# Patient Record
Sex: Female | Born: 1935 | Race: Black or African American | Hispanic: No
Health system: Southern US, Community
[De-identification: ages and names within clinical notes are randomized; demographics above are authoritative.]

## PROBLEM LIST (undated history)

## (undated) DIAGNOSIS — F0150 Vascular dementia without behavioral disturbance: Secondary | ICD-10-CM

## (undated) DIAGNOSIS — I10 Essential (primary) hypertension: Secondary | ICD-10-CM

## (undated) DIAGNOSIS — E785 Hyperlipidemia, unspecified: Secondary | ICD-10-CM

## (undated) DIAGNOSIS — N3941 Urge incontinence: Secondary | ICD-10-CM

## (undated) DIAGNOSIS — E119 Type 2 diabetes mellitus without complications: Secondary | ICD-10-CM

## (undated) DIAGNOSIS — I639 Cerebral infarction, unspecified: Secondary | ICD-10-CM

## (undated) HISTORY — DX: Vascular dementia without behavioral disturbance: F01.50

## (undated) HISTORY — DX: Cerebral infarction, unspecified: I63.9

## (undated) HISTORY — DX: Urge incontinence: N39.41

## (undated) HISTORY — DX: Hyperlipidemia, unspecified: E78.5

---

## 2002-11-11 ENCOUNTER — Emergency Department (HOSPITAL_COMMUNITY): Admission: EM | Admit: 2002-11-11 | Discharge: 2002-11-11 | Payer: Self-pay | Admitting: Emergency Medicine

## 2002-12-07 ENCOUNTER — Encounter: Admission: RE | Admit: 2002-12-07 | Discharge: 2003-01-04 | Payer: Self-pay | Admitting: Orthopedic Surgery

## 2003-01-18 ENCOUNTER — Encounter: Admission: RE | Admit: 2003-01-18 | Discharge: 2003-03-18 | Payer: Self-pay | Admitting: Orthopedic Surgery

## 2007-10-09 ENCOUNTER — Emergency Department (HOSPITAL_COMMUNITY): Admission: EM | Admit: 2007-10-09 | Discharge: 2007-10-09 | Payer: Self-pay | Admitting: Emergency Medicine

## 2009-07-16 ENCOUNTER — Emergency Department (HOSPITAL_COMMUNITY): Admission: EM | Admit: 2009-07-16 | Discharge: 2009-07-16 | Payer: Self-pay | Admitting: Emergency Medicine

## 2010-02-03 ENCOUNTER — Encounter: Payer: Self-pay | Admitting: Internal Medicine

## 2016-02-07 DIAGNOSIS — Z6829 Body mass index (BMI) 29.0-29.9, adult: Secondary | ICD-10-CM | POA: Diagnosis not present

## 2016-02-07 DIAGNOSIS — K08409 Partial loss of teeth, unspecified cause, unspecified class: Secondary | ICD-10-CM | POA: Diagnosis not present

## 2016-02-07 DIAGNOSIS — Z0000 Encounter for general adult medical examination without abnormal findings: Secondary | ICD-10-CM | POA: Diagnosis not present

## 2016-02-07 DIAGNOSIS — I10 Essential (primary) hypertension: Secondary | ICD-10-CM | POA: Diagnosis not present

## 2016-02-07 DIAGNOSIS — G3184 Mild cognitive impairment, so stated: Secondary | ICD-10-CM | POA: Diagnosis not present

## 2016-02-07 DIAGNOSIS — H18413 Arcus senilis, bilateral: Secondary | ICD-10-CM | POA: Diagnosis not present

## 2017-05-13 ENCOUNTER — Emergency Department (HOSPITAL_COMMUNITY): Payer: Medicare HMO

## 2017-05-13 ENCOUNTER — Inpatient Hospital Stay (HOSPITAL_COMMUNITY)
Admission: EM | Admit: 2017-05-13 | Discharge: 2017-05-17 | DRG: 069 | Disposition: A | Discharge status: Skilled Nursing Facility | Payer: Medicare HMO | Attending: Internal Medicine | Admitting: Internal Medicine

## 2017-05-13 ENCOUNTER — Encounter (HOSPITAL_COMMUNITY): Payer: Self-pay

## 2017-05-13 DIAGNOSIS — Z9114 Patient's other noncompliance with medication regimen: Secondary | ICD-10-CM

## 2017-05-13 DIAGNOSIS — R410 Disorientation, unspecified: Secondary | ICD-10-CM | POA: Diagnosis not present

## 2017-05-13 DIAGNOSIS — E1122 Type 2 diabetes mellitus with diabetic chronic kidney disease: Secondary | ICD-10-CM | POA: Diagnosis present

## 2017-05-13 DIAGNOSIS — E876 Hypokalemia: Secondary | ICD-10-CM | POA: Diagnosis not present

## 2017-05-13 DIAGNOSIS — R1312 Dysphagia, oropharyngeal phase: Secondary | ICD-10-CM | POA: Diagnosis not present

## 2017-05-13 DIAGNOSIS — R531 Weakness: Secondary | ICD-10-CM

## 2017-05-13 DIAGNOSIS — R2689 Other abnormalities of gait and mobility: Secondary | ICD-10-CM | POA: Diagnosis not present

## 2017-05-13 DIAGNOSIS — M4802 Spinal stenosis, cervical region: Secondary | ICD-10-CM | POA: Diagnosis not present

## 2017-05-13 DIAGNOSIS — G459 Transient cerebral ischemic attack, unspecified: Principal | ICD-10-CM | POA: Diagnosis present

## 2017-05-13 DIAGNOSIS — Z743 Need for continuous supervision: Secondary | ICD-10-CM | POA: Diagnosis not present

## 2017-05-13 DIAGNOSIS — N183 Chronic kidney disease, stage 3 unspecified: Secondary | ICD-10-CM | POA: Diagnosis present

## 2017-05-13 DIAGNOSIS — G8194 Hemiplegia, unspecified affecting left nondominant side: Secondary | ICD-10-CM | POA: Diagnosis not present

## 2017-05-13 DIAGNOSIS — I129 Hypertensive chronic kidney disease with stage 1 through stage 4 chronic kidney disease, or unspecified chronic kidney disease: Secondary | ICD-10-CM | POA: Diagnosis present

## 2017-05-13 DIAGNOSIS — I6300 Cerebral infarction due to thrombosis of unspecified precerebral artery: Secondary | ICD-10-CM | POA: Diagnosis not present

## 2017-05-13 DIAGNOSIS — I361 Nonrheumatic tricuspid (valve) insufficiency: Secondary | ICD-10-CM | POA: Diagnosis not present

## 2017-05-13 DIAGNOSIS — I10 Essential (primary) hypertension: Secondary | ICD-10-CM | POA: Diagnosis present

## 2017-05-13 DIAGNOSIS — R41841 Cognitive communication deficit: Secondary | ICD-10-CM | POA: Diagnosis not present

## 2017-05-13 DIAGNOSIS — G9341 Metabolic encephalopathy: Secondary | ICD-10-CM | POA: Diagnosis present

## 2017-05-13 DIAGNOSIS — E1140 Type 2 diabetes mellitus with diabetic neuropathy, unspecified: Secondary | ICD-10-CM | POA: Diagnosis not present

## 2017-05-13 DIAGNOSIS — I63311 Cerebral infarction due to thrombosis of right middle cerebral artery: Secondary | ICD-10-CM | POA: Diagnosis not present

## 2017-05-13 DIAGNOSIS — R279 Unspecified lack of coordination: Secondary | ICD-10-CM | POA: Diagnosis not present

## 2017-05-13 DIAGNOSIS — E1159 Type 2 diabetes mellitus with other circulatory complications: Secondary | ICD-10-CM | POA: Diagnosis not present

## 2017-05-13 DIAGNOSIS — E1165 Type 2 diabetes mellitus with hyperglycemia: Secondary | ICD-10-CM | POA: Diagnosis not present

## 2017-05-13 DIAGNOSIS — I639 Cerebral infarction, unspecified: Secondary | ICD-10-CM

## 2017-05-13 DIAGNOSIS — M6281 Muscle weakness (generalized): Secondary | ICD-10-CM | POA: Diagnosis not present

## 2017-05-13 DIAGNOSIS — M5023 Other cervical disc displacement, cervicothoracic region: Secondary | ICD-10-CM | POA: Diagnosis not present

## 2017-05-13 DIAGNOSIS — E1121 Type 2 diabetes mellitus with diabetic nephropathy: Secondary | ICD-10-CM | POA: Diagnosis not present

## 2017-05-13 HISTORY — DX: Type 2 diabetes mellitus without complications: E11.9

## 2017-05-13 HISTORY — DX: Essential (primary) hypertension: I10

## 2017-05-13 LAB — URINALYSIS, ROUTINE W REFLEX MICROSCOPIC
Glucose, UA: NEGATIVE mg/dL
Hgb urine dipstick: NEGATIVE
Ketones, ur: 20 mg/dL — AB
Nitrite: NEGATIVE
Protein, ur: 100 mg/dL — AB
Specific Gravity, Urine: 1.025 (ref 1.005–1.030)
pH: 5 (ref 5.0–8.0)

## 2017-05-13 LAB — CBC WITH DIFFERENTIAL/PLATELET
Basophils Absolute: 0 10*3/uL (ref 0.0–0.1)
Basophils Relative: 0 %
Eosinophils Absolute: 0 10*3/uL (ref 0.0–0.7)
Eosinophils Relative: 0 %
HCT: 37.5 % (ref 36.0–46.0)
Hemoglobin: 12.1 g/dL (ref 12.0–15.0)
Lymphocytes Relative: 25 %
Lymphs Abs: 1.7 10*3/uL (ref 0.7–4.0)
MCH: 26.2 pg (ref 26.0–34.0)
MCHC: 32.3 g/dL (ref 30.0–36.0)
MCV: 81.3 fL (ref 78.0–100.0)
Monocytes Absolute: 0.4 10*3/uL (ref 0.1–1.0)
Monocytes Relative: 7 %
Neutro Abs: 4.5 10*3/uL (ref 1.7–7.7)
Neutrophils Relative %: 68 %
Platelets: 224 10*3/uL (ref 150–400)
RBC: 4.61 MIL/uL (ref 3.87–5.11)
RDW: 14.5 % (ref 11.5–15.5)
WBC: 6.7 10*3/uL (ref 4.0–10.5)

## 2017-05-13 LAB — BASIC METABOLIC PANEL
Anion gap: 14 (ref 5–15)
BUN: 28 mg/dL — ABNORMAL HIGH (ref 6–20)
CO2: 24 mmol/L (ref 22–32)
Calcium: 10 mg/dL (ref 8.9–10.3)
Chloride: 102 mmol/L (ref 101–111)
Creatinine, Ser: 1.2 mg/dL — ABNORMAL HIGH (ref 0.44–1.00)
GFR calc Af Amer: 46 mL/min — ABNORMAL LOW (ref >60)
GFR calc non Af Amer: 39 mL/min — ABNORMAL LOW (ref >60)
Glucose, Bld: 246 mg/dL — ABNORMAL HIGH (ref 65–99)
Potassium: 3.2 mmol/L — ABNORMAL LOW (ref 3.5–5.1)
Sodium: 140 mmol/L (ref 135–145)

## 2017-05-13 LAB — I-STAT TROPONIN, ED: Troponin i, poc: 0.04 ng/mL (ref 0.00–0.08)

## 2017-05-13 MED ORDER — SODIUM CHLORIDE 0.9 % IV BOLUS
500.0000 mL | Freq: Once | INTRAVENOUS | Status: AC
  Administered 2017-05-14: 500 mL via INTRAVENOUS

## 2017-05-13 NOTE — ED Provider Notes (Signed)
TIME SEEN: 11:29 PM  CHIEF COMPLAINT: Weakness  HPI: Patient is an 84 year old female with history of hypertension, diabetes who has been medically noncompliant off medications for the past 5 years who presents to the emergency department with left-sided weakness that family noticed today.  Family noticed her favoring her left arm and had difficulty getting herself up and walking due to left leg weakness.  They felt that she was confused around noon today.  Daughter reports she last saw her about a week ago when she was normal.  Patient denies any recent fevers, cough, vomiting.  Did have some diarrhea today after lunch.  No chest pain or shortness of breath.  No headache or head injury.  No previous history of stroke.  ROS: See HPI Constitutional: no fever  Eyes: no drainage  ENT: no runny nose   Cardiovascular:  no chest pain  Resp: no SOB  GI: no vomiting GU: no dysuria Integumentary: no rash  Allergy: no hives  Musculoskeletal: no leg swelling  Neurological: no slurred speech ROS otherwise negative  PAST MEDICAL HISTORY/PAST SURGICAL HISTORY:  Past Medical History:  Diagnosis Date  . Diabetes mellitus without complication (HCC)   . Hypertension     MEDICATIONS:  Prior to Admission medications   Not on File    ALLERGIES:  No Known Allergies  SOCIAL HISTORY:  Social History   Tobacco Use  . Smoking status: Never Smoker  . Smokeless tobacco: Never Used  Substance Use Topics  . Alcohol use: Not on file    FAMILY HISTORY: History reviewed. No pertinent family history.  EXAM: BP (!) 157/76 (BP Location: Right Arm)   Pulse 82   Temp 97.8 F (36.6 C) (Oral)   Resp 17   Ht 5' (1.524 m)   SpO2 99%  CONSTITUTIONAL: Alert and oriented and responds appropriately to questions.  Elderly HEAD: Normocephalic EYES: Conjunctivae clear, pupils appear equal, EOMI ENT: normal nose; moist mucous membranes NECK: Supple, no meningismus, no nuchal rigidity, no LAD  CARD: RRR;  S1 and S2 appreciated; no murmurs, no clicks, no rubs, no gallops RESP: Normal chest excursion without splinting or tachypnea; breath sounds clear and equal bilaterally; no wheezes, no rhonchi, no rales, no hypoxia or respiratory distress, speaking full sentences ABD/GI: Normal bowel sounds; non-distended; soft, non-tender, no rebound, no guarding, no peritoneal signs, no hepatosplenomegaly BACK:  The back appears normal and is non-tender to palpation, there is no CVA tenderness EXT: Normal ROM in all joints; non-tender to palpation; no edema; normal capillary refill; no cyanosis, no calf tenderness or swelling    SKIN: Normal color for age and race; warm; no rash NEURO: Moves all extremities equally, strength diminished in the left upper and lower extremity compared to the right, cranial nerves II through XII intact, normal speech, oriented x3 PSYCH: The patient's mood and manner are appropriate. Grooming and personal hygiene are appropriate.  MEDICAL DECISION MAKING: Patient here with left-sided weakness.  Last seen normal by family was 1 week ago.  No history of previous stroke.  Stroke scale is 2.  Outside TPA window.  Labs obtained in triage unremarkable other than mildly elevated creatinine and some ketones in her urine.  Will give IV fluids for mild dehydration.  Head CT negative.  EKG shows no ischemic abnormality or arrhythmia.  I feel she will need admission for MRI of her brain and neurologic evaluation.  Concern for possible stroke.  ED PROGRESS:   12:20 AM  D/w Dr. Wilford Corner with neurology who  will see patient in consult.  Will admit to medicine.   12:50 AM Discussed patient's case with hospitalist, Dr. Willette Pa.  I have recommended admission and patient (and family if present) agree with this plan. Admitting physician will place admission orders.   I reviewed all nursing notes, vitals, pertinent previous records, EKGs, lab and urine results, imaging (as available).     EKG  Interpretation  Date/Time:  Tuesday May 13 2017 19:39:38 EDT Ventricular Rate:  91 PR Interval:  158 QRS Duration: 74 QT Interval:  382 QTC Calculation: 469 R Axis:   -12 Text Interpretation:  Normal sinus rhythm Minimal voltage criteria for LVH, may be normal variant Inferior infarct , age undetermined Anterior infarct , age undetermined Abnormal ECG No significant change since last tracing Artifact Confirmed by Rochele Raring [phone]) on 05/13/2017 11:15:14 PM         Ward, Layla Maw, DO 05/14/17 1191

## 2017-05-13 NOTE — ED Notes (Signed)
Pt. Currently in CT

## 2017-05-13 NOTE — ED Provider Notes (Signed)
Patient placed in Quick Look pathway, seen and evaluated   Chief Complaint: Weakness.   HPI:   84 y.o. F in by granddaughter for evaluation of weakness.  Granddaughter states that she picked up patient at approximately 12 PM today.  Granddaughter unsure when patient was last normal and she has not seen her in the last few days.  She states that since then, patient has had difficulty walking and using her left lower extremity.  Additionally, she noticed some swelling of the left hand and noticed that patient was not using the left hand as she normally would.  Granddaughter reports that at baseline, patient can bill it without any difficulty.  She reports that patient has been required more assistance today with regular activities which she states is abnormal also.  Patient denies any complaints at this time.  Patient denies any chest pain, difficulty breathing, numbness of arms or legs, vision changes.  ROS: Numbness  Physical Exam:   Gen: No distress  Neuro: Awake and Alert  Skin: Warm    Focused Exam:   Neuro:  Cranial nerves III-XII intact  Follows commands, Moves all extremities   5/5 strength to RUE and BLE.  Slightly diminished strength noted to left upper extremity.  Sensation intact throughout all major nerve distributions  Normal finger to nose.  No dysdiadochokinesia.  No pronator drift.  No slurred speech. No facial droop.   Given that patient's symptoms have been going on 12 PM and we do not know last seen normal, does not qualify for code stroke at this time.  Initiation of care has begun. The patient has been counseled on the process, plan, and necessity for staying for the completion/evaluation, and the remainder of the medical screening examination.   Maxwell Caul, PA-C 05/13/17 2016    Marily Memos, MD 05/14/17 1610

## 2017-05-13 NOTE — ED Triage Notes (Signed)
Pt presents with onset of weakness noted by granddaughter today. Pt normally able to walk but today has had to have assistance. Granddaughter reports confusion.

## 2017-05-14 ENCOUNTER — Observation Stay (HOSPITAL_COMMUNITY): Payer: Medicare HMO

## 2017-05-14 ENCOUNTER — Other Ambulatory Visit: Payer: Self-pay

## 2017-05-14 ENCOUNTER — Observation Stay (HOSPITAL_BASED_OUTPATIENT_CLINIC_OR_DEPARTMENT_OTHER): Payer: Medicare HMO

## 2017-05-14 ENCOUNTER — Encounter (HOSPITAL_COMMUNITY): Payer: Self-pay | Admitting: Internal Medicine

## 2017-05-14 DIAGNOSIS — M5023 Other cervical disc displacement, cervicothoracic region: Secondary | ICD-10-CM | POA: Diagnosis not present

## 2017-05-14 DIAGNOSIS — I10 Essential (primary) hypertension: Secondary | ICD-10-CM | POA: Diagnosis present

## 2017-05-14 DIAGNOSIS — R410 Disorientation, unspecified: Secondary | ICD-10-CM | POA: Diagnosis not present

## 2017-05-14 DIAGNOSIS — M4802 Spinal stenosis, cervical region: Secondary | ICD-10-CM | POA: Diagnosis not present

## 2017-05-14 DIAGNOSIS — I639 Cerebral infarction, unspecified: Secondary | ICD-10-CM

## 2017-05-14 DIAGNOSIS — G9341 Metabolic encephalopathy: Secondary | ICD-10-CM | POA: Diagnosis present

## 2017-05-14 DIAGNOSIS — E876 Hypokalemia: Secondary | ICD-10-CM | POA: Diagnosis present

## 2017-05-14 DIAGNOSIS — I63311 Cerebral infarction due to thrombosis of right middle cerebral artery: Secondary | ICD-10-CM | POA: Diagnosis not present

## 2017-05-14 DIAGNOSIS — I361 Nonrheumatic tricuspid (valve) insufficiency: Secondary | ICD-10-CM

## 2017-05-14 DIAGNOSIS — R531 Weakness: Secondary | ICD-10-CM

## 2017-05-14 DIAGNOSIS — E1159 Type 2 diabetes mellitus with other circulatory complications: Secondary | ICD-10-CM | POA: Diagnosis present

## 2017-05-14 DIAGNOSIS — E1122 Type 2 diabetes mellitus with diabetic chronic kidney disease: Secondary | ICD-10-CM | POA: Diagnosis present

## 2017-05-14 DIAGNOSIS — I6300 Cerebral infarction due to thrombosis of unspecified precerebral artery: Secondary | ICD-10-CM

## 2017-05-14 DIAGNOSIS — N183 Chronic kidney disease, stage 3 (moderate): Secondary | ICD-10-CM

## 2017-05-14 HISTORY — DX: Cerebral infarction, unspecified: I63.9

## 2017-05-14 LAB — CBC
HCT: 36.8 % (ref 36.0–46.0)
Hemoglobin: 11.8 g/dL — ABNORMAL LOW (ref 12.0–15.0)
MCH: 26.2 pg (ref 26.0–34.0)
MCHC: 32.1 g/dL (ref 30.0–36.0)
MCV: 81.6 fL (ref 78.0–100.0)
PLATELETS: 206 10*3/uL (ref 150–400)
RBC: 4.51 MIL/uL (ref 3.87–5.11)
RDW: 14.2 % (ref 11.5–15.5)
WBC: 6.9 10*3/uL (ref 4.0–10.5)

## 2017-05-14 LAB — LIPID PANEL
Cholesterol: 242 mg/dL — ABNORMAL HIGH (ref 0–200)
HDL: 47 mg/dL (ref >40)
LDL CALC: 175 mg/dL — AB (ref 0–99)
Total CHOL/HDL Ratio: 5.1 RATIO
Triglycerides: 99 mg/dL (ref <150)
VLDL: 20 mg/dL (ref 0–40)

## 2017-05-14 LAB — HEMOGLOBIN A1C
Hgb A1c MFr Bld: 6.5 % — ABNORMAL HIGH (ref 4.8–5.6)
Mean Plasma Glucose: 139.85 mg/dL

## 2017-05-14 LAB — GLUCOSE, CAPILLARY
GLUCOSE-CAPILLARY: 134 mg/dL — AB (ref 65–99)
Glucose-Capillary: 130 mg/dL — ABNORMAL HIGH (ref 65–99)
Glucose-Capillary: 117 mg/dL — ABNORMAL HIGH (ref 65–99)

## 2017-05-14 LAB — CREATININE, SERUM
Creatinine, Ser: 1.22 mg/dL — ABNORMAL HIGH (ref 0.44–1.00)
GFR calc Af Amer: 45 mL/min — ABNORMAL LOW (ref >60)
GFR calc non Af Amer: 39 mL/min — ABNORMAL LOW (ref >60)

## 2017-05-14 LAB — ECHOCARDIOGRAM COMPLETE
Height: 60 in
WEIGHTICAEL: 2074.09 [oz_av]

## 2017-05-14 MED ORDER — INSULIN ASPART 100 UNIT/ML ~~LOC~~ SOLN
0.0000 [IU] | Freq: Three times a day (TID) | SUBCUTANEOUS | Status: DC
  Administered 2017-05-14 – 2017-05-16 (×5): 2 [IU] via SUBCUTANEOUS

## 2017-05-14 MED ORDER — SODIUM CHLORIDE 0.9 % IV SOLN
INTRAVENOUS | Status: DC
  Administered 2017-05-14 (×2): via INTRAVENOUS

## 2017-05-14 MED ORDER — ATORVASTATIN CALCIUM 40 MG PO TABS
40.0000 mg | ORAL_TABLET | Freq: Every day | ORAL | Status: DC
  Administered 2017-05-14 – 2017-05-15 (×2): 40 mg via ORAL
  Filled 2017-05-14 (×2): qty 1

## 2017-05-14 MED ORDER — ATORVASTATIN CALCIUM 10 MG PO TABS: 10.0000 mg | ORAL_TABLET | Freq: Every day | ORAL | Status: DC

## 2017-05-14 MED ORDER — STROKE: EARLY STAGES OF RECOVERY BOOK
Freq: Once | Status: AC
  Administered 2017-05-14: 04:00:00
  Administered 2017-05-15: 1
  Filled 2017-05-14: qty 1

## 2017-05-14 MED ORDER — SENNOSIDES-DOCUSATE SODIUM 8.6-50 MG PO TABS: 1.0000 | ORAL_TABLET | Freq: Every evening | ORAL | Status: DC | PRN

## 2017-05-14 MED ORDER — ASPIRIN 325 MG PO TABS
325.0000 mg | ORAL_TABLET | Freq: Every day | ORAL | Status: DC
  Administered 2017-05-14 – 2017-05-17 (×4): 325 mg via ORAL
  Filled 2017-05-14 (×3): qty 1

## 2017-05-14 MED ORDER — INSULIN ASPART 100 UNIT/ML ~~LOC~~ SOLN
3.0000 [IU] | Freq: Three times a day (TID) | SUBCUTANEOUS | Status: DC
  Administered 2017-05-14 – 2017-05-17 (×10): 3 [IU] via SUBCUTANEOUS

## 2017-05-14 MED ORDER — ACETAMINOPHEN 650 MG RE SUPP: 650.0000 mg | RECTAL | Status: DC | PRN

## 2017-05-14 MED ORDER — ACETAMINOPHEN 160 MG/5ML PO SOLN: 650.0000 mg | ORAL | Status: DC | PRN

## 2017-05-14 MED ORDER — ENOXAPARIN SODIUM 40 MG/0.4ML ~~LOC~~ SOLN
40.0000 mg | SUBCUTANEOUS | Status: DC
  Administered 2017-05-14 – 2017-05-16 (×3): 40 mg via SUBCUTANEOUS
  Filled 2017-05-14 (×3): qty 0.4

## 2017-05-14 MED ORDER — ACETAMINOPHEN 325 MG PO TABS: 650.0000 mg | ORAL_TABLET | ORAL | Status: DC | PRN

## 2017-05-14 NOTE — ED Notes (Signed)
Patient transported to MRI 

## 2017-05-14 NOTE — Evaluation (Signed)
Occupational Therapy Evaluation Patient Details Name: Mary Walters MRN: [id number] DOB: Dec 09, 1928 Today's Date: 05/14/2017    History of Present Illness 84 y.o. female with medical history significant of diabetes and hypertension who presents to the ED on 4/30 brought in by her daughter and grandchildren due to new left-sided leg weakness and confusion. 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 At baseline pt reports being independent with ADLs and functional mobility, reports she lives alone. Pt requiring minA for functional mobility this session using HHA and RW; requires MinA for toileting and LB ADLs. Pt presenting with LUE deficits, is easily distracted and perseverative during session, requiring frequent cues for redirection to task; pt also requiring cues for initiating functional tasks and transfers. Pt will benefit from continued acute OT services and recommend additional OT services in SNF setting prior to return home to maximize pt's overall safety and independence with ADLs and mobility.      Follow Up Recommendations  SNF;Supervision/Assistance - 24 hour    Equipment Recommendations  Other (comment)(TBD in next venue )           Precautions / Restrictions Precautions Precautions: Fall Restrictions Weight Bearing Restrictions: No      Mobility Bed Mobility Overal bed mobility: Needs Assistance Bed Mobility: Supine to Sit     Supine to sit: Min guard     General bed mobility comments: increased time to perform, multiple cues required to direct to task. no physical assist needed  Transfers Overall transfer level: Needs assistance Equipment used: 1 person hand held assist Transfers: Sit to/from Stand Sit to Stand: Min assist         General transfer comment: min assist with noted instability upon standing    Balance  Overall balance assessment: Needs assistance   Sitting balance-Leahy Scale: Fair Sitting balance - Comments: able to sit EOB but noted posterior bias requring multiple cues to come to EOB Postural control: Posterior lean   Standing balance-Leahy Scale: Poor Standing balance comment: relaince on physical assist and upper extremity support             High level balance activites: Direction changes High Level Balance Comments: assist for truns min to moderate assist           ADL either performed or assessed with clinical judgement   ADL Overall ADL's : Needs assistance/impaired Eating/Feeding: Set up;Sitting   Grooming: Wash/dry hands;Min guard;Minimal assistance;Standing Grooming Details (indicate cue type and reason): cues for initiating and terminating task  Upper Body Bathing: Min guard;Sitting   Lower Body Bathing: Minimal assistance;Sit to/from stand   Upper Body Dressing : Min guard;Sitting   Lower Body Dressing: Minimal assistance;Sit to/from stand Lower Body Dressing Details (indicate cue type and reason): pt able to reach and adjust socks seated EOB, increased time  Toilet Transfer: Minimal assistance;Ambulation;Regular Toilet;Grab bars;RW Statistician Details (indicate cue type and reason): pt with decreased initiation, standing in front of toilet but requiring max cues and repetition to sit on toilet  Toileting- Clothing Manipulation and Hygiene: Minimal assistance;Sit to/from stand Toileting - Clothing Manipulation Details (indicate cue type and reason): steadying assist in standing while pt performs peri-care      Functional mobility during ADLs: Min guard;Minimal assistance;Rolling walker General ADL Comments: pt requires cues due to decreased initiation and easily distracted/perseverative; questionable visual deficits/inattention; use of HHA in room for mobility  and RW when entering hallway for further distance      Vision Baseline Vision/History: No  visual deficits Patient Visual Report: No change from baseline Vision Assessment?: Vision impaired- to be further tested in functional context Additional Comments: to be further assessed; pt bumping into counter with RW/hand when rounding corners, requiring cues to avoid, though doing so to both L/R side at different times                Pertinent Vitals/Pain Pain Assessment: No/denies pain     Hand Dominance Left   Extremity/Trunk Assessment Upper Extremity Assessment Upper Extremity Assessment: Generalized weakness;LUE deficits/detail RUE Coordination: decreased fine motor;decreased gross motor LUE Deficits / Details: LUE with noted weakness and sensory deficits compared to RUE LUE Sensation: decreased light touch LUE Coordination: decreased fine motor;decreased gross motor   Lower Extremity Assessment Lower Extremity Assessment: Defer to PT evaluation LLE Deficits / Details: overall generalized weakness but symmetrical strength LLE Sensation: decreased light touch   Cervical / Trunk Assessment Cervical / Trunk Assessment: Kyphotic   Communication Communication Communication: No difficulties   Cognition Arousal/Alertness: Awake/alert Behavior During Therapy: WFL for tasks assessed/performed Overall Cognitive Status: Impaired/Different from baseline Area of Impairment: Attention;Memory;Awareness;Problem solving;Safety/judgement;Following commands                   Current Attention Level: Selective Memory: Decreased short-term memory Following Commands: Follows multi-step commands inconsistently   Awareness: Emergent Problem Solving: Requires verbal cues;Requires tactile cues General Comments: easily distracted and tangential during activitiy. perseverative throughout session   General Comments                  Home Living Family/patient expects to be discharged to:: Private residence Living Arrangements: Alone Available Help at Discharge:  Family;Available PRN/intermittently(around most of the time ) Type of Home: Apartment Home Access: Level entry;Elevator           Bathroom Shower/Tub: Tub/shower unit;Curtain   Bathroom Toilet: Standard     Home Equipment: None          Prior Functioning/Environment Level of Independence: Independent        Comments: reports living alone, no assistive device does her own cooking etc. questionable historian as no famiyl or caregiver present to confirm        OT Problem List: Decreased strength;Impaired balance (sitting and/or standing);Decreased cognition;Decreased activity tolerance;Decreased knowledge of use of DME or AE;Decreased safety awareness      OT Treatment/Interventions: DME and/or AE instruction;Therapeutic activities;Balance training;Self-care/ADL training;Therapeutic exercise;Patient/family education;Visual/perceptual remediation/compensation;Cognitive remediation/compensation    OT Goals(Current goals can be found in the care plan section) Acute Rehab OT Goals Patient Stated Goal: regain independence OT Goal Formulation: With patient Time For Goal Achievement: 05/28/17 Potential to Achieve Goals: Good  OT Frequency: Min 2X/week               Co-evaluation PT/OT/SLP Co-Evaluation/Treatment: Yes Reason for Co-Treatment: For patient/therapist safety;To address functional/ADL transfers   OT goals addressed during session: ADL's and self-care;Proper use of Adaptive equipment and DME      AM-PAC PT "6 Clicks" Daily Activity     Outcome Measure Help from another person eating meals?: None Help from another person taking care of personal grooming?: A Little Help from another person toileting, which includes using toliet, bedpan, or urinal?: A Little Help from another person bathing (including washing, rinsing, drying)?: A Lot Help from another person to put on and taking off regular upper body clothing?: A Little Help from another person  to put on and  taking off regular lower body clothing?: A Lot 6 Click Score: 17   End of Session Equipment Utilized During Treatment: Gait belt;Rolling walker Nurse Communication: Mobility status  Activity Tolerance: Patient tolerated treatment well Patient left: in chair;with call bell/phone within reach;with chair alarm set  OT Visit Diagnosis: Unsteadiness on feet (R26.81);Muscle weakness (generalized) (M62.81);Other symptoms and signs involving cognitive function                Time: 1025-1055 OT Time Calculation (min): 30 min Charges:  OT General Charges $OT Visit: 1 Visit OT Evaluation $OT Eval Moderate Complexity: 1 Mod G-Codes:     Marcy Siren, OT Pager [phone] 05/14/2017  Orlando Penner 05/14/2017, 1:24 PM

## 2017-05-14 NOTE — Consult Note (Addendum)
Neurology Consultation  Reason for Consult: Left-sided weakness Referring Physician: Dr. Leonides Schanz  CC: Confusion, weakness  History is obtained from: Chart, patient's grandchildren at bedside, patient  HPI: Mary Walters is a 84 y.o. female who has not seen a doctor in many years has a past medical history of diabetes and hypertension currently not on any medications, who was brought in by her daughter, who saw the patient today and noticed that she had difficulty using her left arm and difficulty ambulating. Patient's daughter had spoken with the EDP prior to this consultation was called and was not at the bedside at the time of this encounter.  HER-2 grandchildren, 1 of whom works in the hospital, were present at bedside to provide some history.  They do not live with the patient.  She lives alone. She reports that she has had difficulty with using the left side of her body for many many months and always feels like she needs support on the left side and doing things like getting out of the car and going on stairs. Family members report that she was last seen normal by the family about a week ago.  They could not tell me a clear last known well time. The patient was picked up by her daughter today who noticed that she has been having trouble with the left side of her body and also that she appeared confused.  This change was noted around noon today, but again she had not been seen by family for about a week, so there is no clear last known well. The patient at this time denies any tingling or numbness.  She denies any headache or visual changes.  She denies any chest pain, palpitations, shortness of breath, nausea or vomiting.  She denies any preceding flulike illnesses or fevers or chills.  She denies any urinary frequency urgency.  She denies any GI upsets.  She does admit to not drinking as much water as she should. She says she is not the one to take medications and has not seen a doctor in at  least 5 years.  She was at one point on antihypertensives but has not taken medications for many many years now. Of note, the grandson notes that the patient has had problems with short-term memory for many years now although her long-term memory seems intact.  LKW: At least 7 days prior to presentation tpa given?: no, outside the window Premorbid modified Rankin scale (mRS): 2  ROS: Review of systems was performed and is negative except as noted in the HPI.  Past Medical History:  Diagnosis Date  . Diabetes mellitus without complication ([facility])   . Hypertension    History reviewed. No pertinent family history.   Social History:   reports that she has never smoked. She has never used smokeless tobacco. Her alcohol and drug histories are not on file. No alcohol, tobacco or illicit drug use.  Medications  Current Facility-Administered Medications:  .  sodium chloride 0.9 % bolus 500 mL, 500 mL, Intravenous, Once, Ward, Kristen N, DO, Last Rate: 500 mL/hr at 05/14/17 0025, 500 mL at 05/14/17 0025 No current outpatient medications on file. not on any medications.  Does not take antiplatelets or statins.  Exam: Current vital signs: BP (!) 144/76   Pulse 88   Temp 97.8 F (36.6 C) (Oral)   Resp (!) 27   Ht 5' (1.524 m)   SpO2 100%  Vital signs in last 24 hours: Temp:  [97.8 F (36.6  C)-99.2 F (37.3 C)] 97.8 F (36.6 C) (04/30 2318) Pulse Rate:  [74-94] 88 (05/01 0000) Resp:  [14-27] 27 (05/01 0000) BP: (144-157)/(72-98) 144/76 (05/01 0000) SpO2:  [96 %-100 %] 100 % (05/01 0000)  GENERAL: Awake, alert in NAD HEENT: - Normocephalic and atraumatic, dry mm, no LN++, no Thyromegally.  Poor oral hygiene secondary to poor dentition. LUNGS - Clear to auscultation bilaterally with no wheezes CV - S1S2 RRR, no m/r/g, equal pulses bilaterally. ABDOMEN - Soft, nontender, nondistended with normoactive BS Ext: warm, well perfused, intact peripheral pulses, trace edema left lower  extremity.  Poorly kempt and overgrown nails in both feet. NEURO:  Mental Status: Patient is awake, alert, oriented to place, time and person. Language: speech is mildly dysarthric.  Naming, repetition, fluency, and comprehension intact.  Attention and concentration is mildly reduced.  She keeps perseverating the fact that she has had problems with the left side of her body and always keeps looking for support on that side. Cranial Nerves: PERRL. EOMI, visual fields full, no facial asymmetry, facial sensation intact, hearing intact, tongue/uvula/soft palate midline, normal sternocleidomastoid and trapezius muscle strength. No evidence of tongue atrophy or fibrillations Motor: 4/5 left upper and left lower extremity with minimal vertical drift.  4+/5 right upper and right lower extremity with no vertical drift. Tone: is normal and bulk is normal Sensation- Intact to light touch bilaterally Coordination: FTN intact bilaterally, no ataxia in BLE. Gait- deferred  NIHSS 1a Level of Conscious.: 0 1b LOC Questions: 0 1c LOC Commands: 0 2 Best Gaze: 0 3 Visual: 0 4 Facial Palsy: 0 5a Motor Arm - left: 1 5b Motor Arm - Right: 0 6a Motor Leg - Left: 1 6b Motor Leg - Right: 0 7 Limb Ataxia: 0 8 Sensory: 0 9 Best Language: 0 10 Dysarthria: 1 11 Extinct. and Inatten.: 0 TOTAL: 3  Labs I have reviewed labs in epic and the results pertinent to this consultation are: Elevated glucose, BUN and creatinine.  Reduced GFR.  CBC    Component Value Date/Time   WBC 6.7 05/13/2017 2031   RBC 4.61 05/13/2017 2031   HGB 12.1 05/13/2017 2031   HCT 37.5 05/13/2017 2031   PLT 224 05/13/2017 2031   MCV 81.3 05/13/2017 2031   MCH 26.2 05/13/2017 2031   MCHC 32.3 05/13/2017 2031   RDW 14.5 05/13/2017 2031   LYMPHSABS 1.7 05/13/2017 2031   MONOABS 0.4 05/13/2017 2031   EOSABS 0.0 05/13/2017 2031   BASOSABS 0.0 05/13/2017 2031    CMP     Component Value Date/Time   NA 140 05/13/2017 2031   K 3.2  (L) 05/13/2017 2031   CL 102 05/13/2017 2031   CO2 24 05/13/2017 2031   GLUCOSE 246 (H) 05/13/2017 2031   BUN 28 (H) 05/13/2017 2031   CREATININE 1.20 (H) 05/13/2017 2031   CALCIUM 10.0 05/13/2017 2031   GFRNONAA 39 (L) 05/13/2017 2031   GFRAA 46 (L) 05/13/2017 2031     Imaging I have reviewed the images obtained:  CT-scan of the brain-no acute changes.  Age-indeterminate lacunar in the right thalamus.  Chronic white matter changes.  Assessment:  A very pleasant 84 year old woman, who has not seen a physician in many years, has a past history of diabetes and hypertension currently not on any medications, brought in when family got concerned for confusion and weakness on the left side. Best last seen well at least a week ago, hence not a candidate for IV TPA.  Also no  cortical signs on exam, hence not a candidate for endovascular treatment. On examination, she has mild left hemiparesis with no facial involvement and no sensory involvement. Family also provided some history of cognitive decline over the past few months to years, predominantly involving short-term memory. Her exam is not very convincing for an acute stroke given the history and the exam but she does have some left hemiparesis which could be from a lacunar infarct given her risk factors including age, diabetes and hypertension-not on treatment. She did exhibit some signs of perseveration on exam making MCI versus dementia also in the differential. Currently, I would recommend she be admitted for stroke risk factor work-up, and also for possible toxic metabolic encephalopathy.  Impression: Evaluate for stroke/TIA-likely small vessel etiology Evaluate for toxic metabolic encephalopathy due to deranged renal function Evaluate for memory loss  Recommendations: -Admit to hospitalist -Telemetry -2D echo -MRI of the brain without contrast -MRA head and neck without contrast given to range renal function -Frequent  neurochecks -Aspirin 325 p.o. daily -Atorvastatin 80 mg p.o. Daily -Lipid panel, A1c -Check vitamin B12, TSH and RPR as reversible causes for memory loss.  Full dementia evaluation should be done as an outpatient upon discharge. -PT, OT, speech therapy. -N.p.o. unless passes swallow evaluation -Management of toxic metabolic derangements per primary team.  Please page stroke NP/PA/MD (listed on AMION)  from 8am-4 pm as this patient will be followed by the stroke team at this point.  -- Amie Portland, MD [facility] Pager: [phone] If 7pm to 7am, please call on call as listed on AMION.

## 2017-05-14 NOTE — H&P (Signed)
History and Physical    Mary Walters:[id number] DOB: 18-Mar-1928 DOA: 05/13/2017  PCP: No primary care provider on file.  She has not seen a physician in 5 years Patient coming from: Home  I have personally briefly reviewed patient's old medical records in [facility]  Chief Complaint: Left-sided weakness noted at noon today  HPI: Mary Walters is a 84 y.o. female with medical history significant of diabetes and hypertension who has been off her medications for about 5 years because she "heard that they may not help" her presents the emergency department brought in by her daughter and grandchildren due to new left-sided leg weakness and confusion.  The patient was last seen well about a week ago when according to her daughter she was able to ambulate adequately.  At noon today she was picked up by her grandchild to go to lunch and she seemed a little bit confused she was unable to get up and required assistance for that.  She has had some left-sided deficits including weakness of the lower extremity and upper extremity.  She denies any nausea vomiting diarrhea or constipation.  She denies any blurry vision or double vision.  He has significant concerns about requiring assistance to help her get out of the car.  She would like some sort of supportive or assistive device.  She feels that she is having great difficulty moving around.  Running to her all of the symptoms have come on gradually but the family seems to think that there has been a significant change today.  Denies any history of syncope, she has no prior history of atrial fibrillation denies any history of heart attacks or prior strokes.  The patient lives alone.  She denies numbness or tingling.  Headache or visual changes.  She has no chest pain or palpitations no shortness of breath no nausea or vomiting.  She reports not getting a flu shot but did not have any flulike illnesses or fever or chills recently.  Has no urinary  frequency or urgency.  She feels that she could drink more water and does not drink as much as she should but has no history of nausea vomiting or diarrhea recently.  Grandson reports difficulty with her short-term memory.  Her memory appears to be intact.  ED Course: In the emergency department the patient was fully evaluated a CT scan was obtained did not show any acute or subacute stroke and no acute abnormality.  She had mildly progressive atrophy and chronic small vessel white matter ischemic changes.  She was referred to neurology who felt the patient would benefit from admission for stroke evaluation.   Review of Systems: As per HPI otherwise all other systems reviewed and  negative.   Past Medical History:  Diagnosis Date  . Diabetes mellitus without complication (HCC)   . Hypertension     History reviewed. No pertinent surgical history.   reports that she has never smoked. She has never used smokeless tobacco. She reports that she drinks about 1.2 oz of alcohol per week. She reports that she does not use drugs.  No Known Allergies  Family History  Problem Relation Age of Onset  . Hypertension Mother     Medications prior to admission: None in 5 years       Physical Exam:  Constitutional: NAD, calm, comfortable Vitals:   05/13/17 2318 05/13/17 2330 05/13/17 2345 05/14/17 0000  BP: (!) 157/76 (!) 152/72 (!) 146/76 (!) 144/76  Pulse: 82 88  74 88  Resp: 17 14 (!) 23 (!) 27  Temp: 97.8 F (36.6 C)     TempSrc: Oral     SpO2: 99% 99% 100% 100%  Height:       Eyes: PERRL, lids and conjunctivae normal ENMT: Mucous membranes are moist. Posterior pharynx clear of any exudate or lesions.poor dentition with multiple missing teeth Neck: normal, supple, no masses, no thyromegaly Respiratory: clear to auscultation bilaterally, no wheezing, no crackles. Normal respiratory effort. No accessory muscle use.  Cardiovascular: Regular rate and rhythm, no murmurs / rubs / gallops. No  extremity edema. 2+ pedal pulses. No carotid bruits.  Abdomen: no tenderness, no masses palpated. No hepatosplenomegaly. Bowel sounds positive.  Musculoskeletal: no clubbing / cyanosis. No joint deformity upper and lower extremities. Good ROM, no contractures. Normal muscle tone.  Skin: no rashes, lesions, ulcers. No induration Neurologic: CN 2-12 grossly intact. Sensation intact, DTR normal.  4/5 on the right side 3/5 on the left side both upper and lower extremities Psychiatric: Normal judgment and insight. Alert and oriented x 3. Normal mood.   Labs on Admission: I have personally reviewed following labs and imaging studies  CBC: Recent Labs  Lab 05/13/17 2031  WBC 6.7  NEUTROABS 4.5  HGB 12.1  HCT 37.5  MCV 81.3  PLT 224   Basic Metabolic Panel: Recent Labs  Lab 05/13/17 2031  NA 140  K 3.2*  CL 102  CO2 24  GLUCOSE 246*  BUN 28*  CREATININE 1.20*  CALCIUM 10.0   Urine analysis:    Component Value Date/Time   COLORURINE AMBER (A) 05/13/2017 2015   APPEARANCEUR CLOUDY (A) 05/13/2017 2015   LABSPEC 1.025 05/13/2017 2015   PHURINE 5.0 05/13/2017 2015   GLUCOSEU NEGATIVE 05/13/2017 2015   HGBUR NEGATIVE 05/13/2017 2015   BILIRUBINUR SMALL (A) 05/13/2017 2015   KETONESUR 20 (A) 05/13/2017 2015   PROTEINUR 100 (A) 05/13/2017 2015   NITRITE NEGATIVE 05/13/2017 2015   LEUKOCYTESUR TRACE (A) 05/13/2017 2015    Radiological Exams on Admission: Ct Head Wo Contrast  Result Date: 05/13/2017 CLINICAL DATA:  New onset generalized weakness today. EXAM: CT HEAD WITHOUT CONTRAST TECHNIQUE: Contiguous axial images were obtained from the base of the skull through the vertex without intravenous contrast. COMPARISON:  10/09/2007. FINDINGS: Brain: Diffusely enlarged ventricles and subarachnoid spaces. Patchy white matter low density in both cerebral hemispheres. No intracranial hemorrhage, mass lesion or CT evidence of acute infarction. Vascular: No hyperdense vessel or unexpected  calcification. Skull: Normal. Negative for fracture or focal lesion. Sinuses/Orbits: No acute finding. Other: None. IMPRESSION: 1. No acute abnormality. 2. Mildly progressive atrophy and chronic small vessel white matter ischemic changes. Electronically Signed   By: Beckie Salts M.D.   On: 05/13/2017 21:15    EKG: Independently reviewed.  Sinus rhythm with PVCs and LVH and possible inferior and anterior lateral age-indeterminate infarctions.  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  Stroke: Will evaluate for stroke with bilateral carotid Dopplers, MRI, and echocardiogram.  A lipid profile has been ordered and is pending as is a hemoglobin A1c.  I have started aspirin 325 mg p.o. daily as well as low-dose atorvastatin at 10 mg a day given patient's age.  2.  Acute metabolic encephalopathy: Given the patient's hypokalemia and her hyperglycemia this may be causing her to  have some weakness on the left side.  We will replete potassium and get her sugars under control and hopefully can improve the functioning of her left side if this is not in fact a stroke.  3.  Uncontrolled type 2 diabetes mellitus with nephropathy: Patient with a GFR of only 32.  She has stage III chronic kidney disease.  She certainly benefit from better blood glucose control both from a renal standpoint and from a standpoint of protecting her retina.  We will start her on sliding scale insulin coverage and based on her amount of insulin she is requiring we will consider insulin versus oral medications.  We will also use a carbohydrate controlled diet once patient is able to eat and is cleared by speech therapy.  4.  Hypokalemia: This will be repleted both IV with her IV fluids and orally will recheck in a.m.  5.  Hypertension  complicating diabetes: We will currently allow for permissive hypertension.  Will follow blood pressures closely if blood pressures become markedly elevated greater than 190 will consider low-dose medications to bring blood pressures closer to the 170 range.  Patient will likely need blood pressure medications at discharge.    DVT prophylaxis: Lovenox Code Status: Full code Family Communication: Spoke with patient's daughter, 2 granddaughters and grandson all who were present at the time of admission. Disposition Plan: Likely home in 48 hours Consults called: Dr. Jerrell Belfast neurology Admission status: Observation  Lahoma Crocker MD FACP [facility] Pager [phone]  If 7PM-7AM, please contact night-coverage [email or web address] Password [email or web address]  05/14/2017, 1:35 AM

## 2017-05-14 NOTE — Progress Notes (Signed)
Echocardiogram 2D Echocardiogram has been performed.  05/14/2017 1:12 PM Gertie Fey, BS, RVT, RDCS, RDMS

## 2017-05-14 NOTE — Care Management Note (Signed)
Case Management Note  Patient Details  Name: Mary Walters MRN: [id number] Date of Birth: 02/01/1928  Subjective/Objective:    Pt in to r/o CVA. She is from home alone.                Action/Plan: OT recommending SNF. Awaiting PT recs. CM following for d/c disposition.  Expected Discharge Date:  05/15/17               Expected Discharge Plan:  Skilled Nursing Facility  In-House Referral:  Clinical Social Work  Discharge planning Services     Post Acute Care Choice:    Choice offered to:     DME Arranged:    DME Agency:     HH Arranged:    HH Agency:     Status of Service:  In process, will continue to follow  If discussed at Long Length of Stay Meetings, dates discussed:    Additional Comments:  Kermit Balo, RN 05/14/2017, 2:31 PM

## 2017-05-14 NOTE — Progress Notes (Signed)
Carotid duplex prelim: 1-39% ICA stenosis. TCD completed.  Ronin Crager Eunice, RDMS, RVT  

## 2017-05-14 NOTE — Evaluation (Signed)
Speech Language Pathology Evaluation Patient Details Name: Mary Walters MRN: [id number] DOB: [birth year] Today's Date: 05/14/2017 Time: 4098-1191 SLP Time Calculation (min) (ACUTE ONLY): 18 min  Problem List:  Patient Active Problem List   Diagnosis Date Noted  . 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 Hypertension complicating diabetes (HCC) 05/14/2017  . Hypokalemia 05/14/2017  . Acute metabolic encephalopathy 05/14/2017  . Left-sided weakness    Past Medical History:  Past Medical History:  Diagnosis Date  . Diabetes mellitus without complication (HCC)   . Hypertension    Past Surgical History: History reviewed. No pertinent surgical history. HPI:  Mary Walters a 84 y.o.femalewith medical history significant ofdiabetes and hypertension who has been off her medications for about 5 years because she "heard that they may not help" herpresents the emergency department brought in by her daughter and grandchildren due to new left-sided leg weakness and confusion. The patient was last seen well about a week ago when according to her daughter she was able to ambulate adequately. At noon today she was picked up by her grandchild to go to lunch and she seemed a little bit confused she was unable to get up and required assistance for that. She has had some left-sided deficits including weakness of the lower extremity and upper extremity. She denies any nausea vomiting diarrhea or constipation. She denies any blurry vision or double vision. He has significant concerns about requiring assistance to help her get out of the car. She would like some sort of supportive or assistive device. She feels that she is having great difficulty moving around. Running to her all of the symptoms have come on gradually but the family seems to think that there has been a significant change today. Denies any history of  syncope, she has no prior history of atrial fibrillation denies any history of heart attacks or prior strokes. The patient lives alone. She denies numbness or tingling. Headache or visual changes. She has no chest pain or palpitations no shortness of breath no nausea or vomiting. She reports not getting a flu shot but did not have any flulike illnesses or fever or chills recently. Has no urinary frequency or urgency. She feels that she could drink more water and does not drink as much as she should but has no history of nausea vomiting or diarrhea recently. Grandson reports difficulty with her short-term memory. Her memory appears to be intact. 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   Assessment / Plan / Recommendation Clinical Impression  Pt presents with moderate cognitive impairments impacting attention (easily distracted), problem solving, task initiation, intellectual awareness/safety awareness and would benefit from continued acute ST services with SNF recommendation to increase cognitive function and thereby increase safety awareness. This would maximize pt's overall functional independence and reduce caregiver burden.     SLP Assessment  SLP Recommendation/Assessment: Patient needs continued Speech Lanaguage Pathology Services SLP Visit Diagnosis: Cognitive communication deficit (R41.841)    Follow Up Recommendations  Skilled Nursing facility    Frequency and Duration min 2x/week  2 weeks      SLP Evaluation Cognition  Overall Cognitive Status: Impaired/Different from baseline Arousal/Alertness: Awake/alert Orientation Level: Oriented X4 Attention: Selective Selective Attention: Impaired Selective Attention Impairment: Verbal basic;Functional basic Memory: Impaired Memory Impairment: Storage deficit;Decreased recall of new information;Decreased short term memory Decreased  Short Term Memory: Verbal basic;Functional basic Awareness: Impaired Awareness  Impairment: Intellectual impairment;Emergent impairment Problem Solving: Impaired Problem Solving Impairment: Verbal basic;Functional basic Executive Function: Decision Making Decision Making: Impaired Decision Making Impairment: Verbal basic;Functional basic Behaviors: Perseveration;Poor frustration tolerance;Restless Safety/Judgment: Impaired       Comprehension  Auditory Comprehension Overall Auditory Comprehension: Appears within functional limits for tasks assessed Visual Recognition/Discrimination Discrimination: Not tested Reading Comprehension Reading Status: Not tested    Expression Expression Primary Mode of Expression: Verbal Verbal Expression Overall Verbal Expression: Appears within functional limits for tasks assessed Written Expression Dominant Hand: Left Written Expression: Not tested   Oral / Motor  Oral Motor/Sensory Function Overall Oral Motor/Sensory Function: Within functional limits Motor Speech Overall Motor Speech: Appears within functional limits for tasks assessed   GO                    Shiasia Porro 05/14/2017, 2:19 PM

## 2017-05-14 NOTE — Progress Notes (Signed)
Admitted via stretcher from ED after CT & MRI.  Alert, oriented, no pain, speech clear, left arm weak but has grip, no drift.  Oriented to unit, room, plan of care & safety precautions.  Initiated stroke education.

## 2017-05-14 NOTE — Progress Notes (Signed)
Stroke Team Progress Note  Mary Walters is a 84 y.o. female who has not seen a doctor in many years has a past medical history of diabetes and hypertension currently not on any medications, who was brought in by her daughter, who saw the patient today and noticed that she had difficulty using her left arm and difficulty ambulating. Patient's daughter had spoken with the EDP prior to this consultation was called and was not at the bedside at the time of this encounter.  HER-2 grandchildren, 1 of whom works in the hospital, were present at bedside to provide some history.  They do not live with the patient.  She lives alone. She reports that she has had difficulty with using the left side of her body for many many months and always feels like she needs support on the left side and doing things like getting out of the car and going on stairs. Family members report that she was last seen normal by the family about a week ago.  They could not tell me a clear last known well time. The patient was picked up by her daughter today who noticed that she has been having trouble with the left side of her body and also that she appeared confused.  This change was noted around noon today, but again she had not been seen by family for about a week, so there is no clear last known well. The patient at this time denies any tingling or numbness.  She denies any headache or visual changes.  She denies any chest pain, palpitations, shortness of breath, nausea or vomiting.  She denies any preceding flulike illnesses or fevers or chills.  She denies any urinary frequency urgency.  She denies any GI upsets.  She does admit to not drinking as much water as she should. She says she is not the one to take medications and has not seen a doctor in at least 5 years.  She was at one point on antihypertensives but has not taken medications for many many years now. Of note, the grandson notes that the patient has had problems with  short-term memory for many years now although her long-term memory seems intact.  LKW: At least 7 days prior to presentation tpa given?: no, outside the window Premorbid modified Rankin scale (mRS): 2   SUBJECTIVE  patient is alone in the room. She states she is having some memory difficulties over the past few years. She's been having some difficulty walking with leaning to the left side and her family was concerned and brought her to the hospital. She denies any sudden weakness and speech difficulties or known prior history of strokes. Denies significant neck pain or radicular pain. She does admit to a few falls but denies severe neck pain  OBJECTIVE Most recent Vital Signs: Temp: 98.2 F (36.8 C) (05/01 1125) Temp Source: Oral (05/01 0728) BP: 158/90 (05/01 1125) Pulse Rate: 75 (05/01 1125) Respiratory Rate: 20 O2 Saturdation: 100%  CBG (last 3)  Recent Labs    05/14/17 0828  GLUCAP 134*    Diet:  Diet Order           Diet Carb Modified Fluid consistency: Thin; Room service appropriate? Yes  Diet effective now              Activity: Up with assistance   VTE Prophylaxis:  scds  Studies: Results for orders placed or performed during the hospital encounter of 05/13/17 (from the past 24 hour(s))  Urinalysis, Routine w reflex microscopic     Status: Abnormal   Collection Time: 05/13/17  8:15 PM  Result Value Ref Range   Color, Urine AMBER (A) YELLOW   APPearance CLOUDY (A) CLEAR   Specific Gravity, Urine 1.025 1.005 - 1.030   pH 5.0 5.0 - 8.0   Glucose, UA NEGATIVE NEGATIVE mg/dL   Hgb urine dipstick NEGATIVE NEGATIVE   Bilirubin Urine SMALL (A) NEGATIVE   Ketones, ur 20 (A) NEGATIVE mg/dL   Protein, ur 100 (A) NEGATIVE mg/dL   Nitrite NEGATIVE NEGATIVE   Leukocytes, UA TRACE (A) NEGATIVE   RBC / HPF 0-5 0 - 5 RBC/hpf   WBC, UA 11-20 0 - 5 WBC/hpf   Bacteria, UA RARE (A) NONE SEEN   Squamous Epithelial / LPF 11-20 0 - 5   Mucus PRESENT    Hyaline Casts, UA  PRESENT   Basic metabolic panel     Status: Abnormal   Collection Time: 05/13/17  8:31 PM  Result Value Ref Range   Sodium 140 135 - 145 mmol/L   Potassium 3.2 (L) 3.5 - 5.1 mmol/L   Chloride 102 101 - 111 mmol/L   CO2 24 22 - 32 mmol/L   Glucose, Bld 246 (H) 65 - 99 mg/dL   BUN 28 (H) 6 - 20 mg/dL   Creatinine, Ser 1.20 (H) 0.44 - 1.00 mg/dL   Calcium 10.0 8.9 - 10.3 mg/dL   GFR calc non Af Amer 39 (L) >60 mL/min   GFR calc Af Amer 46 (L) >60 mL/min   Anion gap 14 5 - 15  CBC with Differential     Status: None   Collection Time: 05/13/17  8:31 PM  Result Value Ref Range   WBC 6.7 4.0 - 10.5 K/uL   RBC 4.61 3.87 - 5.11 MIL/uL   Hemoglobin 12.1 12.0 - 15.0 g/dL   HCT 37.5 36.0 - 46.0 %   MCV 81.3 78.0 - 100.0 fL   MCH 26.2 26.0 - 34.0 pg   MCHC 32.3 30.0 - 36.0 g/dL   RDW 14.5 11.5 - 15.5 %   Platelets 224 150 - 400 K/uL   Neutrophils Relative % 68 %   Neutro Abs 4.5 1.7 - 7.7 K/uL   Lymphocytes Relative 25 %   Lymphs Abs 1.7 0.7 - 4.0 K/uL   Monocytes Relative 7 %   Monocytes Absolute 0.4 0.1 - 1.0 K/uL   Eosinophils Relative 0 %   Eosinophils Absolute 0.0 0.0 - 0.7 K/uL   Basophils Relative 0 %   Basophils Absolute 0.0 0.0 - 0.1 K/uL  I-Stat Troponin, ED (not at [facility])     Status: None   Collection Time: 05/13/17  8:39 PM  Result Value Ref Range   Troponin i, poc 0.04 0.00 - 0.08 ng/mL   Comment 3          Hemoglobin A1c     Status: Abnormal   Collection Time: 05/14/17  2:12 AM  Result Value Ref Range   Hgb A1c MFr Bld 6.5 (H) 4.8 - 5.6 %   Mean Plasma Glucose 139.85 mg/dL  Lipid panel     Status: Abnormal   Collection Time: 05/14/17  2:12 AM  Result Value Ref Range   Cholesterol 242 (H) 0 - 200 mg/dL   Triglycerides 99 <150 mg/dL   HDL 47 >40 mg/dL   Total CHOL/HDL Ratio 5.1 RATIO   VLDL 20 0 - 40 mg/dL   LDL Cholesterol 175 (H) 0 -  99 mg/dL  CBC     Status: Abnormal   Collection Time: 05/14/17  2:12 AM  Result Value Ref Range   WBC 6.9 4.0 - 10.5 K/uL    RBC 4.51 3.87 - 5.11 MIL/uL   Hemoglobin 11.8 (L) 12.0 - 15.0 g/dL   HCT 36.8 36.0 - 46.0 %   MCV 81.6 78.0 - 100.0 fL   MCH 26.2 26.0 - 34.0 pg   MCHC 32.1 30.0 - 36.0 g/dL   RDW 14.2 11.5 - 15.5 %   Platelets 206 150 - 400 K/uL  Creatinine, serum     Status: Abnormal   Collection Time: 05/14/17  2:12 AM  Result Value Ref Range   Creatinine, Ser 1.22 (H) 0.44 - 1.00 mg/dL   GFR calc non Af Amer 39 (L) >60 mL/min   GFR calc Af Amer 45 (L) >60 mL/min  Glucose, capillary     Status: Abnormal   Collection Time: 05/14/17  8:28 AM  Result Value Ref Range   Glucose-Capillary 134 (H) 65 - 99 mg/dL     Ct Head Wo Contrast  Result Date: 05/13/2017 CLINICAL DATA:  New onset generalized weakness today. EXAM: CT HEAD WITHOUT CONTRAST TECHNIQUE: Contiguous axial images were obtained from the base of the skull through the vertex without intravenous contrast. COMPARISON:  10/09/2007. FINDINGS: Brain: Diffusely enlarged ventricles and subarachnoid spaces. Patchy white matter low density in both cerebral hemispheres. No intracranial hemorrhage, mass lesion or CT evidence of acute infarction. Vascular: No hyperdense vessel or unexpected calcification. Skull: Normal. Negative for fracture or focal lesion. Sinuses/Orbits: No acute finding. Other: None. IMPRESSION: 1. No acute abnormality. 2. Mildly progressive atrophy and chronic small vessel white matter ischemic changes. Electronically Signed   By: Claudie Revering M.D.   On: 05/13/2017 21:15   [name]  Result Date: 05/14/2017 CLINICAL DATA:  Left-sided lower extremity weakness.  Confusion. EXAM: MRI HEAD WITHOUT CONTRAST TECHNIQUE: Multiplanar, multiecho pulse sequences of the brain and surrounding structures were obtained without intravenous contrast. COMPARISON:  Head CT 05/13/2017 FINDINGS: BRAIN: Partially empty sella. There is no acute infarct or acute hemorrhage. No mass lesion, hydrocephalus, dural abnormality or extra-axial collection.  Diffuse confluent hyperintense T2-weighted signal within the periventricular, deep and juxtacortical white matter, most commonly due to chronic ischemic microangiopathy. Old lacunar infarcts of the left pons and right basal ganglia. Generalized atrophy without lobar predilection. Chronic microhemorrhage in the left cerebellum. Bilateral basal ganglia mineralization. VASCULAR: Major intracranial arterial and venous sinus flow voids are preserved. SKULL AND UPPER CERVICAL SPINE: The visualized skull base, calvarium, upper cervical spine and extracranial soft tissues are normal. SINUSES/ORBITS: No fluid levels or advanced mucosal thickening. No mastoid or middle ear effusion. Normal orbits. IMPRESSION: 1. No acute intracranial abnormality. 2. Advanced chronic ischemic microangiopathic changes and multiple old lacunar infarcts. Electronically Signed   By: Ulyses Jarred M.D.   On: 05/14/2017 03:07    Physical Exam:    Pleasant elderly African-American lady currently not in distress. . Afebrile. Head is nontraumatic. Neck is supple without bruit.    Cardiac exam no murmur or gallop. Lungs are clear to auscultation. Distal pulses are well felt. Neurological Exam :  Awake alert oriented 2. Diminished attention, registration and recall. Follows only simple midline and one-step commands. Has some perseveration of ideas. Extraocular moments are full range without nystagmus. Blinks to threat bilaterally. Fundi were not visualized. Resting facial asymmetry with left nasolabial fold been more prominent but when she smiles there does not  appear to be significant weakness. Tongue is midline. Motor system exam no upper or lower eczema to drift. Mild weakness with some giveaway in the left grip and hand Deep tendon reflexes are brisk on the left and normal on the right. Left plantar equivocal right downgoing. Sensation appears preserved bilaterally. Gait not tested. ASSESSMENT Ms. Mary Walters is a 84 y.o. female with   What sounds like some subacute difficulties with gait and balance and complaining on the left neurological exam is fairly unremarkable except for some mild giveaway weakness on the left with reflex asymmetry. MRI scan does not show any definite stroke. Cervical spinal stenosis and myelopathy is a consideration given and symmetrical reflexes and gait and balance difficulties Hospital day # 0  TREATMENT/PLAN Recommend check MRI scan of cervical spine without contrast. Continue ongoing stroke workup and check carotid ultrasound and transcranial Doppler studies. Mobilize out of bed physical occupational therapy consults. Aspirin for stroke prevention. Discussed with Dr. Cruzita Lederer No family available at the bedside for discussion. Greater than 50% time during this 35 minute visit was spent on counseling and coordination of care about her gait and balance difficulties and discussion of her evaluation and treatment plan  Antony Contras, MD [facility] Pager: [phone] 05/14/2017 2:00 PM

## 2017-05-14 NOTE — ED Notes (Signed)
Neuro at bedside.

## 2017-05-14 NOTE — Progress Notes (Signed)
Physical Therapy Treatment Patient Details Name: Mary Walters MRN: [id number] DOB: February 26, 1928 Today's Date: 05/14/2017    History of Present Illness 84 y.o. female with medical history significant of diabetes and hypertension who presents to the ED on 4/30 brought in by her daughter and grandchildren due to new left-sided leg weakness and confusion. MRI shows no acute changes, advanced chronic ischemic microangiopathic changes and multiple old lacunar infarcts    PT Comments    Orders received for PT evaluation. Patient demonstrates deficits in functional mobility as indicated below. Will benefit from continued skilled PT to address deficits and maximize function. Will see as indicated and progress as tolerated. Patient does not appear safe for d/c home alone. Noted cognitive deficits, Left sided difficulties and continued need for hands on physical assist. At this time, recommending ST SNF to address impairments and maximize recovery of function as patient is a very high fall risk at this time.     Follow Up Recommendations  SNF;Supervision/Assistance - 24 hour     Equipment Recommendations  Rolling walker with 5" wheels    Recommendations for Other Services       Precautions / Restrictions Precautions Precautions: Fall Restrictions Weight Bearing Restrictions: No    Mobility  Bed Mobility Overal bed mobility: Needs Assistance Bed Mobility: Supine to Sit     Supine to sit: Min guard     General bed mobility comments: increased time to perform, multiple cues required to direct to task. no physical assist needed  Transfers Overall transfer level: Needs assistance Equipment used: 1 person hand held assist Transfers: Sit to/from Stand Sit to Stand: Min assist         General transfer comment: min assist with noted instability upon standing  Ambulation/Gait Ambulation/Gait assistance: Min assist Ambulation Distance (Feet): 50 Feet Assistive device: Rolling walker  (2 wheeled) Gait Pattern/deviations: Step-through pattern;Decreased stride length;Decreased weight shift to left;Trunk flexed;Drifts right/left;Narrow base of support     General Gait Details: noted instability, LLE leg lagging with poor ability to maintain balance and upright, multiple LOB. Poor ability to safely manuever RW during activity   Stairs             Wheelchair Mobility    Modified Rankin (Stroke Patients Only)       Balance Overall balance assessment: Needs assistance   Sitting balance-Leahy Scale: Fair Sitting balance - Comments: able to sit EOB but noted posterior bias requring multiple cues to come to EOB Postural control: Posterior lean   Standing balance-Leahy Scale: Poor Standing balance comment: relaince on physical assist and upper extremity support               High Level Balance Comments: assist for truns min to moderate assist            Cognition Arousal/Alertness: Awake/alert Behavior During Therapy: WFL for tasks assessed/performed Overall Cognitive Status: Impaired/Different from baseline Area of Impairment: Attention;Memory;Awareness;Problem solving;Safety/judgement;Following commands                   Current Attention Level: Selective Memory: Decreased short-term memory Following Commands: Follows multi-step commands inconsistently   Awareness: Emergent Problem Solving: Requires verbal cues;Requires tactile cues General Comments: easily distracted and tangential during activitiy. perseverative throughout session      Exercises      General Comments        Pertinent Vitals/Pain Pain Assessment: No/denies pain    Home Living Family/patient expects to be discharged to:: Private residence Living Arrangements: Alone Available  Help at Discharge: Family;Available PRN/intermittently Type of Home: Apartment Home Access: Level entry;Elevator     Home Equipment: None      Prior Function Level of Independence:  Independent      Comments: reports living alone, no assistive device does her own cooking etc. questionable historian as no famiyl or caregiver present to confirm   PT Goals (current goals can now be found in the care plan section) Acute Rehab PT Goals Patient Stated Goal: regain independence PT Goal Formulation: With patient Time For Goal Achievement: 05/28/17 Potential to Achieve Goals: Good    Frequency    Min 3X/week      PT Plan      Co-evaluation PT/OT/SLP Co-Evaluation/Treatment: Yes Reason for Co-Treatment: For patient/therapist safety PT goals addressed during session: Mobility/safety with mobility OT goals addressed during session: ADL's and self-care      AM-PAC PT "6 Clicks" Daily Activity  Outcome Measure  Difficulty turning over in bed (including adjusting bedclothes, sheets and blankets)?: A Little Difficulty moving from lying on back to sitting on the side of the bed? : A Little Difficulty sitting down on and standing up from a chair with arms (e.g., wheelchair, bedside commode, etc,.)?: Unable Help needed moving to and from a bed to chair (including a wheelchair)?: A Little Help needed walking in hospital room?: A Little Help needed climbing 3-5 steps with a railing? : A Lot 6 Click Score: 15    End of Session Equipment Utilized During Treatment: Gait belt Activity Tolerance: Patient tolerated treatment well Patient left: in chair;with call bell/phone within reach;with chair alarm set Nurse Communication: Mobility status PT Visit Diagnosis: Other abnormalities of gait and mobility (R26.89)     Time: 6045-4098 PT Time Calculation (min) (ACUTE ONLY): 30 min  Charges:                       G Codes:       Charlotte Crumb, PT DPT  Board Certified Neurologic Specialist [phone]    Fabio Asa 05/14/2017, 4:26 PM

## 2017-05-14 NOTE — Progress Notes (Signed)
Patient seen and examined this morning, admitted overnight by Dr. Willette Pa, H&P reviewed and agree with the assessment and plan.  She was admitted to the hospital with left-sided weakness and concern for a CVA.  Neurology was consulted.  Left-sided weakness -MRI negative for stroke, appreciate neurology input  Acute metabolic encephalopathy -Resolved, she appears back to baseline  Controlled type 2 diabetes mellitus, complicated by nephropathy -A1c 6.5  Hypertension -Noncompliant with medications, allow permissive hypertension right now  Chronic kidney disease stage III -Creatinine stable   Kalimah Capurro M. Elvera Lennox, MD [facility] [phone]  If 7PM-7AM, please contact night-coverage [email or web address] Password [email or web address]

## 2017-05-15 ENCOUNTER — Encounter (HOSPITAL_COMMUNITY): Payer: Self-pay | Admitting: General Practice

## 2017-05-15 DIAGNOSIS — I129 Hypertensive chronic kidney disease with stage 1 through stage 4 chronic kidney disease, or unspecified chronic kidney disease: Secondary | ICD-10-CM | POA: Diagnosis not present

## 2017-05-15 DIAGNOSIS — Z9114 Patient's other noncompliance with medication regimen: Secondary | ICD-10-CM | POA: Diagnosis not present

## 2017-05-15 DIAGNOSIS — E1122 Type 2 diabetes mellitus with diabetic chronic kidney disease: Secondary | ICD-10-CM | POA: Diagnosis not present

## 2017-05-15 DIAGNOSIS — R1312 Dysphagia, oropharyngeal phase: Secondary | ICD-10-CM | POA: Diagnosis not present

## 2017-05-15 DIAGNOSIS — E876 Hypokalemia: Secondary | ICD-10-CM

## 2017-05-15 DIAGNOSIS — I6300 Cerebral infarction due to thrombosis of unspecified precerebral artery: Secondary | ICD-10-CM | POA: Diagnosis not present

## 2017-05-15 DIAGNOSIS — G459 Transient cerebral ischemic attack, unspecified: Secondary | ICD-10-CM | POA: Diagnosis not present

## 2017-05-15 DIAGNOSIS — E1159 Type 2 diabetes mellitus with other circulatory complications: Secondary | ICD-10-CM | POA: Diagnosis not present

## 2017-05-15 DIAGNOSIS — E1165 Type 2 diabetes mellitus with hyperglycemia: Secondary | ICD-10-CM | POA: Diagnosis not present

## 2017-05-15 DIAGNOSIS — E1140 Type 2 diabetes mellitus with diabetic neuropathy, unspecified: Secondary | ICD-10-CM | POA: Diagnosis not present

## 2017-05-15 DIAGNOSIS — N183 Chronic kidney disease, stage 3 (moderate): Secondary | ICD-10-CM | POA: Diagnosis not present

## 2017-05-15 DIAGNOSIS — R279 Unspecified lack of coordination: Secondary | ICD-10-CM | POA: Diagnosis not present

## 2017-05-15 DIAGNOSIS — R41841 Cognitive communication deficit: Secondary | ICD-10-CM | POA: Diagnosis not present

## 2017-05-15 DIAGNOSIS — E1121 Type 2 diabetes mellitus with diabetic nephropathy: Secondary | ICD-10-CM

## 2017-05-15 DIAGNOSIS — I10 Essential (primary) hypertension: Secondary | ICD-10-CM

## 2017-05-15 DIAGNOSIS — M6281 Muscle weakness (generalized): Secondary | ICD-10-CM | POA: Diagnosis not present

## 2017-05-15 DIAGNOSIS — G9341 Metabolic encephalopathy: Secondary | ICD-10-CM | POA: Diagnosis not present

## 2017-05-15 DIAGNOSIS — Z743 Need for continuous supervision: Secondary | ICD-10-CM | POA: Diagnosis not present

## 2017-05-15 DIAGNOSIS — R531 Weakness: Secondary | ICD-10-CM | POA: Diagnosis not present

## 2017-05-15 DIAGNOSIS — R2689 Other abnormalities of gait and mobility: Secondary | ICD-10-CM | POA: Diagnosis not present

## 2017-05-15 DIAGNOSIS — G8194 Hemiplegia, unspecified affecting left nondominant side: Secondary | ICD-10-CM | POA: Diagnosis not present

## 2017-05-15 LAB — GLUCOSE, CAPILLARY
GLUCOSE-CAPILLARY: 145 mg/dL — AB (ref 65–99)
GLUCOSE-CAPILLARY: 115 mg/dL — AB (ref 65–99)
Glucose-Capillary: 144 mg/dL — ABNORMAL HIGH (ref 65–99)
Glucose-Capillary: 74 mg/dL (ref 65–99)
Glucose-Capillary: 104 mg/dL — ABNORMAL HIGH (ref 65–99)

## 2017-05-15 MED ORDER — AMLODIPINE BESYLATE 2.5 MG PO TABS
2.5000 mg | ORAL_TABLET | Freq: Every day | ORAL | Status: DC
  Administered 2017-05-15: 2.5 mg via ORAL
  Filled 2017-05-15: qty 1

## 2017-05-15 MED ORDER — AMLODIPINE BESYLATE 5 MG PO TABS: 5.0000 mg | ORAL_TABLET | Freq: Every day | ORAL | Status: DC

## 2017-05-15 NOTE — Progress Notes (Signed)
Physical Therapy Treatment Patient Details Name: Mary Walters MRN: [id number] DOB: [birth year] Today's Date: 05/15/2017    History of Present Illness 84 y.o. female with medical history significant of diabetes and hypertension who presents to the ED on 4/30 brought in by her daughter and grandchildren due to new left-sided leg weakness and confusion. MRI shows no acute changes, advanced chronic ischemic microangiopathic changes and multiple old lacunar infarcts    PT Comments    Patient pleasant and agreeable to participate in therapy. Pt tolerated session well and is making progress toward mobility goals. Pt continues to demonstrate generalized weakness (L side slightly weaker than R) and cognitive impairments increasing risk for falls. Pt with gait velocity <1.31 ft/sec, indicative of household ambulator. Continue to progress as tolerated with anticipated d/c to SNF for further skilled PT services.    Follow Up Recommendations  SNF;Supervision/Assistance - 24 hour     Equipment Recommendations  Rolling walker with 5" wheels    Recommendations for Other Services       Precautions / Restrictions Precautions Precautions: Fall Restrictions Weight Bearing Restrictions: No    Mobility  Bed Mobility Overal bed mobility: Needs Assistance Bed Mobility: Supine to Sit     Supine to sit: Min guard     General bed mobility comments: increased time and effort; min guard for safety  Transfers Overall transfer level: Needs assistance Equipment used: Rolling walker (2 wheeled) Transfers: Sit to/from Stand Sit to Stand: Min assist         General transfer comment: assist to power up into standing; pt reported not using AD at home and when asked to stand without AD pt said "okay" however reached out for use of RW   Ambulation/Gait Ambulation/Gait assistance: Min assist Ambulation Distance (Feet): 80 Feet Assistive device: Rolling walker (2 wheeled) Gait Pattern/deviations:  Step-through pattern;Decreased stride length;Trunk flexed;Drifts right/left;Narrow base of support   Gait velocity interpretation: <1.31 ft/sec, indicative of household ambulator General Gait Details: cues for safe use of AD, posture, and increased bilat step lengths; assist needed for balance and management of RW especially with turning and directional changes   Stairs             Wheelchair Mobility    Modified Rankin (Stroke Patients Only)       Balance Overall balance assessment: Needs assistance Sitting-balance support: Feet supported Sitting balance-Leahy Scale: Fair     Standing balance support: Bilateral upper extremity supported;During functional activity Standing balance-Leahy Scale: Poor                              Cognition Arousal/Alertness: Awake/alert Behavior During Therapy: WFL for tasks assessed/performed Overall Cognitive Status: No family/caregiver present to determine baseline cognitive functioning Area of Impairment: Memory;Awareness;Problem solving;Safety/judgement;Following commands;Attention                   Current Attention Level: Selective Memory: Decreased short-term memory Following Commands: Follows multi-step commands inconsistently Safety/Judgement: Decreased awareness of deficits Awareness: Emergent Problem Solving: Requires verbal cues;Difficulty sequencing General Comments: pt repeating stories during session and perseverating on tingling felt in R hand last night      Exercises      General Comments        Pertinent Vitals/Pain Pain Assessment: No/denies pain    Home Living                      Prior Function  PT Goals (current goals can now be found in the care plan section) Acute Rehab PT Goals Patient Stated Goal: regain independence PT Goal Formulation: With patient Time For Goal Achievement: 05/28/17 Potential to Achieve Goals: Good Progress towards PT goals:  Progressing toward goals    Frequency    Min 3X/week      PT Plan Current plan remains appropriate    Co-evaluation              AM-PAC PT "6 Clicks" Daily Activity  Outcome Measure  Difficulty turning over in bed (including adjusting bedclothes, sheets and blankets)?: A Little Difficulty moving from lying on back to sitting on the side of the bed? : Unable Difficulty sitting down on and standing up from a chair with arms (e.g., wheelchair, bedside commode, etc,.)?: Unable Help needed moving to and from a bed to chair (including a wheelchair)?: A Little Help needed walking in hospital room?: A Little Help needed climbing 3-5 steps with a railing? : A Lot 6 Click Score: 13    End of Session Equipment Utilized During Treatment: Gait belt Activity Tolerance: Patient tolerated treatment well Patient left: in chair;with call bell/phone within reach;with chair alarm set Nurse Communication: Mobility status PT Visit Diagnosis: Other abnormalities of gait and mobility (R26.89)     Time: 1550-1610 PT Time Calculation (min) (ACUTE ONLY): 20 min  Charges:  $Gait Training: 8-22 mins                    G Codes:       Mary Walters, PTA Pager: [phone]     Mary Walters 05/15/2017, 4:23 PM

## 2017-05-15 NOTE — [place] FL2 (Signed)
[place] MEDICAID FL2 LEVEL OF CARE SCREENING TOOL     IDENTIFICATION  Patient Name: Mary Walters Birthdate: 07/01/1928 Sex: female Admission Date (Current Location): 05/13/2017  [facility] and [place] Number:  Producer, television/film/video and Address:  [facility] [place], [address] [place], [place] [address]      Provider Number: [id number]  Attending Physician Name and Address:  Leatha Gilding, MD  Relative Name and Phone Number:       Current Level of Care: Hospital Recommended Level of Care: Skilled Nursing Facility Prior Approval Number:    Date Approved/Denied:   PASRR Number: [id number]  Discharge Plan: SNF    Current Diagnoses: Patient Active Problem List   Diagnosis Date Noted  . 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 Hypertension complicating diabetes (HCC) 05/14/2017  . Hypokalemia 05/14/2017  . Acute metabolic encephalopathy 05/14/2017  . Left-sided weakness     Orientation RESPIRATION BLADDER Height & Weight     Self, Time, Situation, Place  Normal Continent Weight: 129 lb 10.1 oz (58.8 kg) Height:  5' (152.4 cm)  BEHAVIORAL SYMPTOMS/MOOD NEUROLOGICAL BOWEL NUTRITION STATUS      Continent Diet(carb modified)  AMBULATORY STATUS COMMUNICATION OF NEEDS Skin   Limited Assist Verbally Normal                       Personal Care Assistance Level of Assistance  Bathing, Feeding, Dressing Bathing Assistance: Limited assistance Feeding assistance: Independent Dressing Assistance: Limited assistance     Functional Limitations Info  Sight, Hearing, Speech Sight Info: Adequate Hearing Info: Adequate Speech Info: Adequate    SPECIAL CARE FACTORS FREQUENCY  PT (By licensed PT), OT (By licensed OT)     PT Frequency: 5x/wk OT Frequency: 5x/wk            Contractures Contractures Info: Not present    Additional Factors Info  Code Status,  Allergies, Insulin Sliding Scale Code Status Info: Full Allergies Info: NKA   Insulin Sliding Scale Info: 0-15 units 3x/day with meals; 3 units 3x/day with meals       Current Medications (05/15/2017):  This is the current hospital active medication list Current Facility-Administered Medications  Medication Dose Route Frequency Provider Last Rate Last Dose  . acetaminophen (TYLENOL) tablet 650 mg  650 mg Oral Q4H PRN Lahoma Crocker, MD       Or  . acetaminophen (TYLENOL) solution 650 mg  650 mg Per Tube Q4H PRN Lahoma Crocker, MD       Or  . acetaminophen (TYLENOL) suppository 650 mg  650 mg Rectal Q4H PRN Lahoma Crocker, MD      . amLODipine (NORVASC) tablet 2.5 mg  2.5 mg Oral Daily Leatha Gilding, MD      . aspirin tablet 325 mg  325 mg Oral Daily Lahoma Crocker, MD   325 mg at 05/15/17 0926  . atorvastatin (LIPITOR) tablet 40 mg  40 mg Oral q1800 Micki Riley, MD   40 mg at 05/14/17 1833  . enoxaparin (LOVENOX) injection 40 mg  40 mg Subcutaneous Q24H Lahoma Crocker, MD   40 mg at 05/14/17 1403  . insulin aspart (novoLOG) injection 0-15 Units  0-15 Units Subcutaneous TID WC Lahoma Crocker, MD   2 Units at 05/14/17 1833  . insulin aspart (novoLOG) injection 3 Units  3 Units Subcutaneous TID WC Brett Canales  C, MD   3 Units at 05/15/17 0859  . senna-docusate (Senokot-S) tablet 1 tablet  1 tablet Oral QHS PRN Lahoma Crocker, MD         Discharge Medications: Please see discharge summary for a list of discharge medications.  Relevant Imaging Results:  Relevant Lab Results:   Additional Information SS#: [id number]  Baldemar Lenis, LCSW

## 2017-05-15 NOTE — Progress Notes (Signed)
Stroke Team Progress Note    SUBJECTIVE  patient is alone in the room.she states she is getting better. MRI scan of cervical spine shows disc bulges at C5-6 and C6-7 but without significant cord compression. Carotid ultrasound shows no significant extracranial stenosis and transcranial Doppler study suboptimal due to poor temporal windows. Echocardiogram shows normal ejection fraction. OBJECTIVE Most recent Vital Signs: Temp: 98 F (36.7 C) (05/02 1532) Temp Source: Oral (05/02 1532) BP: 126/56 (05/02 1532) Pulse Rate: 75 (05/02 1532) Respiratory Rate: 16 O2 Saturdation: 98%  CBG (last 3)  Recent Labs    05/14/17 2116 05/15/17 0645 05/15/17 1136  GLUCAP 117* 115* 144*    Diet:  Diet Order           Diet Carb Modified Fluid consistency: Thin; Room service appropriate? Yes  Diet effective now              Activity: Up with assistance   VTE Prophylaxis:  scds  Studies: Results for orders placed or performed during the hospital encounter of 05/13/17 (from the past 24 hour(s))  Glucose, capillary     Status: Abnormal   Collection Time: 05/14/17  4:52 PM  Result Value Ref Range   Glucose-Capillary 130 (H) 65 - 99 mg/dL  Glucose, capillary     Status: Abnormal   Collection Time: 05/14/17  9:16 PM  Result Value Ref Range   Glucose-Capillary 117 (H) 65 - 99 mg/dL   Comment 1 Notify RN    Comment 2 Document in Chart   Glucose, capillary     Status: Abnormal   Collection Time: 05/15/17  6:45 AM  Result Value Ref Range   Glucose-Capillary 115 (H) 65 - 99 mg/dL  Glucose, capillary     Status: Abnormal   Collection Time: 05/15/17 11:36 AM  Result Value Ref Range   Glucose-Capillary 144 (H) 65 - 99 mg/dL   Comment 1 Notify RN    Comment 2 Document in Chart      Ct Head Wo Contrast  Result Date: 05/13/2017 CLINICAL DATA:  New onset generalized weakness today. EXAM: CT HEAD WITHOUT CONTRAST TECHNIQUE: Contiguous axial images were obtained from the base of the skull  through the vertex without intravenous contrast. COMPARISON:  10/09/2007. FINDINGS: Brain: Diffusely enlarged ventricles and subarachnoid spaces. Patchy white matter low density in both cerebral hemispheres. No intracranial hemorrhage, mass lesion or CT evidence of acute infarction. Vascular: No hyperdense vessel or unexpected calcification. Skull: Normal. Negative for fracture or focal lesion. Sinuses/Orbits: No acute finding. Other: None. IMPRESSION: 1. No acute abnormality. 2. Mildly progressive atrophy and chronic small vessel white matter ischemic changes. Electronically Signed   By: Beckie Salts M.D.   On: 05/13/2017 21:15   [name]  Result Date: 05/14/2017 CLINICAL DATA:  Left-sided lower extremity weakness.  Confusion. EXAM: MRI HEAD WITHOUT CONTRAST TECHNIQUE: Multiplanar, multiecho pulse sequences of the brain and surrounding structures were obtained without intravenous contrast. COMPARISON:  Head CT 05/13/2017 FINDINGS: BRAIN: Partially empty sella. There is no acute infarct or acute hemorrhage. No mass lesion, hydrocephalus, dural abnormality or extra-axial collection. Diffuse confluent hyperintense T2-weighted signal within the periventricular, deep and juxtacortical white matter, most commonly due to chronic ischemic microangiopathy. Old lacunar infarcts of the left pons and right basal ganglia. Generalized atrophy without lobar predilection. Chronic microhemorrhage in the left cerebellum. Bilateral basal ganglia mineralization. VASCULAR: Major intracranial arterial and venous sinus flow voids are preserved. SKULL AND UPPER CERVICAL SPINE: The visualized skull base, calvarium, upper  cervical spine and extracranial soft tissues are normal. SINUSES/ORBITS: No fluid levels or advanced mucosal thickening. No mastoid or middle ear effusion. Normal orbits. IMPRESSION: 1. No acute intracranial abnormality. 2. Advanced chronic ischemic microangiopathic changes and multiple old lacunar infarcts.  Electronically Signed   By: Deatra Robinson M.D.   On: 05/14/2017 03:07   Mr Cervical Spine Wo Contrast  Result Date: 05/14/2017 CLINICAL DATA:  84 y/o  F; left-sided weakness and confusion. EXAM: MRI CERVICAL SPINE WITHOUT CONTRAST TECHNIQUE: Multiplanar, multisequence MR imaging of the cervical spine was performed. No intravenous contrast was administered. COMPARISON:  05/14/2017 MRI of the head. FINDINGS: Alignment: Physiologic. Vertebrae: No fracture, evidence of discitis, or bone lesion. Cord: Normal signal and morphology. Posterior Fossa, vertebral arteries, paraspinal tissues: Chronic lacunar infarction within the pons. Disc levels: C2-3: No significant disc displacement, foraminal stenosis, or canal stenosis. Left-greater-than-right facet hypertrophy. C3-4: Mild disc osteophyte complex with facet and uncovertebral hypertrophy. Mild foraminal stenosis. C4-5: Mild disc bulge with uncovertebral and facet hypertrophy. Mild foraminal stenosis. C5-6: Moderate disc osteophyte complex with uncovertebral and facet hypertrophy and prominent ligamentum flavum hypertrophy. Moderate bilateral foraminal and mild canal stenosis. C6-7: Moderate disc osteophyte complex with left-greater-than-right uncovertebral and facet hypertrophy. Mild left foraminal stenosis and mild canal stenosis. C7-T1: Small disc bulge and facet hypertrophy. No significant foraminal or canal stenosis. T1-2: Moderate disc bulge and facet hypertrophy greater on the right with moderate bilateral foraminal stenosis. No canal stenosis. IMPRESSION: 1. No acute osseous or cord signal abnormality. 2. Cervical spondylosis with multilevel disc and facet degenerative changes greatest at the C5-6 and C6-7 levels. 3. Mild C5-6 and C6-7 canal stenosis.  No high-grade canal stenosis. 4. Moderate C5-6 and T1-2 foraminal stenosis. Multilevel mild foraminal stenosis. Electronically Signed   By: Mitzi Hansen M.D.   On: 05/14/2017 21:12   LDL cholesterol  175 mg percent. Hemoglobin A1c 6.5.  Physical Exam:    Pleasant elderly African-American lady currently not in distress. . Afebrile. Head is nontraumatic. Neck is supple without bruit.    Cardiac exam no murmur or gallop. Lungs are clear to auscultation. Distal pulses are well felt. Neurological Exam :  Awake alert oriented 2. Diminished attention, registration and recall. Follows only simple midline and one-step commands. Has some perseveration of ideas. Extraocular moments are full range without nystagmus. Blinks to threat bilaterally. Fundi were not visualized. Resting facial asymmetry with left nasolabial fold been more prominent but when she smiles there does not appear to be significant weakness. Tongue is midline. Motor system exam no upper or lower eczema to drift. Mild weakness with some giveaway in the left grip and hand Deep tendon reflexes are brisk on the left and normal on the right. Left plantar equivocal right downgoing. Sensation appears preserved bilaterally. Gait not tested. ASSESSMENT Ms. LAVREN LEWAN is a 84 y.o. female with  What sounds like some subacute difficulties with gait and balance and complaining on the left neurological exam is fairly unremarkable except for some mild giveaway weakness on the left with reflex asymmetry. MRI scan does not show any definite stroke. Cervical spinal  MRI shows mild disc degenerative changes without significant cord compression or spinal stenosis. Hospital day # 0  TREATMENT/PLAN   Continue aspirin and aggressive risk factor modification.Rober Minion out of bed physical occupational therapy consults. Transfer to skilled nursing facility when bed available in the next few days.Aspirin for stroke prevention. Discussed with Dr. Elvera Lennox No family available at the bedside for discussion.  Stroke team will  sign off. Kindly call for questions. Delia Heady, MD [facility] Pager: [phone] 05/15/2017 4:34 PM

## 2017-05-15 NOTE — Progress Notes (Signed)
PROGRESS NOTE  Mary Walters:[id number] DOB: 1928/04/22 DOA: 05/13/2017 PCP: No primary care provider on file.   LOS: 0 days   Brief Narrative / Interim history: 84 year old female with diabetes, hypertension, noncompliant with medication and off of her home meds for 5 years all of her own choice was admitted to the hospital with left-sided weakness, confusion, for CVA work-up.  Neurology was consulted.  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)   Hypertension complicating diabetes (HCC)   Hypokalemia   Acute metabolic encephalopathy   Left-sided weakness   Left-sided weakness/acute encephalopathy -Weakness improved, mental status is clearing up, MRI of the brain without acute CVA -Neurology following, underwent a C-spine MRI which did not show any significant findings but did show diffuse degenerative changes -Poor functional status, physical therapy evaluated patient and recommended SNF, patient and family in agreement, discussed with social worker and awaiting insurance authorization and bed availability, not safe for home discharge given underlying mild memory problems  Type 2 diabetes mellitus with nephropathy -Hemoglobin A1c 6.5, continue diet control  Chronic kidney disease stage III -Creatinine is stable  Hypertension -Suboptimally controlled, blood pressure elevated this morning 167/80, add amlodipine and closely monitor   DVT prophylaxis: Lovenox Code Status: Full code Family Communication: no family at bedside Disposition Plan: SNF pending insurance auth  Consultants:   Neurology   Procedures:   2D echo:  Study Conclusions - Left ventricle: There was mild focal basal hypertrophy of the septum. Systolic function was normal. The estimated ejection fraction was in the range of 60% to 65%. Doppler parameters are consistent with both elevated ventricular  end-diastolic filling pressure and elevated left atrial filling pressure. - Mitral valve: There was mild regurgitation.  Antimicrobials:  None    Subjective: -Doing well this morning, breakfast.  No chest pain, no shortness of breath, no abdominal pain, nausea or vomiting.  Mild confusion present  Objective: Vitals:   05/14/17 1651 05/15/17 0012 05/15/17 0315 05/15/17 0841  BP: (!) 153/72 (!) 142/68 (!) 144/79 (!) 167/80  Pulse: 63 69 67 83  Resp: Temp: 98.7 F (37.1 C) (!) 97.5 F (36.4 C) 98.2 F (36.8 C) 98.1 F (36.7 C)  TempSrc: Oral Oral Oral Oral  SpO2: 100% 100% 100% 97%  Weight:      Height:        Intake/Output Summary (Last 24 hours) at 05/15/2017 1108 Last data filed at 05/15/2017 0843 Gross per 24 hour  Intake 1046.25 ml  Output -  Net 1046.25 ml   Filed Weights   05/14/17 0327  Weight: 58.8 kg (129 lb 10.1 oz)    Examination:  Constitutional: NAD Eyes: lids and conjunctivae normal ENMT: Mucous membranes are moist.  Respiratory: clear to auscultation bilaterally, no wheezing, no crackles. Normal respiratory effort. Cardiovascular: Regular rate and rhythm, no murmurs / rubs / gallops. No LE edema. Abdomen: no tenderness. Bowel sounds positive.  Skin: no rashes Neurologic: CN 2-12 grossly intact. Strength 5/5 in all 4.   Data Reviewed: I have independently reviewed following labs and imaging studies   CBC: Recent Labs  Lab 05/13/17 2031 05/14/17 0212  WBC 6.7 6.9  NEUTROABS 4.5  --   HGB 12.1 11.8*  HCT 37.5 36.8  MCV 81.3 81.6  PLT 224 206   Basic Metabolic Panel: Recent Labs  Lab 05/13/17 2031 05/14/17 0212  NA 140  --  K 3.2*  --   CL 102  --   CO2 24  --   GLUCOSE 246*  --   BUN 28*  --   CREATININE 1.20* 1.22*  CALCIUM 10.0  --    GFR: Estimated Creatinine Clearance: 26.1 mL/min (A) (by C-G formula based on SCr of 1.22 mg/dL (H)). Liver Function Tests: No results for input(s): AST, ALT, ALKPHOS, BILITOT, PROT,  ALBUMIN in the last 168 hours. No results for input(s): LIPASE, AMYLASE in the last 168 hours. No results for input(s): AMMONIA in the last 168 hours. Coagulation Profile: No results for input(s): INR, PROTIME in the last 168 hours. Cardiac Enzymes: No results for input(s): CKTOTAL, CKMB, CKMBINDEX, TROPONINI in the last 168 hours. BNP (last 3 results) No results for input(s): PROBNP in the last 8760 hours. HbA1C: Recent Labs    05/14/17 0212  HGBA1C 6.5*   CBG: Recent Labs  Lab 05/14/17 0828 05/14/17 1125 05/14/17 1652 05/14/17 2116 05/15/17 0645  GLUCAP 134* 145* 130* 117* 115*   Lipid Profile: Recent Labs    05/14/17 0212  CHOL 242*  HDL 47  LDLCALC 175*  TRIG 99  CHOLHDL 5.1   Thyroid Function Tests: No results for input(s): TSH, T4TOTAL, FREET4, T3FREE, THYROIDAB in the last 72 hours. Anemia Panel: No results for input(s): VITAMINB12, FOLATE, FERRITIN, TIBC, IRON, RETICCTPCT in the last 72 hours. Urine analysis:    Component Value Date/Time   COLORURINE AMBER (A) 05/13/2017 2015   APPEARANCEUR CLOUDY (A) 05/13/2017 2015   LABSPEC 1.025 05/13/2017 2015   PHURINE 5.0 05/13/2017 2015   GLUCOSEU NEGATIVE 05/13/2017 2015   HGBUR NEGATIVE 05/13/2017 2015   BILIRUBINUR SMALL (A) 05/13/2017 2015   KETONESUR 20 (A) 05/13/2017 2015   PROTEINUR 100 (A) 05/13/2017 2015   NITRITE NEGATIVE 05/13/2017 2015   LEUKOCYTESUR TRACE (A) 05/13/2017 2015   Sepsis Labs: Invalid input(s): PROCALCITONIN, LACTICIDVEN  No results found for this or any previous visit (from the past 240 hour(s)).    Radiology Studies: Ct Head Wo Contrast  Result Date: 05/13/2017 CLINICAL DATA:  New onset generalized weakness today. EXAM: CT HEAD WITHOUT CONTRAST TECHNIQUE: Contiguous axial images were obtained from the base of the skull through the vertex without intravenous contrast. COMPARISON:  10/09/2007. FINDINGS: Brain: Diffusely enlarged ventricles and subarachnoid spaces. Patchy white  matter low density in both cerebral hemispheres. No intracranial hemorrhage, mass lesion or CT evidence of acute infarction. Vascular: No hyperdense vessel or unexpected calcification. Skull: Normal. Negative for fracture or focal lesion. Sinuses/Orbits: No acute finding. Other: None. IMPRESSION: 1. No acute abnormality. 2. Mildly progressive atrophy and chronic small vessel white matter ischemic changes. Electronically Signed   By: Beckie Salts M.D.   On: 05/13/2017 21:15   [name]  Result Date: 05/14/2017 CLINICAL DATA:  Left-sided lower extremity weakness.  Confusion. EXAM: MRI HEAD WITHOUT CONTRAST TECHNIQUE: Multiplanar, multiecho pulse sequences of the brain and surrounding structures were obtained without intravenous contrast. COMPARISON:  Head CT 05/13/2017 FINDINGS: BRAIN: Partially empty sella. There is no acute infarct or acute hemorrhage. No mass lesion, hydrocephalus, dural abnormality or extra-axial collection. Diffuse confluent hyperintense T2-weighted signal within the periventricular, deep and juxtacortical white matter, most commonly due to chronic ischemic microangiopathy. Old lacunar infarcts of the left pons and right basal ganglia. Generalized atrophy without lobar predilection. Chronic microhemorrhage in the left cerebellum. Bilateral basal ganglia mineralization. VASCULAR: Major intracranial arterial and venous sinus flow voids are preserved. SKULL AND UPPER CERVICAL SPINE: The visualized skull  base, calvarium, upper cervical spine and extracranial soft tissues are normal. SINUSES/ORBITS: No fluid levels or advanced mucosal thickening. No mastoid or middle ear effusion. Normal orbits. IMPRESSION: 1. No acute intracranial abnormality. 2. Advanced chronic ischemic microangiopathic changes and multiple old lacunar infarcts. Electronically Signed   By: Deatra Robinson M.D.   On: 05/14/2017 03:07   Mr Cervical Spine Wo Contrast  Result Date: 05/14/2017 CLINICAL DATA:  84 y/o  F;  left-sided weakness and confusion. EXAM: MRI CERVICAL SPINE WITHOUT CONTRAST TECHNIQUE: Multiplanar, multisequence MR imaging of the cervical spine was performed. No intravenous contrast was administered. COMPARISON:  05/14/2017 MRI of the head. FINDINGS: Alignment: Physiologic. Vertebrae: No fracture, evidence of discitis, or bone lesion. Cord: Normal signal and morphology. Posterior Fossa, vertebral arteries, paraspinal tissues: Chronic lacunar infarction within the pons. Disc levels: C2-3: No significant disc displacement, foraminal stenosis, or canal stenosis. Left-greater-than-right facet hypertrophy. C3-4: Mild disc osteophyte complex with facet and uncovertebral hypertrophy. Mild foraminal stenosis. C4-5: Mild disc bulge with uncovertebral and facet hypertrophy. Mild foraminal stenosis. C5-6: Moderate disc osteophyte complex with uncovertebral and facet hypertrophy and prominent ligamentum flavum hypertrophy. Moderate bilateral foraminal and mild canal stenosis. C6-7: Moderate disc osteophyte complex with left-greater-than-right uncovertebral and facet hypertrophy. Mild left foraminal stenosis and mild canal stenosis. C7-T1: Small disc bulge and facet hypertrophy. No significant foraminal or canal stenosis. T1-2: Moderate disc bulge and facet hypertrophy greater on the right with moderate bilateral foraminal stenosis. No canal stenosis. IMPRESSION: 1. No acute osseous or cord signal abnormality. 2. Cervical spondylosis with multilevel disc and facet degenerative changes greatest at the C5-6 and C6-7 levels. 3. Mild C5-6 and C6-7 canal stenosis.  No high-grade canal stenosis. 4. Moderate C5-6 and T1-2 foraminal stenosis. Multilevel mild foraminal stenosis. Electronically Signed   By: Mitzi Hansen M.D.   On: 05/14/2017 21:12     Scheduled Meds: . aspirin  325 mg Oral Daily  . atorvastatin  40 mg Oral q1800  . enoxaparin (LOVENOX) injection  40 mg Subcutaneous Q24H  . insulin aspart  0-15  Units Subcutaneous TID WC  . insulin aspart  3 Units Subcutaneous TID WC   Continuous Infusions:   Pamella Pert, MD, PhD [facility] Pager [phone]  If 7PM-7AM, please contact night-coverage [email or web address] Password [email or web address] 05/15/2017, 11:08 AM

## 2017-05-15 NOTE — Clinical Social Work Note (Signed)
Clinical Social Work Assessment  Patient Details  Name: Mary Walters MRN: [id number] Date of Birth: Dec 05, 1928  Date of referral:  05/15/17               Reason for consult:  Facility Placement                Permission sought to share information with:  Facility Sport and exercise psychologist, Family Supports Permission granted to share information::  Yes, Verbal Permission Granted  Name::     Emergency planning/management officer::  SNF  Relationship::  Daughter-in-law  Contact Information:     Housing/Transportation Living arrangements for the past 2 months:  [place] of Information:  Patient, Adult Children Patient Interpreter Needed:  None Criminal Activity/Legal Involvement Pertinent to Current Situation/Hospitalization:  No - Comment as needed Significant Relationships:  Adult Children Lives with:  Self Do you feel safe going back to the place where you live?  Yes Need for family participation in patient care:  No (Coment)  Care giving concerns:  Patient lives home alone and will benefit from short term rehab at discharge.    Social Worker assessment / plan:  CSW met with patient and daughter-in-law at bedside to discuss SNF placement. CSW discussed insurance and obtained copies of insurance cards (faxed to financial counseling to add to patient's chart, as none on file). CSW discussed facility options with [facility] and determined preferences. CSW to send referral and determine bed availability to start insurance authorization request.  Employment status:  Retired Nurse, adult PT Recommendations:  [place], [place] / Referral to community resources:  [place]  Patient/Family's Response to care:  Patient agreeable to SNF placement.  Patient/Family's Understanding of and Emotional Response to Diagnosis, Current Treatment, and Prognosis:  Patient and daughter-in-law are realistic about patient's  current abilities and needing additional care at this time to prevent falls. Patient hopeful to receive therapy and return home to care for herself afterwards.  Emotional Assessment Appearance:  Appears stated age Attitude/Demeanor/Rapport:  Engaged Affect (typically observed):  Pleasant Orientation:  Oriented to Self, Oriented to Place, Oriented to  Time, Oriented to Situation Alcohol / Substance use:  Not Applicable Psych involvement (Current and /or in the community):  No (Comment)  Discharge Needs  Concerns to be addressed:  Care Coordination Readmission within the last 30 days:  No Current discharge risk:  Dependent with Mobility, Lives alone Barriers to Discharge:  Continued Medical Work up, [place], [place] 05/15/2017, 5:33 PM

## 2017-05-16 LAB — GLUCOSE, CAPILLARY
GLUCOSE-CAPILLARY: 137 mg/dL — AB (ref 65–99)
Glucose-Capillary: 99 mg/dL (ref 65–99)
Glucose-Capillary: 90 mg/dL (ref 65–99)
Glucose-Capillary: 162 mg/dL — ABNORMAL HIGH (ref 65–99)

## 2017-05-16 MED ORDER — AMLODIPINE BESYLATE 5 MG PO TABS
5.0000 mg | ORAL_TABLET | Freq: Every day | ORAL | Status: DC
  Administered 2017-05-16 – 2017-05-17 (×2): 5 mg via ORAL
  Filled 2017-05-16 (×2): qty 1

## 2017-05-16 MED ORDER — ONDANSETRON 4 MG PO TBDP
4.0000 mg | ORAL_TABLET | Freq: Three times a day (TID) | ORAL | Status: DC | PRN
  Administered 2017-05-16: 4 mg via ORAL
  Filled 2017-05-16: qty 1

## 2017-05-16 NOTE — Progress Notes (Signed)
PROGRESS NOTE  Mary Walters ZOX:[id number] DOB: 1928-08-15 DOA: 05/13/2017 PCP: No primary care provider on file.   LOS: 1 day   Brief Narrative / Interim history: 84 year old female with diabetes, hypertension, noncompliant with medication and off of her home meds for 5 years all of her own choice was admitted to the hospital with left-sided weakness, confusion, for CVA work-up.  Neurology was consulted.  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)   Hypertension complicating diabetes (HCC)   Hypokalemia   Acute metabolic encephalopathy   Left-sided weakness   Left-sided weakness/acute encephalopathy -Weakness improved, mental status is clearing up, MRI of the brain without acute CVA -Neurology following, underwent a C-spine MRI which did not show any significant findings but did show diffuse degenerative changes -Poor functional status, physical therapy evaluated patient and recommended SNF, patient and family in agreement, awaiting insurance authorization  Type 2 diabetes mellitus with nephropathy -Hemoglobin A1c 6.5, continue diet control  Chronic kidney disease stage III -Creatinine is stable  Hypertension -Continue to increase Norvasc dose today   DVT prophylaxis: Lovenox Code Status: Full code Family Communication: Daughter at bedside Disposition Plan: SNF pending insurance auth  Consultants:   Neurology   Procedures:   2D echo:  Study Conclusions - Left ventricle: There was mild focal basal hypertrophy of the septum. Systolic function was normal. The estimated ejection fraction was in the range of 60% to 65%. Doppler parameters are consistent with both elevated ventricular end-diastolic filling pressure and elevated left atrial filling pressure. - Mitral valve: There was mild regurgitation.  Antimicrobials:  None    Subjective: -No complaints,  Objective: Vitals:   05/16/17 0020 05/16/17 0440  05/16/17 0935 05/16/17 1232  BP: (!) 156/79 (!) 187/97 (!) 163/102 (!) 174/89  Pulse: 72 80 84 90  Resp: Temp: 98.5 F (36.9 C) 98 F (36.7 C) 98 F (36.7 C) 98.2 F (36.8 C)  TempSrc: Oral Oral Oral Axillary  SpO2: 100% 100% 100% 99%  Weight:      Height:       No intake or output data in the 24 hours ending 05/16/17 1513 Filed Weights   05/14/17 0327  Weight: 58.8 kg (129 lb 10.1 oz)    Examination:  Constitutional: NAD Respiratory: CTA Cardiovascular: RRR  Data Reviewed: I have independently reviewed following labs and imaging studies   CBC: Recent Labs  Lab 05/13/17 2031 05/14/17 0212  WBC 6.7 6.9  NEUTROABS 4.5  --   HGB 12.1 11.8*  HCT 37.5 36.8  MCV 81.3 81.6  PLT 224 206   Basic Metabolic Panel: Recent Labs  Lab 05/13/17 2031 05/14/17 0212  NA 140  --   K 3.2*  --   CL 102  --   CO2 24  --   GLUCOSE 246*  --   BUN 28*  --   CREATININE 1.20* 1.22*  CALCIUM 10.0  --    GFR: Estimated Creatinine Clearance: 26.1 mL/min (A) (by C-G formula based on SCr of 1.22 mg/dL (H)). Liver Function Tests: No results for input(s): AST, ALT, ALKPHOS, BILITOT, PROT, ALBUMIN in the last 168 hours. No results for input(s): LIPASE, AMYLASE in the last 168 hours. No results for input(s): AMMONIA in the last 168 hours. Coagulation Profile: No results for input(s): INR, PROTIME in the last 168 hours. Cardiac Enzymes: No results for input(s): CKTOTAL, CKMB, CKMBINDEX, TROPONINI in the last 168 hours. BNP (last 3 results)  No results for input(s): PROBNP in the last 8760 hours. HbA1C: Recent Labs    05/14/17 0212  HGBA1C 6.5*   CBG: Recent Labs  Lab 05/15/17 1136 05/15/17 1641 05/15/17 2207 05/16/17 0657 05/16/17 1109  GLUCAP 144* 74 104* 99 137*   Lipid Profile: Recent Labs    05/14/17 0212  CHOL 242*  HDL 47  LDLCALC 175*  TRIG 99  CHOLHDL 5.1   Thyroid Function Tests: No results for input(s): TSH, T4TOTAL, FREET4, T3FREE,  THYROIDAB in the last 72 hours. Anemia Panel: No results for input(s): VITAMINB12, FOLATE, FERRITIN, TIBC, IRON, RETICCTPCT in the last 72 hours. Urine analysis:    Component Value Date/Time   COLORURINE AMBER (A) 05/13/2017 2015   APPEARANCEUR CLOUDY (A) 05/13/2017 2015   LABSPEC 1.025 05/13/2017 2015   PHURINE 5.0 05/13/2017 2015   GLUCOSEU NEGATIVE 05/13/2017 2015   HGBUR NEGATIVE 05/13/2017 2015   BILIRUBINUR SMALL (A) 05/13/2017 2015   KETONESUR 20 (A) 05/13/2017 2015   PROTEINUR 100 (A) 05/13/2017 2015   NITRITE NEGATIVE 05/13/2017 2015   LEUKOCYTESUR TRACE (A) 05/13/2017 2015   Sepsis Labs: Invalid input(s): PROCALCITONIN, LACTICIDVEN  No results found for this or any previous visit (from the past 240 hour(s)).    Radiology Studies: Mr Cervical Spine Wo Contrast  Result Date: 05/14/2017 CLINICAL DATA:  84 y/o  F; left-sided weakness and confusion. EXAM: MRI CERVICAL SPINE WITHOUT CONTRAST TECHNIQUE: Multiplanar, multisequence MR imaging of the cervical spine was performed. No intravenous contrast was administered. COMPARISON:  05/14/2017 MRI of the head. FINDINGS: Alignment: Physiologic. Vertebrae: No fracture, evidence of discitis, or bone lesion. Cord: Normal signal and morphology. Posterior Fossa, vertebral arteries, paraspinal tissues: Chronic lacunar infarction within the pons. Disc levels: C2-3: No significant disc displacement, foraminal stenosis, or canal stenosis. Left-greater-than-right facet hypertrophy. C3-4: Mild disc osteophyte complex with facet and uncovertebral hypertrophy. Mild foraminal stenosis. C4-5: Mild disc bulge with uncovertebral and facet hypertrophy. Mild foraminal stenosis. C5-6: Moderate disc osteophyte complex with uncovertebral and facet hypertrophy and prominent ligamentum flavum hypertrophy. Moderate bilateral foraminal and mild canal stenosis. C6-7: Moderate disc osteophyte complex with left-greater-than-right uncovertebral and facet hypertrophy.  Mild left foraminal stenosis and mild canal stenosis. C7-T1: Small disc bulge and facet hypertrophy. No significant foraminal or canal stenosis. T1-2: Moderate disc bulge and facet hypertrophy greater on the right with moderate bilateral foraminal stenosis. No canal stenosis. IMPRESSION: 1. No acute osseous or cord signal abnormality. 2. Cervical spondylosis with multilevel disc and facet degenerative changes greatest at the C5-6 and C6-7 levels. 3. Mild C5-6 and C6-7 canal stenosis.  No high-grade canal stenosis. 4. Moderate C5-6 and T1-2 foraminal stenosis. Multilevel mild foraminal stenosis. Electronically Signed   By: Mitzi Hansen M.D.   On: 05/14/2017 21:12     Scheduled Meds: . amLODipine  5 mg Oral Daily  . aspirin  325 mg Oral Daily  . atorvastatin  40 mg Oral q1800  . enoxaparin (LOVENOX) injection  40 mg Subcutaneous Q24H  . insulin aspart  0-15 Units Subcutaneous TID WC  . insulin aspart  3 Units Subcutaneous TID WC   Continuous Infusions:   Pamella Pert, MD, PhD [facility] Pager [phone]  If 7PM-7AM, please contact night-coverage [email or web address] Password [email or web address] 05/16/2017, 3:13 PM

## 2017-05-16 NOTE — Progress Notes (Signed)
Patient starting to fel nauseous and dry heaving, MD notified and PRNs made available

## 2017-05-16 NOTE — Progress Notes (Signed)
Patient awake and assisted to [facility] were she voided, a moderate amount of,(clear ,yellow) w/o difficulty. Patient assisted in recliner were she ate about 100% of breakfast. Medications administered w/o difficulty, patients tele-monitor D/C

## 2017-05-16 NOTE — Progress Notes (Signed)
  Speech Language Pathology Treatment: Cognitive-Linquistic  Patient Details Name: Mary Walters MRN: [id number] DOB: 31-May-1928 Today's Date: 05/16/2017 Time: 1000-1015 SLP Time Calculation (min) (ACUTE ONLY): 15 min  Assessment / Plan / Recommendation Clinical Impression  Pt pleasant, attentive and socially appropriate, but continues to demonstrates decreased ability to problem solve, remain on task with reasoning and safety. Easily responsive to verbal cues and assistance. Would benefit from SLP at next level of care to target problem solving with familiar basic ADL's for increased independence (preparing easy meals, self care etc.) though pt will likely need assist from family into the long term with management of medication, finances and appointments as cognitive/memory deficits are expected to persist.   HPI HPI: Mary Whipkey Blountis a 84 y.o.femalewith medical history significant ofdiabetes and hypertension who has been off her medications for about 5 years because she "heard that they may not help" herpresents the emergency department brought in by her daughter and grandchildren due to new left-sided leg weakness and confusion. The patient was last seen well about a week ago when according to her daughter she was able to ambulate adequately. At noon today she was picked up by her grandchild to go to lunch and she seemed a little bit confused she was unable to get up and required assistance for that. She has had some left-sided deficits including weakness of the lower extremity and upper extremity. She denies any nausea vomiting diarrhea or constipation. She denies any blurry vision or double vision. He has significant concerns about requiring assistance to help her get out of the car. She would like some sort of supportive or assistive device. She feels that she is having great difficulty moving around. Running to her all of the symptoms have come on gradually but the family seems to  think that there has been a significant change today. Denies any history of syncope, she has no prior history of atrial fibrillation denies any history of heart attacks or prior strokes. The patient lives alone. She denies numbness or tingling. Headache or visual changes. She has no chest pain or palpitations no shortness of breath no nausea or vomiting. She reports not getting a flu shot but did not have any flulike illnesses or fever or chills recently. Has no urinary frequency or urgency. She feels that she could drink more water and does not drink as much as she should but has no history of nausea vomiting or diarrhea recently. Grandson reports difficulty with her short-term memory. Her memory appears to be intact. 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      SLP Plan  Continue with current plan of care       Recommendations                   Plan: Continue with current plan of care       GO               [facility], MA CCC-SLP [phone]  Claudine Mouton 05/16/2017, 10:45 AM

## 2017-05-17 DIAGNOSIS — E1142 Type 2 diabetes mellitus with diabetic polyneuropathy: Secondary | ICD-10-CM | POA: Diagnosis not present

## 2017-05-17 DIAGNOSIS — R441 Visual hallucinations: Secondary | ICD-10-CM | POA: Diagnosis not present

## 2017-05-17 DIAGNOSIS — E1165 Type 2 diabetes mellitus with hyperglycemia: Secondary | ICD-10-CM | POA: Diagnosis not present

## 2017-05-17 DIAGNOSIS — E1121 Type 2 diabetes mellitus with diabetic nephropathy: Secondary | ICD-10-CM | POA: Diagnosis not present

## 2017-05-17 DIAGNOSIS — M79675 Pain in left toe(s): Secondary | ICD-10-CM | POA: Diagnosis not present

## 2017-05-17 DIAGNOSIS — E785 Hyperlipidemia, unspecified: Secondary | ICD-10-CM | POA: Diagnosis not present

## 2017-05-17 DIAGNOSIS — N183 Chronic kidney disease, stage 3 (moderate): Secondary | ICD-10-CM | POA: Diagnosis not present

## 2017-05-17 DIAGNOSIS — R279 Unspecified lack of coordination: Secondary | ICD-10-CM | POA: Diagnosis not present

## 2017-05-17 DIAGNOSIS — Z743 Need for continuous supervision: Secondary | ICD-10-CM | POA: Diagnosis not present

## 2017-05-17 DIAGNOSIS — R2689 Other abnormalities of gait and mobility: Secondary | ICD-10-CM | POA: Diagnosis not present

## 2017-05-17 DIAGNOSIS — R630 Anorexia: Secondary | ICD-10-CM | POA: Diagnosis not present

## 2017-05-17 DIAGNOSIS — G8194 Hemiplegia, unspecified affecting left nondominant side: Secondary | ICD-10-CM | POA: Diagnosis not present

## 2017-05-17 DIAGNOSIS — B351 Tinea unguium: Secondary | ICD-10-CM | POA: Diagnosis not present

## 2017-05-17 DIAGNOSIS — M6281 Muscle weakness (generalized): Secondary | ICD-10-CM | POA: Diagnosis not present

## 2017-05-17 DIAGNOSIS — M79674 Pain in right toe(s): Secondary | ICD-10-CM | POA: Diagnosis not present

## 2017-05-17 DIAGNOSIS — R531 Weakness: Secondary | ICD-10-CM | POA: Diagnosis not present

## 2017-05-17 DIAGNOSIS — R410 Disorientation, unspecified: Secondary | ICD-10-CM | POA: Diagnosis not present

## 2017-05-17 DIAGNOSIS — I10 Essential (primary) hypertension: Secondary | ICD-10-CM | POA: Diagnosis not present

## 2017-05-17 DIAGNOSIS — R1310 Dysphagia, unspecified: Secondary | ICD-10-CM | POA: Diagnosis not present

## 2017-05-17 DIAGNOSIS — R41841 Cognitive communication deficit: Secondary | ICD-10-CM | POA: Diagnosis not present

## 2017-05-17 DIAGNOSIS — E1140 Type 2 diabetes mellitus with diabetic neuropathy, unspecified: Secondary | ICD-10-CM | POA: Diagnosis not present

## 2017-05-17 DIAGNOSIS — G9341 Metabolic encephalopathy: Secondary | ICD-10-CM | POA: Diagnosis not present

## 2017-05-17 DIAGNOSIS — E876 Hypokalemia: Secondary | ICD-10-CM | POA: Diagnosis not present

## 2017-05-17 DIAGNOSIS — E1159 Type 2 diabetes mellitus with other circulatory complications: Secondary | ICD-10-CM | POA: Diagnosis not present

## 2017-05-17 DIAGNOSIS — E119 Type 2 diabetes mellitus without complications: Secondary | ICD-10-CM | POA: Diagnosis not present

## 2017-05-17 DIAGNOSIS — G459 Transient cerebral ischemic attack, unspecified: Secondary | ICD-10-CM | POA: Diagnosis not present

## 2017-05-17 DIAGNOSIS — R1312 Dysphagia, oropharyngeal phase: Secondary | ICD-10-CM | POA: Diagnosis not present

## 2017-05-17 LAB — GLUCOSE, CAPILLARY
GLUCOSE-CAPILLARY: 109 mg/dL — AB (ref 65–99)
GLUCOSE-CAPILLARY: 119 mg/dL — AB (ref 65–99)

## 2017-05-17 MED ORDER — AMLODIPINE BESYLATE 5 MG PO TABS: 5.0000 mg | ORAL_TABLET | Freq: Every day | ORAL | Status: DC

## 2017-05-17 MED ORDER — ATORVASTATIN CALCIUM 40 MG PO TABS: 40.0000 mg | ORAL_TABLET | Freq: Every day | ORAL | Status: DC

## 2017-05-17 MED ORDER — ASPIRIN 325 MG PO TABS: 325.0000 mg | ORAL_TABLET | Freq: Every day | ORAL | Status: DC

## 2017-05-17 MED ORDER — ONDANSETRON 4 MG PO TBDP: 4.0000 mg | ORAL_TABLET | Freq: Three times a day (TID) | ORAL | 0 refills | Status: DC | PRN

## 2017-05-17 NOTE — Clinical Social Work Placement (Addendum)
Nurse to call report to [phone], Room 501    CLINICAL SOCIAL WORK PLACEMENT  NOTE  Date:  05/17/2017  Patient Details  Name: Mary Walters MRN: [id number] Date of Birth: 07/10/1928  Clinical Social Work is seeking post-discharge placement for this patient at the Skilled  Nursing Facility level of care (*CSW will initial, date and re-position this form in  chart as items are completed):  Yes   Patient/family provided with [facility] Clinical Social Work Department's list of facilities offering this level of care within the geographic area requested by the patient (or if unable, by the patient's family).  Yes   Patient/family informed of their freedom to choose among providers that offer the needed level of care, that participate in Medicare, Medicaid or managed care program needed by the patient, have an available bed and are willing to accept the patient.  Yes   Patient/family informed of 's ownership interest in [facility] and [facility], as well as of the fact that they are under no obligation to receive care at these facilities.  PASRR submitted to EDS on       PASRR number received on 05/15/17     Existing PASRR number confirmed on       FL2 transmitted to all facilities in geographic area requested by pt/family on 05/15/17     FL2 transmitted to all facilities within larger geographic area on       Patient informed that his/her managed care company has contracts with or will negotiate with certain facilities, including the following:        Yes   Patient/family informed of bed offers received.  Patient chooses bed at [facility]     Physician recommends and patient chooses bed at      Patient to be transferred to [facility] on 05/17/17.  Patient to be transferred to facility by [facility]     Patient family notified on 05/17/17 of transfer.  Name of family member notified:  [name]     PHYSICIAN       Additional Comment:     _______________________________________________ Baldemar Lenis, LCSW 05/17/2017, 9:58 AM

## 2017-05-17 NOTE — Progress Notes (Addendum)
Report called to [facility]; staff member excepted the report; discharge instructions given and reviewed with patient and daughter-in-law; patient discharged via ambulance transport.

## 2017-05-17 NOTE — Discharge Summary (Signed)
Physician Discharge Summary  Mary Walters YNW:[id number] DOB: 1928/03/06 DOA: 05/13/2017  PCP: No primary care provider on file.  Admit date: 05/13/2017 Discharge date: 05/17/2017  Admitted From: home Disposition:  SNF  Recommendations for Outpatient Follow-up:  1. Follow up with PCP in 1-2 weeks 2. Continue Aspirin and statin  Home Health: none Equipment/Devices: none  Discharge Condition: stable CODE STATUS: Full code Diet recommendation: heart healthy  HPI: Per Dr. Willette Pa, Mary Walters is a 84 y.o. female with medical history significant of diabetes and hypertension who has been off her medications for about 5 years because she "heard that they may not help" her presents the emergency department brought in by her daughter and grandchildren due to new left-sided leg weakness and confusion.  The patient was last seen well about a week ago when according to her daughter she was able to ambulate adequately.  At noon today she was picked up by her grandchild to go to lunch and she seemed a little bit confused she was unable to get up and required assistance for that.  She has had some left-sided deficits including weakness of the lower extremity and upper extremity.  She denies any nausea vomiting diarrhea or constipation.  She denies any blurry vision or double vision.  He has significant concerns about requiring assistance to help her get out of the car.  She would like some sort of supportive or assistive device.  She feels that she is having great difficulty moving around.  Running to her all of the symptoms have come on gradually but the family seems to think that there has been a significant change today.  Denies any history of syncope, she has no prior history of atrial fibrillation denies any history of heart attacks or prior strokes.  The patient lives alone.  She denies numbness or tingling.  Headache or visual changes.  She has no chest pain or palpitations no shortness of  breath no nausea or vomiting.  She reports not getting a flu shot but did not have any flulike illnesses or fever or chills recently.  Has no urinary frequency or urgency.  She feels that she could drink more water and does not drink as much as she should but has no history of nausea vomiting or diarrhea recently.  Grandson reports difficulty with her short-term memory.  Her memory appears to be intact. ED Course: In the emergency department the patient was fully evaluated a CT scan was obtained did not show any acute or subacute stroke and no acute abnormality.  She had mildly progressive atrophy and chronic small vessel white matter ischemic changes.  She was referred to neurology who felt the patient would benefit from admission for stroke evaluation.    Hospital Course:  Left-sided weakness/acute encephalopathy due to TIA -Weakness improved, mental status is clearing up and she is now back to baseline, MRI of the brain without acute CVA. Neurology following, underwent a C-spine MRI which did not show any significant findings but did show diffuse degenerative changes. She has been on aspirin in the past but self discontinued. Continue ASA on discharge. 2D echo without major findings as below HLD -LDL elevated to 175, start statin on d/c, heart healthy diet Type 2 diabetes mellitus with nephropathy -Hemoglobin A1c 6.5, continue diet control. CBGs 4 times daily, start sliding scale if needed Chronic kidney disease stage III -Creatinine is stable Hypertension -started on Norvasc, continue to monitor BP and adjust medications as clinically indicated. Suspect chronically  quite elevated  Intermittent nausea - chronic, Zofran prn  Discharge Diagnoses:  Active Problems:   Uncontrolled type 2 diabetes mellitus with nephropathy (HCC)   Hypertension complicating diabetes (HCC)   Hypokalemia   Acute metabolic encephalopathy   Left-sided weakness   Discharge Instructions  Allergies as of 05/17/2017   No  Known Allergies     Medication List    TAKE these medications   amLODipine 5 MG tablet Commonly known as:  NORVASC Take 1 tablet (5 mg total) by mouth daily.   aspirin 325 MG tablet Take 1 tablet (325 mg total) by mouth daily.   atorvastatin 40 MG tablet Commonly known as:  LIPITOR Take 1 tablet (40 mg total) by mouth daily at 6 PM.   ondansetron 4 MG disintegrating tablet Commonly known as:  ZOFRAN-ODT Take 1 tablet (4 mg total) by mouth every 8 (eight) hours as needed for nausea or vomiting.       Consultations:  Neurology   Procedures/Studies:  2D echo  Study Conclusions - Left ventricle: There was mild focal basal hypertrophy of the septum. Systolic function was normal. The estimated ejection fraction was in the range of 60% to 65%. Doppler parameters are consistent with both elevated ventricular end-diastolic filling pressure and elevated left atrial filling pressure. - Mitral valve: There was mild regurgitation.  Ct Head Wo Contrast  Result Date: 05/13/2017 CLINICAL DATA:  New onset generalized weakness today. EXAM: CT HEAD WITHOUT CONTRAST TECHNIQUE: Contiguous axial images were obtained from the base of the skull through the vertex without intravenous contrast. COMPARISON:  10/09/2007. FINDINGS: Brain: Diffusely enlarged ventricles and subarachnoid spaces. Patchy white matter low density in both cerebral hemispheres. No intracranial hemorrhage, mass lesion or CT evidence of acute infarction. Vascular: No hyperdense vessel or unexpected calcification. Skull: Normal. Negative for fracture or focal lesion. Sinuses/Orbits: No acute finding. Other: None. IMPRESSION: 1. No acute abnormality. 2. Mildly progressive atrophy and chronic small vessel white matter ischemic changes. Electronically Signed   By: Beckie Salts M.D.   On: 05/13/2017 21:15   [name]  Result Date: 05/14/2017 CLINICAL DATA:  Left-sided lower extremity weakness.  Confusion. EXAM: MRI HEAD  WITHOUT CONTRAST TECHNIQUE: Multiplanar, multiecho pulse sequences of the brain and surrounding structures were obtained without intravenous contrast. COMPARISON:  Head CT 05/13/2017 FINDINGS: BRAIN: Partially empty sella. There is no acute infarct or acute hemorrhage. No mass lesion, hydrocephalus, dural abnormality or extra-axial collection. Diffuse confluent hyperintense T2-weighted signal within the periventricular, deep and juxtacortical white matter, most commonly due to chronic ischemic microangiopathy. Old lacunar infarcts of the left pons and right basal ganglia. Generalized atrophy without lobar predilection. Chronic microhemorrhage in the left cerebellum. Bilateral basal ganglia mineralization. VASCULAR: Major intracranial arterial and venous sinus flow voids are preserved. SKULL AND UPPER CERVICAL SPINE: The visualized skull base, calvarium, upper cervical spine and extracranial soft tissues are normal. SINUSES/ORBITS: No fluid levels or advanced mucosal thickening. No mastoid or middle ear effusion. Normal orbits. IMPRESSION: 1. No acute intracranial abnormality. 2. Advanced chronic ischemic microangiopathic changes and multiple old lacunar infarcts. Electronically Signed   By: Deatra Robinson M.D.   On: 05/14/2017 03:07   Mr Cervical Spine Wo Contrast  Result Date: 05/14/2017 CLINICAL DATA:  84 y/o  F; left-sided weakness and confusion. EXAM: MRI CERVICAL SPINE WITHOUT CONTRAST TECHNIQUE: Multiplanar, multisequence MR imaging of the cervical spine was performed. No intravenous contrast was administered. COMPARISON:  05/14/2017 MRI of the head. FINDINGS: Alignment: Physiologic. Vertebrae: No fracture, evidence  of discitis, or bone lesion. Cord: Normal signal and morphology. Posterior Fossa, vertebral arteries, paraspinal tissues: Chronic lacunar infarction within the pons. Disc levels: C2-3: No significant disc displacement, foraminal stenosis, or canal stenosis. Left-greater-than-right facet  hypertrophy. C3-4: Mild disc osteophyte complex with facet and uncovertebral hypertrophy. Mild foraminal stenosis. C4-5: Mild disc bulge with uncovertebral and facet hypertrophy. Mild foraminal stenosis. C5-6: Moderate disc osteophyte complex with uncovertebral and facet hypertrophy and prominent ligamentum flavum hypertrophy. Moderate bilateral foraminal and mild canal stenosis. C6-7: Moderate disc osteophyte complex with left-greater-than-right uncovertebral and facet hypertrophy. Mild left foraminal stenosis and mild canal stenosis. C7-T1: Small disc bulge and facet hypertrophy. No significant foraminal or canal stenosis. T1-2: Moderate disc bulge and facet hypertrophy greater on the right with moderate bilateral foraminal stenosis. No canal stenosis. IMPRESSION: 1. No acute osseous or cord signal abnormality. 2. Cervical spondylosis with multilevel disc and facet degenerative changes greatest at the C5-6 and C6-7 levels. 3. Mild C5-6 and C6-7 canal stenosis.  No high-grade canal stenosis. 4. Moderate C5-6 and T1-2 foraminal stenosis. Multilevel mild foraminal stenosis. Electronically Signed   By: Mitzi Hansen M.D.   On: 05/14/2017 21:12     Subjective: - no chest pain, shortness of breath, no abdominal pain, nausea or vomiting.   Discharge Exam: Vitals:   05/17/17 0558 05/17/17 0746  BP: (!) 151/86 (!) 178/106  Pulse: 80 84  Resp: 16 18  Temp: 97.7 F (36.5 C) 97.7 F (36.5 C)  SpO2: 100% 99%    General: Pt is alert, awake, not in acute distress Cardiovascular: RRR, S1/S2 +, no rubs, no gallops Respiratory: CTA bilaterally, no wheezing, no rhonchi Abdominal: Soft, NT, ND, bowel sounds + Extremities: no edema, no cyanosis   The results of significant diagnostics from this hospitalization (including imaging, microbiology, ancillary and laboratory) are listed below for reference.     Microbiology: No results found for this or any previous visit (from the past 240 hour(s)).     Labs: BNP (last 3 results) No results for input(s): BNP in the last 8760 hours. Basic Metabolic Panel: Recent Labs  Lab 05/13/17 2031 05/14/17 0212  NA 140  --   K 3.2*  --   CL 102  --   CO2 24  --   GLUCOSE 246*  --   BUN 28*  --   CREATININE 1.20* 1.22*  CALCIUM 10.0  --    Liver Function Tests: No results for input(s): AST, ALT, ALKPHOS, BILITOT, PROT, ALBUMIN in the last 168 hours. No results for input(s): LIPASE, AMYLASE in the last 168 hours. No results for input(s): AMMONIA in the last 168 hours. CBC: Recent Labs  Lab 05/13/17 2031 05/14/17 0212  WBC 6.7 6.9  NEUTROABS 4.5  --   HGB 12.1 11.8*  HCT 37.5 36.8  MCV 81.3 81.6  PLT 224 206   Cardiac Enzymes: No results for input(s): CKTOTAL, CKMB, CKMBINDEX, TROPONINI in the last 168 hours. BNP: Invalid input(s): POCBNP CBG: Recent Labs  Lab 05/16/17 0657 05/16/17 1109 05/16/17 1559 05/16/17 2219 05/17/17 0656  GLUCAP 99 137* 90 162* 109*   D-Dimer No results for input(s): DDIMER in the last 72 hours. Hgb A1c No results for input(s): HGBA1C in the last 72 hours. Lipid Profile No results for input(s): CHOL, HDL, LDLCALC, TRIG, CHOLHDL, LDLDIRECT in the last 72 hours. Thyroid function studies No results for input(s): TSH, T4TOTAL, T3FREE, THYROIDAB in the last 72 hours.  Invalid input(s): FREET3 Anemia work up No results for input(s): VITAMINB12,  FOLATE, FERRITIN, TIBC, IRON, RETICCTPCT in the last 72 hours. Urinalysis    Component Value Date/Time   COLORURINE AMBER (A) 05/13/2017 2015   APPEARANCEUR CLOUDY (A) 05/13/2017 2015   LABSPEC 1.025 05/13/2017 2015   PHURINE 5.0 05/13/2017 2015   GLUCOSEU NEGATIVE 05/13/2017 2015   HGBUR NEGATIVE 05/13/2017 2015   BILIRUBINUR SMALL (A) 05/13/2017 2015   KETONESUR 20 (A) 05/13/2017 2015   PROTEINUR 100 (A) 05/13/2017 2015   NITRITE NEGATIVE 05/13/2017 2015   LEUKOCYTESUR TRACE (A) 05/13/2017 2015   Sepsis Labs Invalid input(s): PROCALCITONIN,   WBC,  LACTICIDVEN   Time coordinating discharge: 35 minutes  SIGNED:  Pamella Pert, MD  [facility] 05/17/2017, 9:50 AM Pager [phone]  If 7PM-7AM, please contact night-coverage [email or web address] Password [email or web address]

## 2017-05-19 DIAGNOSIS — I10 Essential (primary) hypertension: Secondary | ICD-10-CM | POA: Diagnosis not present

## 2017-05-19 DIAGNOSIS — R531 Weakness: Secondary | ICD-10-CM | POA: Diagnosis not present

## 2017-05-19 DIAGNOSIS — R630 Anorexia: Secondary | ICD-10-CM | POA: Diagnosis not present

## 2017-05-19 DIAGNOSIS — G459 Transient cerebral ischemic attack, unspecified: Secondary | ICD-10-CM | POA: Diagnosis not present

## 2017-05-20 DIAGNOSIS — I10 Essential (primary) hypertension: Secondary | ICD-10-CM | POA: Diagnosis not present

## 2017-05-20 DIAGNOSIS — G459 Transient cerebral ischemic attack, unspecified: Secondary | ICD-10-CM | POA: Diagnosis not present

## 2017-05-20 DIAGNOSIS — R410 Disorientation, unspecified: Secondary | ICD-10-CM | POA: Diagnosis not present

## 2017-05-20 DIAGNOSIS — E785 Hyperlipidemia, unspecified: Secondary | ICD-10-CM | POA: Diagnosis not present

## 2017-05-23 DIAGNOSIS — R1310 Dysphagia, unspecified: Secondary | ICD-10-CM | POA: Diagnosis not present

## 2017-05-27 DIAGNOSIS — E785 Hyperlipidemia, unspecified: Secondary | ICD-10-CM | POA: Diagnosis not present

## 2017-05-27 DIAGNOSIS — R441 Visual hallucinations: Secondary | ICD-10-CM | POA: Diagnosis not present

## 2017-05-27 DIAGNOSIS — R1310 Dysphagia, unspecified: Secondary | ICD-10-CM | POA: Diagnosis not present

## 2017-05-27 DIAGNOSIS — I10 Essential (primary) hypertension: Secondary | ICD-10-CM | POA: Diagnosis not present

## 2017-05-28 ENCOUNTER — Encounter: Payer: Self-pay | Admitting: Podiatry

## 2017-05-28 ENCOUNTER — Ambulatory Visit: Payer: Medicare HMO | Admitting: Podiatry

## 2017-05-28 VITALS — BP 155/109 | HR 86

## 2017-05-28 DIAGNOSIS — E1159 Type 2 diabetes mellitus with other circulatory complications: Secondary | ICD-10-CM

## 2017-05-28 DIAGNOSIS — B351 Tinea unguium: Secondary | ICD-10-CM | POA: Diagnosis not present

## 2017-05-28 DIAGNOSIS — M79675 Pain in left toe(s): Secondary | ICD-10-CM

## 2017-05-28 DIAGNOSIS — M79674 Pain in right toe(s): Secondary | ICD-10-CM | POA: Diagnosis not present

## 2017-05-28 DIAGNOSIS — E1142 Type 2 diabetes mellitus with diabetic polyneuropathy: Secondary | ICD-10-CM

## 2017-05-28 NOTE — Progress Notes (Signed)
This patient presents to the office with chief complaint of long thick nails and diabetic feet.  This patient  says there  is  no pain and discomfort in their feet.  This patient says there are long thick painful nails.  These nails are painful walking and wearing shoes.  Patient has no history of infection or drainage from both feet.  Patient is unable to  self treat his own nails . This patient presents  to the office today for treatment of the  long nails and a foot evaluation due to history of  diabetes. She is accompanied to the office for this visit.    General Appearance  Alert, conversant and in no acute stress.  Vascular  Dorsalis pedis and posterior tibial  pulses are not  palpable  bilaterally.  Capillary return is within normal limits  bilaterally. Temperature is within normal limits  bilaterally.  Neurologic  Senn-Weinstein monofilament wire test absent  bilaterally. Muscle power within normal limits bilaterally.  Nails Thick disfigured discolored nails with subungual debris  from hallux to fifth toes bilaterally. No evidence of bacterial infection or drainage bilaterally.  Orthopedic  No limitations  of motion feet .  No crepitus or effusions noted.  No bony pathology or digital deformities noted.  Skin  normotropic skin with no porokeratosis noted bilaterally.  No signs of infections or ulcers noted.     Onychomycosis  Diabetes with no foot complications  IE  Debride nails x 10.  A diabetic foot exam was performed and there is  evidence of any vascular or neurologic pathology.   RTC 3 months.   Helane Gunther DPM

## 2017-05-29 DIAGNOSIS — I10 Essential (primary) hypertension: Secondary | ICD-10-CM | POA: Diagnosis not present

## 2017-05-29 DIAGNOSIS — E119 Type 2 diabetes mellitus without complications: Secondary | ICD-10-CM | POA: Diagnosis not present

## 2017-05-29 DIAGNOSIS — G459 Transient cerebral ischemic attack, unspecified: Secondary | ICD-10-CM | POA: Diagnosis not present

## 2017-05-29 DIAGNOSIS — R531 Weakness: Secondary | ICD-10-CM | POA: Diagnosis not present

## 2017-05-30 DIAGNOSIS — I10 Essential (primary) hypertension: Secondary | ICD-10-CM | POA: Diagnosis not present

## 2017-05-31 DIAGNOSIS — E1140 Type 2 diabetes mellitus with diabetic neuropathy, unspecified: Secondary | ICD-10-CM | POA: Diagnosis not present

## 2017-05-31 DIAGNOSIS — I69354 Hemiplegia and hemiparesis following cerebral infarction affecting left non-dominant side: Secondary | ICD-10-CM | POA: Diagnosis not present

## 2017-05-31 DIAGNOSIS — N183 Chronic kidney disease, stage 3 (moderate): Secondary | ICD-10-CM | POA: Diagnosis not present

## 2017-05-31 DIAGNOSIS — E785 Hyperlipidemia, unspecified: Secondary | ICD-10-CM | POA: Diagnosis not present

## 2017-05-31 DIAGNOSIS — E1165 Type 2 diabetes mellitus with hyperglycemia: Secondary | ICD-10-CM | POA: Diagnosis not present

## 2017-05-31 DIAGNOSIS — I129 Hypertensive chronic kidney disease with stage 1 through stage 4 chronic kidney disease, or unspecified chronic kidney disease: Secondary | ICD-10-CM | POA: Diagnosis not present

## 2017-06-02 ENCOUNTER — Encounter: Payer: Self-pay | Admitting: Family Medicine

## 2017-06-02 ENCOUNTER — Telehealth: Payer: Self-pay

## 2017-06-02 ENCOUNTER — Ambulatory Visit (INDEPENDENT_AMBULATORY_CARE_PROVIDER_SITE_OTHER): Payer: Medicare HMO | Admitting: Family Medicine

## 2017-06-02 ENCOUNTER — Other Ambulatory Visit: Payer: Self-pay

## 2017-06-02 VITALS — BP 136/70 | HR 93 | Temp 98.6°F | Ht 60.0 in | Wt 146.8 lb

## 2017-06-02 DIAGNOSIS — E1122 Type 2 diabetes mellitus with diabetic chronic kidney disease: Secondary | ICD-10-CM

## 2017-06-02 DIAGNOSIS — Z23 Encounter for immunization: Secondary | ICD-10-CM

## 2017-06-02 DIAGNOSIS — E785 Hyperlipidemia, unspecified: Secondary | ICD-10-CM | POA: Insufficient documentation

## 2017-06-02 DIAGNOSIS — E118 Type 2 diabetes mellitus with unspecified complications: Secondary | ICD-10-CM

## 2017-06-02 DIAGNOSIS — I6300 Cerebral infarction due to thrombosis of unspecified precerebral artery: Secondary | ICD-10-CM

## 2017-06-02 DIAGNOSIS — N183 Chronic kidney disease, stage 3 unspecified: Secondary | ICD-10-CM

## 2017-06-02 DIAGNOSIS — I10 Essential (primary) hypertension: Secondary | ICD-10-CM

## 2017-06-02 DIAGNOSIS — E1159 Type 2 diabetes mellitus with other circulatory complications: Secondary | ICD-10-CM | POA: Diagnosis not present

## 2017-06-02 DIAGNOSIS — E7841 Elevated Lipoprotein(a): Secondary | ICD-10-CM

## 2017-06-02 DIAGNOSIS — R69 Illness, unspecified: Secondary | ICD-10-CM | POA: Diagnosis not present

## 2017-06-02 HISTORY — DX: Hyperlipidemia, unspecified: E78.5

## 2017-06-02 MED ORDER — CANDESARTAN CILEXETIL 4 MG PO TABS: 4.0000 mg | ORAL_TABLET | Freq: Every day | ORAL | 0 refills | Status: DC

## 2017-06-02 MED ORDER — GLUCOSE BLOOD VI STRP: ORAL_STRIP | 12 refills | Status: DC

## 2017-06-02 MED ORDER — ONETOUCH DELICA LANCING DEV MISC: 1.0000 | Freq: Every day | 3 refills | Status: DC | PRN

## 2017-06-02 MED ORDER — ONETOUCH VERIO W/DEVICE KIT: 1.0000 [IU] | PACK | Freq: Once | 0 refills | Status: AC

## 2017-06-02 MED ORDER — ASPIRIN 325 MG PO TABS: 325.0000 mg | ORAL_TABLET | Freq: Every day | ORAL | Status: DC

## 2017-06-02 MED ORDER — ONETOUCH DELICA LANCETS 33G MISC: 1.0000 [IU] | Freq: Every day | 1 refills | Status: DC | PRN

## 2017-06-02 NOTE — Progress Notes (Signed)
CC: new patient   HPI  Patient's granddaughter is our red [name], [name]. Mary Walters has not seen a doctor for 5 years, and DIL brought her to ED for new L sided weakness. Personally reviewed ED notes, hosp course, and D'c summ. Left SNF on 5/17, did well there. Has bubble packs of her meds today with her.   DIL goes TID to supervise and fix meals. DIL pays bills and shops for her.   DM2 - hx of same, last a1c 6.5. No meter at all. Not checking her CBGs at home. DIL is concerned about her CBGs because they were checking CBGs at SNF and they don't have a way to do this at home. Requests POCT check today. DIL fixes her meals, tries to limit sugar. Patient states she still "wants something sweet sometimes."   HTN - DIL is limiting salt in food. Taking norvasc. Hx of same but untreated for many years. Patient says, "I am my own doctor."   oncomycosis - saw podiatry last week.   CVA - no hx of known CVAs, MRI suggestive of old infarcts. DIL initially thought she had an "aneursym that we caught in time," I reviewed etiology of TIA vs CVA and confirmed no aneurysm on recent imaging.   CKD - never seen a kidney doctor. Did not know she had this  Memory - DIL says she repeats herself frequently. DIL feels she forgets things.   HLD- taking statin nightly, no concerns about side effects.   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   CC, SH/smoking status, and VS noted  Objective: BP 136/70   Pulse 93   Temp 98.6 F (37 C) (Oral)   Ht 5' (1.524 m)   Wt 146 lb 12.8 oz (66.6 kg)   SpO2 98%   BMI 28.67 kg/m  Gen: NAD,  alert, cooperative, and pleasant elderly female.  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 No residual deficits on my exam today.   Type 2 diabetes mellitus with stage 3 chronic kidney disease, without long-term current use of insulin (HCC) a1c controlled. Ordered meter, strips, lancets to check PRN symptoms or daily as family desires. Recheck 3 months.   Hypertension complicating diabetes ([place]) Change amlodipine to ARB for renal protection and 2/2 DM. Recheck BMP for K and Cr in 10 days, future order placed. Recheck BP in 1 month or prior if concerns.   HLD (hyperlipidemia) Taking statin, recheck lipids in 6 weeks. No side effects.   CKD (chronic kidney disease), stage III (HCC) Likely 2/2 longstanding HTN and DM. Recheck BMP in 10 days after  starting ARB.    Orders Placed This Encounter  Procedures  . Pneumococcal conjugate vaccine 13-valent IM  . Tdap vaccine greater than or equal to 7yo IM  . CBC    Standing Status:   Future    Number of Occurrences:   1    Standing Expiration Date:   06/03/2018  . Basic metabolic panel    Standing Status:   Future    Number of Occurrences:   1    Standing Expiration Date:   06/03/2018  . POCT glucose (manual entry)    Meds ordered this encounter  Medications  . aspirin 325 MG tablet    Sig: Take 1 tablet (325 mg total) by mouth daily.  . candesartan (ATACAND) 4 MG tablet    Sig: Take 1  tablet (4 mg total) by mouth daily.    Dispense:  90 tablet    Refill:  0  . glucose blood test strip    Sig: Use as instructed    Dispense:  100 each    Refill:  12  . Blood Glucose Monitoring Suppl (ONETOUCH VERIO) w/Device KIT    Sig: 1 Units by Does not apply route once for 1 dose.    Dispense:  1 kit    Refill:  0  . Lancet Devices (ONE TOUCH DELICA LANCING DEV) MISC    Sig: 1 Device by Does not apply route daily as needed.    Dispense:  1 each    Refill:  3  . ONETOUCH DELICA LANCETS 78M MISC    Sig: 1 Units by Does not apply route daily as needed.    Dispense:  100 each    Refill:  1   Lab missed POCT glucose, no symptoms today. Will check on BMP later this week, patient to pick up meter today.   Health Maintenance: foot exam performed, asked to schedule eye exam.   Ralene Ok, MD, PGY2 06/02/2017 2:06 PM

## 2017-06-02 NOTE — Assessment & Plan Note (Signed)
Taking statin, recheck lipids in 6 weeks. No side effects.

## 2017-06-02 NOTE — Assessment & Plan Note (Signed)
Likely 2/2 longstanding HTN and DM. Recheck BMP in 10 days after starting ARB.

## 2017-06-02 NOTE — Assessment & Plan Note (Signed)
a1c controlled. Ordered meter, strips, lancets to check PRN symptoms or daily as family desires. Recheck 3 months.

## 2017-06-02 NOTE — Assessment & Plan Note (Signed)
Old CVAs on MRI in recent admission. No residual deficits on my exam today.

## 2017-06-02 NOTE — Patient Instructions (Signed)
It was a pleasure to see you today! Thank you for choosing [facility] for your primary care. Mary Walters was seen for new patient.   Our plans for today were:  It was wonderful to meet you!  Make sure she is taking the aspirin every day.   Change the amlodipine to candesartan. Come back in 10 days to recheck her blood levels.   I sent a prescription for a blood sugar meter to the pharmacy.   You should return to our clinic to see Dr. Chanetta Marshall in 1 month for blood pressure recheck.   Best,  Dr. Chanetta Marshall

## 2017-06-02 NOTE — Assessment & Plan Note (Signed)
Change amlodipine to ARB for renal protection and 2/2 DM. Recheck BMP for K and Cr in 10 days, future order placed. Recheck BP in 1 month or prior if concerns.

## 2017-06-02 NOTE — Telephone Encounter (Signed)
Charlynne Pander- PT [facility] calling for verbal orders.  2x week for 8 weeks. Also requesting nursing eval and a HH aid. Call back [phone] Shawna Orleans, RN

## 2017-06-03 LAB — CBC

## 2017-06-03 LAB — BASIC METABOLIC PANEL

## 2017-06-13 ENCOUNTER — Telehealth: Payer: Self-pay

## 2017-06-13 NOTE — Telephone Encounter (Signed)
Faxed [id number] [facility]. Orders placed in PCP's box for signature. Please return to nurse clinic when complete.  Ples SpecterAlisa Brake, RN [facility] RN)

## 2017-06-13 NOTE — Telephone Encounter (Signed)
Fine to give these verbal orders. Although [facility]. Could you clarify with them?

## 2017-06-13 NOTE — Telephone Encounter (Signed)
Left a generic message to call office back to inform Cara of below and to inquire about pt declining HH per Dr. Chanetta Marshall. Lamonte Sakai, Devonne Lalani D, [place]

## 2017-06-14 DIAGNOSIS — N183 Chronic kidney disease, stage 3 (moderate): Secondary | ICD-10-CM | POA: Diagnosis not present

## 2017-06-14 DIAGNOSIS — E1140 Type 2 diabetes mellitus with diabetic neuropathy, unspecified: Secondary | ICD-10-CM | POA: Diagnosis not present

## 2017-06-14 DIAGNOSIS — G8194 Hemiplegia, unspecified affecting left nondominant side: Secondary | ICD-10-CM | POA: Diagnosis not present

## 2017-06-16 NOTE — Telephone Encounter (Signed)
[facility]

## 2017-06-16 NOTE — Telephone Encounter (Signed)
Verbal orders given to [place].  She was not sure about the declination of HH, she said it may be because they are already providing this. Lamonte SakaiZimmerman Rumple, Chisa Kushner D, [place]

## 2017-06-17 NOTE — Telephone Encounter (Signed)
Signed [facility] at [phone].  Ples SpecterAlisa Cristie Mckinney, RN [facility] RN)

## 2017-06-18 ENCOUNTER — Encounter: Payer: Self-pay | Admitting: Family Medicine

## 2017-06-18 ENCOUNTER — Ambulatory Visit (INDEPENDENT_AMBULATORY_CARE_PROVIDER_SITE_OTHER): Payer: Medicare HMO | Admitting: Family Medicine

## 2017-06-18 VITALS — BP 135/70 | HR 96 | Temp 98.3°F | Wt 139.4 lb

## 2017-06-18 DIAGNOSIS — E1159 Type 2 diabetes mellitus with other circulatory complications: Secondary | ICD-10-CM

## 2017-06-18 DIAGNOSIS — I10 Essential (primary) hypertension: Secondary | ICD-10-CM | POA: Diagnosis not present

## 2017-06-18 DIAGNOSIS — R202 Paresthesia of skin: Secondary | ICD-10-CM | POA: Diagnosis not present

## 2017-06-18 LAB — GLUCOSE, POCT (MANUAL RESULT ENTRY): POC GLUCOSE: 160 mg/dL — AB (ref 70–99)

## 2017-06-18 NOTE — Progress Notes (Signed)
   CC: med managment  HPI  HTN - picked up candesartan, has been taking this without issue. No feeling faint. Stopped norvasc after she gave it to me last visit to dispose of.   Hasn't been checking CBGs at home, but was able to get the meter. No feeling unwell.   Notes some intermittent L forefinger numbness, occasionally this is in her R forefinger as well. Lasts <1 min, never had this before. No numbness in any other fingers.   ROS: Denies CP, SOB, abdominal pain, dysuria, changes in BMs.   CC, SH/smoking status, and VS noted  Objective: BP 135/70 (BP Location: Right Arm, Patient Position: Sitting, Cuff Size: Normal)   Pulse 96   Temp 98.3 F (36.8 C) (Oral)   Wt 139 lb 6.4 oz (63.2 kg)   SpO2 99%   BMI 27.22 kg/m  Gen: NAD, alert, cooperative, and pleasant. HEENT: NCAT, EOMI, PERRL CV: RRR, no murmur Resp: CTAB, no wheezes, non-labored Ext: No edema, warm. Normal grip strength in bilateral hands, normal sensation over arms. Negative phalens.  Neuro: Alert and oriented, Speech clear, No gross deficits  Assessment and plan:  Hypertension complicating diabetes (HCC) Patient did not get follow up labs after starting arb, will check BMP today. BP at goal, continue candesartan as long as K and Cr ok.  Finger numbness - unclear etiology, will check CBC for anemia, b12 and TSH. If normal, will continue to monitor for persistence or worsening, patient to return if this keeps bothering her.   Orders Placed This Encounter  Procedures  . CBC  . Basic metabolic panel  . Vitamin B12  . TSH  . Glucose (CBG)    No orders of the defined types were placed in this encounter.   Loni MuseKate Timberlake, MD, PGY2 06/19/2017 4:54 PM

## 2017-06-18 NOTE — Patient Instructions (Signed)
It was a pleasure to see you today! Thank you for choosing [facility] for your primary care. Mary Walters was seen for blood pressure recheck.   Our plans for today were:  Keep up the good work!   You should return to our clinic to see Dr. Chanetta Marshallimberlake in 6 months for physical.   Best,  Dr. Chanetta Marshallimberlake

## 2017-06-19 LAB — BASIC METABOLIC PANEL WITH GFR
BUN/Creatinine Ratio: 19 (ref 12–28)
BUN: 15 mg/dL (ref 8–27)
CO2: 23 mmol/L (ref 20–29)
Calcium: 9.5 mg/dL (ref 8.7–10.3)
Chloride: 102 mmol/L (ref 96–106)
Creatinine, Ser: 0.79 mg/dL (ref 0.57–1.00)
GFR calc Af Amer: 78 mL/min/1.73
GFR calc non Af Amer: 68 mL/min/1.73
Glucose: 148 mg/dL — ABNORMAL HIGH (ref 65–99)
Potassium: 4.1 mmol/L (ref 3.5–5.2)
Sodium: 140 mmol/L (ref 134–144)

## 2017-06-19 LAB — CBC
HEMOGLOBIN: 11.4 g/dL (ref 11.1–15.9)
Hematocrit: 37.2 % (ref 34.0–46.6)
MCH: 25.1 pg — ABNORMAL LOW (ref 26.6–33.0)
MCHC: 30.6 g/dL — ABNORMAL LOW (ref 31.5–35.7)
MCV: 82 fL (ref 79–97)
Platelets: 316 10*3/uL (ref 150–450)
RBC: 4.54 x10E6/uL (ref 3.77–5.28)
RDW: 16.9 % — ABNORMAL HIGH (ref 12.3–15.4)
WBC: 5.2 10*3/uL (ref 3.4–10.8)

## 2017-06-19 LAB — TSH: TSH: 1.59 u[IU]/mL (ref 0.450–4.500)

## 2017-06-19 LAB — VITAMIN B12: Vitamin B-12: 396 pg/mL (ref 232–1245)

## 2017-06-19 NOTE — Assessment & Plan Note (Signed)
Patient did not get follow up labs after starting arb, will check BMP today. BP at goal, continue candesartan as long as K and Cr ok.

## 2017-06-23 ENCOUNTER — Ambulatory Visit: Payer: Medicare HMO | Admitting: Family Medicine

## 2017-06-24 ENCOUNTER — Telehealth: Payer: Self-pay

## 2017-06-24 NOTE — Telephone Encounter (Signed)
Informed Mary Walters of results for patient. Left voice mail to call back if there were additional concerns.  Glennie Hawk.Simpson, Michelle R, CMA

## 2017-07-10 ENCOUNTER — Other Ambulatory Visit: Payer: Self-pay | Admitting: *Deleted

## 2017-07-10 NOTE — Telephone Encounter (Signed)
Pt daughter called in reference to this refill. Pt was given this in rehab and was not sure if you wanted her to stay on it.  I did this as a refill but did not see any quantity given on the refill so you will need to fix it and any refills if you would like for her to stay on it. Lamonte SakaiZimmerman Rumple, April D, [place]

## 2017-07-11 MED ORDER — ATORVASTATIN CALCIUM 40 MG PO TABS: 40.0000 mg | ORAL_TABLET | Freq: Every day | ORAL | 2 refills | Status: DC

## 2017-07-30 DIAGNOSIS — E1140 Type 2 diabetes mellitus with diabetic neuropathy, unspecified: Secondary | ICD-10-CM | POA: Diagnosis not present

## 2017-07-30 DIAGNOSIS — I69354 Hemiplegia and hemiparesis following cerebral infarction affecting left non-dominant side: Secondary | ICD-10-CM | POA: Diagnosis not present

## 2017-07-30 DIAGNOSIS — N183 Chronic kidney disease, stage 3 (moderate): Secondary | ICD-10-CM | POA: Diagnosis not present

## 2017-07-30 DIAGNOSIS — E1165 Type 2 diabetes mellitus with hyperglycemia: Secondary | ICD-10-CM | POA: Diagnosis not present

## 2017-07-30 DIAGNOSIS — E785 Hyperlipidemia, unspecified: Secondary | ICD-10-CM | POA: Diagnosis not present

## 2017-07-30 DIAGNOSIS — I129 Hypertensive chronic kidney disease with stage 1 through stage 4 chronic kidney disease, or unspecified chronic kidney disease: Secondary | ICD-10-CM | POA: Diagnosis not present

## 2017-08-05 DIAGNOSIS — I69354 Hemiplegia and hemiparesis following cerebral infarction affecting left non-dominant side: Secondary | ICD-10-CM | POA: Diagnosis not present

## 2017-08-05 DIAGNOSIS — E1140 Type 2 diabetes mellitus with diabetic neuropathy, unspecified: Secondary | ICD-10-CM | POA: Diagnosis not present

## 2017-08-05 DIAGNOSIS — I129 Hypertensive chronic kidney disease with stage 1 through stage 4 chronic kidney disease, or unspecified chronic kidney disease: Secondary | ICD-10-CM | POA: Diagnosis not present

## 2017-08-05 DIAGNOSIS — E1165 Type 2 diabetes mellitus with hyperglycemia: Secondary | ICD-10-CM | POA: Diagnosis not present

## 2017-08-05 DIAGNOSIS — N183 Chronic kidney disease, stage 3 (moderate): Secondary | ICD-10-CM | POA: Diagnosis not present

## 2017-08-05 DIAGNOSIS — E785 Hyperlipidemia, unspecified: Secondary | ICD-10-CM | POA: Diagnosis not present

## 2017-08-06 ENCOUNTER — Ambulatory Visit (INDEPENDENT_AMBULATORY_CARE_PROVIDER_SITE_OTHER): Payer: Medicare HMO | Admitting: Family Medicine

## 2017-08-06 ENCOUNTER — Encounter: Payer: Self-pay | Admitting: Family Medicine

## 2017-08-06 ENCOUNTER — Other Ambulatory Visit: Payer: Self-pay

## 2017-08-06 VITALS — BP 156/92 | HR 72 | Temp 98.0°F | Ht 60.0 in | Wt 141.0 lb

## 2017-08-06 DIAGNOSIS — E1159 Type 2 diabetes mellitus with other circulatory complications: Secondary | ICD-10-CM

## 2017-08-06 DIAGNOSIS — I10 Essential (primary) hypertension: Secondary | ICD-10-CM

## 2017-08-06 DIAGNOSIS — N183 Chronic kidney disease, stage 3 (moderate): Secondary | ICD-10-CM

## 2017-08-06 DIAGNOSIS — R413 Other amnesia: Secondary | ICD-10-CM | POA: Insufficient documentation

## 2017-08-06 DIAGNOSIS — E1122 Type 2 diabetes mellitus with diabetic chronic kidney disease: Secondary | ICD-10-CM

## 2017-08-06 DIAGNOSIS — I152 Hypertension secondary to endocrine disorders: Secondary | ICD-10-CM

## 2017-08-06 LAB — POCT GLYCOSYLATED HEMOGLOBIN (HGB A1C): HbA1c, POC (controlled diabetic range): 6.4 % (ref 0.0–7.0)

## 2017-08-06 MED ORDER — ATORVASTATIN CALCIUM 40 MG PO TABS: 40.0000 mg | ORAL_TABLET | Freq: Every day | ORAL | 2 refills | Status: DC

## 2017-08-06 NOTE — Progress Notes (Signed)
   CC: memory concerns  HPI  Patient is brought by her daughter-in-law, who is her primary caregiver although they live separately.  Daughter-in-law is concerned that the patient has worsening memory trouble.  She states that this morning prior to their appointment she called the patient 22 times and her husband called 7 times.  Patient states that she was in the bathroom, however this seems to be confabulation.   daughter-in-law states that she will not leave the house and does not want to go shopping like she used to.  They also note decreased desire to eat lunch and breakfast.  They have reminded the patient that she can eat snacks instead, but she has been "stubborn and hard headed."  She has been managing her own medications without a pillbox, and did not take her blood pressure medicine prior to arrival today. Doesn't want to leave the house, only left today for our appt.   Goes to bed aat 11-12p, sometimes sleeps in the chair at night No naps   DM: They request repeat A1c check, or not regularly checking CBGs at home.  Patient denies lightheadedness or clammy feelings.  Wt Readings from Last 3 Encounters:  08/06/17 141 lb (64 kg)  06/18/17 139 lb 6.4 oz (63.2 kg)  06/02/17 146 lb 12.8 oz (66.6 kg)   ROS: Denies CP, SOB, abdominal pain, dysuria, changes in BMs.   CC, SH/smoking status, and VS noted  Objective: BP (!) 156/92   Pulse 72   Temp 98 F (36.7 C) (Oral)   Ht 5' (1.524 m)   Wt 141 lb (64 kg)   SpO2 99%   BMI 27.54 kg/m  Gen: NAD, alert, cooperative, and pleasant. HEENT: NCAT, EOMI, PERRL CV: RRR, no murmur Resp: CTAB, no wheezes, non-labored Abd: SNTND, BS present, no guarding or organomegaly Ext: No edema, warm.  Neuro: Alert and oriented, Speech clear, No gross deficits  Assessment and plan:  Memory difficulties Mini cog today is 0-1 (recalled 1 word with cueing, but clock without correct number, unable to complete hands). Have discussed with family, will  ask family to come back for full memory eval with Dr. Perley JainMcDiarmid in [place] [facility]. Minimize over the counter medications, encouraged family support (shared that it is normal for patient to repeat herself, ok to repeat answers).   Hypertension complicating diabetes (HCC) BP elevated today, patient did not take her medication this morning.  Patient is not using a pillbox.  Asked daughter-in-law to please help facilitate this, they state they have 1.  Type 2 diabetes mellitus with stage 3 chronic kidney disease, without long-term current use of insulin (HCC) A1c continues to be well controlled today.  Would be hesitant to give this patient medication for her diabetes given her trouble coordinating meds and risk of hypoglycemia with poor p.o. intake.  Can space A1c's to every 6 months.    Orders Placed This Encounter  Procedures  . HgB A1c    Meds ordered this encounter  Medications  . atorvastatin (LIPITOR) 40 MG tablet    Sig: Take 1 tablet (40 mg total) by mouth daily at 6 PM.    Dispense:  90 tablet    Refill:  2    Loni MuseKate Sandford Diop, MD, PGY3 08/07/2017 8:46 AM

## 2017-08-06 NOTE — Assessment & Plan Note (Addendum)
Mini cog today is 0-1 (recalled 1 word with cueing, but clock without correct number, unable to complete hands). Have discussed with family, will ask family to come back for full memory eval with Dr. Perley [facility]McDiarmid in [place] [facility]. Minimize over the counter medications, encouraged family support (shared that it is normal for patient to repeat herself, ok to repeat answers).

## 2017-08-06 NOTE — Patient Instructions (Signed)
It was a pleasure to see you today! Thank you for choosing [facility] for your primary care. Mary Walters was seen for memory.   Our plans for today were:  Keep your medications the same, please consider using a pill box to keep your medicines straight. I sent a refill of your cholesterol medicine.   Please come back to our geriatric clinic, to see Dr. McDiarmid. You can schedule this upfront.   Best,  Dr. Chanetta Marshallimberlake

## 2017-08-07 DIAGNOSIS — I129 Hypertensive chronic kidney disease with stage 1 through stage 4 chronic kidney disease, or unspecified chronic kidney disease: Secondary | ICD-10-CM | POA: Diagnosis not present

## 2017-08-07 DIAGNOSIS — E1140 Type 2 diabetes mellitus with diabetic neuropathy, unspecified: Secondary | ICD-10-CM | POA: Diagnosis not present

## 2017-08-07 DIAGNOSIS — N183 Chronic kidney disease, stage 3 (moderate): Secondary | ICD-10-CM | POA: Diagnosis not present

## 2017-08-07 DIAGNOSIS — I69354 Hemiplegia and hemiparesis following cerebral infarction affecting left non-dominant side: Secondary | ICD-10-CM | POA: Diagnosis not present

## 2017-08-07 DIAGNOSIS — E785 Hyperlipidemia, unspecified: Secondary | ICD-10-CM | POA: Diagnosis not present

## 2017-08-07 DIAGNOSIS — E1165 Type 2 diabetes mellitus with hyperglycemia: Secondary | ICD-10-CM | POA: Diagnosis not present

## 2017-08-07 NOTE — Assessment & Plan Note (Signed)
BP elevated today, patient did not take her medication this morning.  Patient is not using a pillbox.  Asked daughter-in-law to please help facilitate this, they state they have 1.

## 2017-08-07 NOTE — Assessment & Plan Note (Signed)
A1c continues to be well controlled today.  Would be hesitant to give this patient medication for her diabetes given her trouble coordinating meds and risk of hypoglycemia with poor p.o. intake.  Can space A1c's to every 6 months.

## 2017-08-12 DIAGNOSIS — E1165 Type 2 diabetes mellitus with hyperglycemia: Secondary | ICD-10-CM | POA: Diagnosis not present

## 2017-08-12 DIAGNOSIS — I69354 Hemiplegia and hemiparesis following cerebral infarction affecting left non-dominant side: Secondary | ICD-10-CM | POA: Diagnosis not present

## 2017-08-12 DIAGNOSIS — N183 Chronic kidney disease, stage 3 (moderate): Secondary | ICD-10-CM | POA: Diagnosis not present

## 2017-08-12 DIAGNOSIS — I129 Hypertensive chronic kidney disease with stage 1 through stage 4 chronic kidney disease, or unspecified chronic kidney disease: Secondary | ICD-10-CM | POA: Diagnosis not present

## 2017-08-12 DIAGNOSIS — E785 Hyperlipidemia, unspecified: Secondary | ICD-10-CM | POA: Diagnosis not present

## 2017-08-12 DIAGNOSIS — E1140 Type 2 diabetes mellitus with diabetic neuropathy, unspecified: Secondary | ICD-10-CM | POA: Diagnosis not present

## 2017-08-14 DIAGNOSIS — E1140 Type 2 diabetes mellitus with diabetic neuropathy, unspecified: Secondary | ICD-10-CM | POA: Diagnosis not present

## 2017-08-14 DIAGNOSIS — N183 Chronic kidney disease, stage 3 (moderate): Secondary | ICD-10-CM | POA: Diagnosis not present

## 2017-08-14 DIAGNOSIS — E1165 Type 2 diabetes mellitus with hyperglycemia: Secondary | ICD-10-CM | POA: Diagnosis not present

## 2017-08-14 DIAGNOSIS — I69354 Hemiplegia and hemiparesis following cerebral infarction affecting left non-dominant side: Secondary | ICD-10-CM | POA: Diagnosis not present

## 2017-08-14 DIAGNOSIS — I129 Hypertensive chronic kidney disease with stage 1 through stage 4 chronic kidney disease, or unspecified chronic kidney disease: Secondary | ICD-10-CM | POA: Diagnosis not present

## 2017-08-14 DIAGNOSIS — E785 Hyperlipidemia, unspecified: Secondary | ICD-10-CM | POA: Diagnosis not present

## 2017-08-18 DIAGNOSIS — E1165 Type 2 diabetes mellitus with hyperglycemia: Secondary | ICD-10-CM | POA: Diagnosis not present

## 2017-08-18 DIAGNOSIS — N183 Chronic kidney disease, stage 3 (moderate): Secondary | ICD-10-CM | POA: Diagnosis not present

## 2017-08-18 DIAGNOSIS — E785 Hyperlipidemia, unspecified: Secondary | ICD-10-CM | POA: Diagnosis not present

## 2017-08-18 DIAGNOSIS — E1140 Type 2 diabetes mellitus with diabetic neuropathy, unspecified: Secondary | ICD-10-CM | POA: Diagnosis not present

## 2017-08-18 DIAGNOSIS — I129 Hypertensive chronic kidney disease with stage 1 through stage 4 chronic kidney disease, or unspecified chronic kidney disease: Secondary | ICD-10-CM | POA: Diagnosis not present

## 2017-08-18 DIAGNOSIS — I69354 Hemiplegia and hemiparesis following cerebral infarction affecting left non-dominant side: Secondary | ICD-10-CM | POA: Diagnosis not present

## 2017-08-19 ENCOUNTER — Telehealth: Payer: Self-pay

## 2017-08-19 NOTE — Telephone Encounter (Signed)
Rn team, please give verbal orders for below. Thanks.

## 2017-08-19 NOTE — Telephone Encounter (Signed)
Maneta, PT with [facility], would like verbal orders for a social work evaluation for confusion and safety issues and to discuss LTC planning.   Call back is [phone]  Ples SpecterAlisa Brake, RN [facility] RN)

## 2017-08-20 NOTE — Telephone Encounter (Signed)
LVM to call office back to give her the requested verbal orders per Dr. Chanetta Marshallimberlake. Lamonte SakaiZimmerman Rumple, April D, [place]

## 2017-08-20 NOTE — Telephone Encounter (Signed)
Verbal orders given to [facility] Center[facility]. Ples SpecterAlisa Brake, RN [facility] RN)

## 2017-08-22 DIAGNOSIS — E1165 Type 2 diabetes mellitus with hyperglycemia: Secondary | ICD-10-CM | POA: Diagnosis not present

## 2017-08-22 DIAGNOSIS — E785 Hyperlipidemia, unspecified: Secondary | ICD-10-CM | POA: Diagnosis not present

## 2017-08-22 DIAGNOSIS — I129 Hypertensive chronic kidney disease with stage 1 through stage 4 chronic kidney disease, or unspecified chronic kidney disease: Secondary | ICD-10-CM | POA: Diagnosis not present

## 2017-08-22 DIAGNOSIS — I69354 Hemiplegia and hemiparesis following cerebral infarction affecting left non-dominant side: Secondary | ICD-10-CM | POA: Diagnosis not present

## 2017-08-22 DIAGNOSIS — E1140 Type 2 diabetes mellitus with diabetic neuropathy, unspecified: Secondary | ICD-10-CM | POA: Diagnosis not present

## 2017-08-22 DIAGNOSIS — N183 Chronic kidney disease, stage 3 (moderate): Secondary | ICD-10-CM | POA: Diagnosis not present

## 2017-08-26 ENCOUNTER — Other Ambulatory Visit: Payer: Self-pay | Admitting: Family Medicine

## 2017-08-27 ENCOUNTER — Other Ambulatory Visit: Payer: Self-pay

## 2017-08-27 ENCOUNTER — Ambulatory Visit: Payer: Medicare HMO | Admitting: Podiatry

## 2017-08-27 NOTE — Telephone Encounter (Signed)
Checking on refill for Candesartan. Please advise  Sunday SpillersSharon T Hilaria Titsworth, CMA

## 2017-09-04 ENCOUNTER — Encounter: Payer: Self-pay | Admitting: Licensed Clinical Social Worker

## 2017-09-04 ENCOUNTER — Other Ambulatory Visit: Payer: Self-pay

## 2017-09-04 ENCOUNTER — Encounter: Payer: Self-pay | Admitting: Family Medicine

## 2017-09-04 ENCOUNTER — Ambulatory Visit: Payer: Medicare HMO | Attending: Family Medicine | Admitting: Physical Therapy

## 2017-09-04 ENCOUNTER — Ambulatory Visit (INDEPENDENT_AMBULATORY_CARE_PROVIDER_SITE_OTHER): Payer: Medicare HMO | Admitting: Family Medicine

## 2017-09-04 ENCOUNTER — Encounter: Payer: Self-pay | Admitting: Physical Therapy

## 2017-09-04 VITALS — BP 144/90 | HR 91 | Temp 98.5°F | Ht 60.0 in | Wt 146.0 lb

## 2017-09-04 DIAGNOSIS — R69 Illness, unspecified: Secondary | ICD-10-CM | POA: Diagnosis not present

## 2017-09-04 DIAGNOSIS — R2681 Unsteadiness on feet: Secondary | ICD-10-CM

## 2017-09-04 DIAGNOSIS — R208 Other disturbances of skin sensation: Secondary | ICD-10-CM | POA: Insufficient documentation

## 2017-09-04 DIAGNOSIS — R413 Other amnesia: Secondary | ICD-10-CM | POA: Diagnosis not present

## 2017-09-04 DIAGNOSIS — F0150 Vascular dementia without behavioral disturbance: Secondary | ICD-10-CM

## 2017-09-04 DIAGNOSIS — E559 Vitamin D deficiency, unspecified: Secondary | ICD-10-CM

## 2017-09-04 DIAGNOSIS — N3941 Urge incontinence: Secondary | ICD-10-CM | POA: Diagnosis not present

## 2017-09-04 DIAGNOSIS — M6281 Muscle weakness (generalized): Secondary | ICD-10-CM | POA: Diagnosis not present

## 2017-09-04 DIAGNOSIS — Z23 Encounter for immunization: Secondary | ICD-10-CM

## 2017-09-04 DIAGNOSIS — B351 Tinea unguium: Secondary | ICD-10-CM | POA: Insufficient documentation

## 2017-09-04 MED ORDER — ZOSTER VAC RECOMB ADJUVANTED 50 MCG/0.5ML IM SUSR: 0.5000 mL | Freq: Once | INTRAMUSCULAR | 1 refills | Status: AC

## 2017-09-04 NOTE — Patient Instructions (Signed)

## 2017-09-04 NOTE — Progress Notes (Signed)
Type of Service: [facility] Assessment  Demographics Mary Walters is a 84 y.o. female  referred by Dr. McDiarmid review of supportive services. Patient  was accompanied by her daughter in-law Mary Walters.  LCSW met with Mary Walters while PT met with patient. Reports :no concerns with social needs today. No barriers to care identified. Family/Social Information patient lives alone ,per daughter-in-law patient does well with meeting her needs.       Other family members/support persons in your life? Son Mary Walters and daughter-in-law Mary Walters  Patient is able to complete activities of daily living. Transportation to appointments provided by family members. no Financial concerns per family. Patient enjoys reading. Caregiving Needs primary caregiver? Mary Walters daughter-in-law.  Completed Caregiver stress assessment with a score of 0 indication of minima stress. Health status of caregiver:  fair Physical Limitations:  none other care giving responsibilities? (work Social research officer, government) none Advance Directives: Living Will or [place]? No , reviewed information with caregiver Durable POA? No. ;  Reviewed with caretaker and provided resource information .  Interventions :   Psychoeducation and/or Health Education,  [employer] information offered to patient/family/caregiver:  1. Living Will and Medical POA,  2. Resources for Legal counsel, 3. Care giver Support Group   2. 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 Patient may benefit from, and family is in agreement to contact legal to assist patient with obtaining POA.   LCSW will follow-up with caretaker in 2 weeks.  Mary Lanius, LCSW Licensed Clinical Social Worker [place]   [phone] 3:49 PM

## 2017-09-04 NOTE — Assessment & Plan Note (Signed)
Patient history was more consistent with vascular dementia. According to Moca score of 12 out of 30, she was at moderate cognitive impairment. She has microinfarcts on her recent MRI.  She is already on aspirin and Lipitor. Risk factor modification to prevent further CVA should be the goal to prevent further decline in her cognitive function. We will also check vitamin D level to rule out any reversible cause of dementia.

## 2017-09-04 NOTE — Therapy (Signed)
[facility] [address] [place], [place], [address] Phone: [phone]   Fax:  [phone]  Physical Therapy Evaluation  Patient Details  Name: Mary Walters MRN: [id number] Date of Birth: 09/22/1928 Referring Provider: Acquanetta Belling MD   Encounter Date: 09/04/2017  PT End of Session - 09/04/17 1725    Visit Number  1    Number of Visits  1    Date for PT Re-Evaluation  09/04/17    Authorization Type  MCR    PT Start Time  1455    PT Stop Time  1530    PT Time Calculation (min)  35 min    Activity Tolerance  Patient tolerated treatment well;Treatment limited secondary to medical complications (Comment)       Past Medical History:  Diagnosis Date  . Diabetes mellitus without complication (HCC)   . HLD (hyperlipidemia) 06/02/2017  . Hypertension   . 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 No pertinent surgical history.  There were no vitals filed for this visit.   Subjective Assessment - 09/04/17 1557    Subjective  I am doing great.  I dont have any pain at all, I dont have a bad problem with falls.  but Dtr  in law disagrees.  Dtr in law says pt was seeing a PT for HHPT but stoppped coming.      Patient is accompained by:  Family member   dtr in law   Pertinent History  Vascular event and old infarct on     Currently in Pain?  No/denies         [facility] PT Assessment - 09/04/17 1355      Assessment   Medical Diagnosis  unsteadiness on feet    Referring Provider  McDiarmid, Tawanna Cooler MD    Prior Therapy  yes, HHPT and time at rehab in [place] [facility] in may 2019      Precautions   Precautions  Fall      Home Environment   Living Environment  Private residence    Living Arrangements  Alone    Type of Home  Apartment    Home Layout  One level    Additional Comments  use elevator      Prior Function   Level of Independence  Independent with household mobility with device      Cognition   Overall Cognitive Status  Impaired/Different from baseline   see Geriatric Clinic assessment     ROM / Strength   AROM / PROM / Strength  AROM;Strength      AROM   Overall AROM   Deficits    Overall AROM Comments  grossly WFL      Strength   Overall Strength  Deficits    Right Hip Flexion  4-/5    Right Hip Extension  3-/5    Right Hip ABduction  3-/5    Left Hip Flexion  4-/5    Left Hip Extension  3-/5    Left Hip ABduction  3-/5    Right Knee Flexion  4-/5    Right Knee Extension  4-/5    Left Knee Flexion  4-/5    Left Knee Extension  4-/5      Ambulation/Gait   Assistive device  Rolling walker    Gait Pattern  Decreased hip/knee flexion - right;Decreased hip/knee flexion - left    Ambulation Surface  Level    Gait velocity  2.54 ft/sec   .748  m/s      [facility] Pre[facility] Assessment - 09/04/17 0001    5 Meter Walk Test- trial 1  13.4 sec    5 Meter Walk Test- trial 2  12 sec.     5 Meter Walk Test- trial 3  11.3 sec.    5 meter walk test average  12.23 sec    4 Stage Balance Test tolerated for:   4 sec.   sec   4 Stage Balance Test Position  3    comment  5/12 SPPB  high frailty    Sit To Stand Test- trial 1  21.4 sec.    Comment  > 15 sec risk for recurrent falls              Objective measurements completed on examination: See above findings.              PT Education - 09/04/17 1611    Education Details  PT evaluated for 1 x  visit in Geriatric Clinic and educated on sit to stand safety and givne falls prevention handout    Person(s) Educated  Patient;Child(ren)   dtr in law   Methods  Explanation;Demonstration    Comprehension  Verbalized understanding;Returned demonstration       PT Short Term Goals - 09/04/17 1616      PT SHORT TERM GOAL #1   Title  Pt given handout for falls prevention    Period  Days    Status  Achieved    Target Date  09/04/17               Plan - 09/04/17 1728    Clinical Impression Statement   Clinical Impresssion Statement ;Pt is a 84yo female presenting to the Geriatric Clinic for Falls/Balance and Strength evaluation. Pt presents with no complaint of pain. Pt recently had a vascular event/old CVA's - vascular dementia determined by Dr Excell Seltzerodd Mc Diarmid. Marland Kitchen. Pt presents with decreased  Strength in LE's with difficulty getting out of chair and requiring UE support for safety. , decreased  Balance with history of 2 falls.  and is at . High fall risk 4 stage balance test.  2.5646ft/sec with limited community  walking speed using a walker Pt performed 5xSTS in 21.41 seconds which is indicative of high fall risk.  Hand grip strength is L 22.6 lb and R 22.67 lb indicative of overall decreased strength and weakness. Pt also had an incontinence issue during the gait/balance testing and was unable to make it to the bathroom.  Pt would benefit from HHPT for safety evaluation, balance, strength and falls prevention in the home    Clinical Presentation  Evolving    Clinical Decision Making  Moderate    Rehab Potential  Good    PT Frequency  One time visit    PT Treatment/Interventions  ADLs/Self Care Home Management;Patient/family education;Balance training;Other (comment)   Falls edcuation   PT Next Visit Plan  Pt deficits to be assessed by HHPT    Consulted and Agree with Plan of Care  Patient;Family member/caregiver       Abnormal gait, Difficulty walking, Decreased strength, Decreased balance, Decreased cognition, Decreased mobility, Decreased safety awareness  Visit Diagnosis: Unsteadiness on feet  Muscle weakness (generalized)     Problem List Patient Active Problem List   Diagnosis Date Noted  . Decreased sensation of foot 09/04/2017  . Onychomycosis of toenail 09/04/2017  . Memory difficulties 08/06/2017  . HLD (hyperlipidemia) 06/02/2017  . CKD (chronic kidney  disease), stage III (HCC) 06/02/2017  . 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 Type 2 diabetes mellitus with stage 3 chronic  kidney disease, without long-term current use of insulin (HCC) 05/14/2017  . Hypertension complicating diabetes (HCC) 05/14/2017   Garen Lah, PT Certified Exercise Expert for the Aging Adult  09/04/17 5:30 PM Phone: [phone] Fax: [phone]  [facility] [address] [place], [place], [address] Phone: [phone]   Fax:  [phone]  Name: EDELL MESENBRINK MRN: [id number] Date of Birth: September 26, 1928   Patient was evaluated by Physical Therapist today during multi-disciplinary Geriatric Clinic. Screening questions and medication/history review were completed by the medical team. Please see MD encounter for full assessment.

## 2017-09-04 NOTE — Progress Notes (Signed)
[facility]:   Patient is accompanied by: daughter in law. Primary caregiver: daughter in law Patient's lives alone. Patient information was obtained from caregiver and patient herself.. History/Exam limitations: dementia. Primary Care Provider: Garth Bigness, MD Referring provider: Garth Bigness, MD Reason for referral: Memory concerns. Previous Report Reviewed: office notes    HPI by problems:  Mary Walters 84 y.o lady with PMHx significant for HTN and well controlled DM presented to Geriatric clinic with her daughter in law for assessment of her worsening memory problems.  Patient does not feel that she is having much memory issues but family has noticed declining memory and behavior change sine her last hospitalization in 04/19 due to possible TIA.  According to daughter in law pt. is not much eager to go out as she use to before. She cannot mange her finance and medications.Pt. was recently started on antihypertensives after TIA, A1c was 6.5 and she has diet controlled DM. Pt.stopped taking her medications herself for 5 years and managing her hypertension and DM with diet. According to pt. She still enjoys reading,but mostly do not want to get dressed to go out.Recently she is refusing to go for her doctor's appointment also.  She lives alone and able to manage her ADLs but not her IADLs.  During conversation patient and her daughter-in-law continue to argue on different issues.  Cognitive impairment concern What problems with thinking are there?  slow information processing, repetition.   When were the changes first noticed?  several month ago, mostly after her hospitalization in April 2019 due to TIA.  Did this change occur abruptly or gradually?  Mostly appropriately after possible TIA.  How have the changes progressed since then?  gradually worsening in some aspects but mostly remains stable.  Has there been any tremors or abnormal  movements?  no  Have they had in hallucinations or delusions:  yes  Have they appeared more anxious or sad lately?  no  Do they still have interests or activities they enjor doing?  yes  How has their appetite been lately?  show no change  How has their sleep been lately?  generally restful sleep, according to daughter-in-law patient is a night owl and sleeps in the morning hours.  Problem behaviors:  agitation   Compared to 5 to 10 years ago, how is the patient at:  Problems with Judgment, e.g., problem making decisions, bad financial decisions, problems with thinking?  yes; are worsening   Less interested in hobbies or previously enjoyed activities?  show no change   Problem remembering things about family and friends e.g. names,  occupations, birthdays, addresses?  Present, according to patient she was never into birthdays. are worsening  Problem remembering conversations or news events a few days later?  yes; are worsening  Problem remembering what day and month it is? yes; no change since only noted in April 2019  Problem with losing things?  yes; are worsening  Problem learning to use a new gadget or machine around the house, e.g., cell phones, computer, microwave, remote control?  yes; show no change  Problem with handling money for shopping?  yes; are worsening  Problem handling financial matters, e.g. their pension, checking, credit cards, dealing with the bank?  yes, son handles it. are worsening  Problem with getting lost in familiar places?  show no change  Problem with asking the same questions repeatedly or telling the same story repeatedly to the same person(s)?  yes;    Has  there been a change in their usual personality?  yes     Behavioral and Psychological Symptoms of Dementia (relevant or irrelevant):  If relevant, address whether these symptoms are present:  1. Anxiety:                     Does the patient become upset when  separated from the caregiver? no                    Does he/she have any other signs of nervousness such as shortness of breath, sighing, being unable to relax, or feeling excessively tense?                                        no 2.   Irritability/Agitation/Aggression:                     Does the patient become impatient, cranky, have difficulty waiting?  no                    Does the patient resist care like bathing, dressing, eating/feeding? no                    Does the patient strike others? no                    3.   Depression/Dysphoria:                      Does the patient appear sad, tearful, withdrawn, disinterested? no                     Has patient lost interest in eating?  no.  Is the patient losing or gaining weight?  no 4.   Delusions:                      Does the patient believe that others are stealing from them or planning to hurt them in some way? no 5.   Hallucinations:                     Does the patient hearing voices or does he/she talk to people who are not there? yes                    Does the patient seeing things that others do not see? yes 6.   Motor disturbances:                     Does the patient do the same thing over and over, like taking clothes out of drawers, pacing, or yelling repeatedly  no                    Has patient gotten lost? No 7.   Disinhibition:                     Does the patient seem to act impulsively, for example, talking to strangers as if he/she knows them? no                    Is the patient verbally abusive to others? no 8.   Nighttime issues:  Is the patient frequently awakening at night or getting up too early? no                    Does the patient wander about home at night? no                    Has the patient wandered outside? no   Geriatric Depression Scale:   / 15    Outpatient Encounter Medications as of 09/04/2017  Medication Sig  . aspirin 325 MG tablet Take 1 tablet (325 mg total) by  mouth daily.  Marland Kitchen atorvastatin (LIPITOR) 40 MG tablet Take 1 tablet (40 mg total) by mouth daily at 6 PM.  . candesartan (ATACAND) 4 MG tablet TAKE 1 TABLET BY MOUTH EVERY DAY  . glucose blood test strip Use as instructed  . Lancet Devices (ONE TOUCH DELICA LANCING DEV) MISC 1 Device by Does not apply route daily as needed.  Letta Pate DELICA LANCETS 33G MISC 1 Units by Does not apply route daily as needed.  . [DISCONTINUED] ondansetron (ZOFRAN-ODT) 4 MG disintegrating tablet Take 1 tablet (4 mg total) by mouth every 8 (eight) hours as needed for nausea or vomiting.   No facility-administered encounter medications on file as of 09/04/2017.     History Patient Active Problem List   Diagnosis Date Noted  . Decreased sensation of foot 09/04/2017  . Onychomycosis of toenail 09/04/2017  . Memory difficulties 08/06/2017  . HLD (hyperlipidemia) 06/02/2017  . CKD (chronic kidney disease), stage III (HCC) 06/02/2017  . 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 Type 2 diabetes mellitus with stage 3 chronic kidney disease, without long-term current use of insulin (HCC) 05/14/2017  . Hypertension complicating diabetes (HCC) 05/14/2017   Past Medical History:  Diagnosis Date  . Diabetes mellitus without complication (HCC)   . HLD (hyperlipidemia) 06/02/2017  . Hypertension   . 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 Family History  Problem Relation Age of Onset  . Hypertension Mother    Social History   Socioeconomic History  . Marital status: Widowed    Spouse name: Not on file  . Number of children: Not on file  . Years of education: Not on file  . Highest education level: Not on file  Occupational History  . Occupation: radiology tech    Comment: retired  Engineer, production  . Financial resource strain: Not on file  . Food insecurity:    Worry: Not on file    Inability: Not on file  . Transportation needs:    Medical: Not on file    Non-medical: Not on file   Tobacco Use  . Smoking status: Never Smoker  . Smokeless tobacco: Never Used  Substance and Sexual Activity  . Alcohol use: Yes    Alcohol/week: 2.0 standard drinks    Types: 1 Glasses of wine, 1 Shots of liquor per week    Comment: Patient occasionally drinks wine.  She talks about rum but her grandchildren say she does not buy any  . Drug use: Never  . Sexual activity: Not Currently  Lifestyle  . Physical activity:    Days per week: Not on file    Minutes per session: Not on file  . Stress: Not on file  Relationships  . Social connections:    Talks on phone: Not on file    Gets together: More than three times a week    Attends religious service: Not  on file    Active member of club or organization: Not on file    Attends meetings of clubs or organizations: Not on file    Relationship status: Not on file  Other Topics Concern  . 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      Cardiovascular Risk Factors: Hypertension, DM  Educational History: 12 years formal education Personal History of Seizures: No -  Personal History of Stroke: No -  Personal History of Head Trauma: No -  Personal History of Psychiatric Disorders: No -  Family History of Dementia:     Basic Activities of Daily Living  Dressing: Self-care Eating: Self-care Ambulation: Self-care Toileting: Self-care Bathing: Self-care  Instrumental Activities of Daily Living Shopping: Partial assistance House/Yard Work: Partial assistance Administration of medications: Partial assistance Finances: Total assistance Telephone: Partial assistance Transportation: Total assistance   Caregivers in home: None  Caregiver Stress Self-Assessment (Zarit Score):   out of 80 Scoring: 0-20 = Little/No stress       21-40 = Mild/Moderate stress       41-60 = Moderate/Severe stress      61-80 = Severe  stress   Formal Home Health Assistance  Physical Therapy: no  Occupational Therapy: no             Home Aid / Personal Care Service: no             Homemaker services: no  FALLS in last five office visits:  Fall Risk  09/04/2017 08/06/2017 06/02/2017  Falls in the past year? No No Yes  Number falls in past yr: - - 1    Health Maintenance reviewed: Immunization History  Administered Date(s) Administered  . Pneumococcal Conjugate-13 06/02/2017  . Tdap 06/02/2017   Health Maintenance Topics with due status: Overdue     Topic Date Due   OPHTHALMOLOGY EXAM 04/12/1938   DEXA SCAN 04/13/1995   Health Maintenance Topics with due status: Due On     Topic Date Due   INFLUENZA VACCINE 08/14/2017    Diet: Diabetic Nutritional supplements: None  Geriatric Syndromes: Constipation  Laxative use: Incontinence .Yes Nocturia: no Dizziness no   Syncope no  Balance impairment:yes    Skin problems no   Visual Impairment yes   Hearing impairment no Dentures problems:  Dry mouth:   Eating impairment no  Impaired Memory or Cognition yes   Behavioral problems yes   Sleep problems no   Weight loss no Drug Misadventure: no   Joint pain: no Joint stiffness: no Osteoporosis:  Pressure Ulcers: no Immobility: no Ankle edema: yes History of UTIs:   ROS Denies fevers/chills; denies changes in appetite; denies changes in weight;  Denies changes in vision / hearing / smell / taste; Denies runny nose / ear pain or discharge / sore throat / sinus congestion / cough/w phlegm; Denies chest congestion / wheezing;  Denies chest pain; denies heart beating slower/thumps inside chest; denies racing heart/flutter; Denies dysuria; denies hematuria;  Denies constipation; denies melena/hematochezia; denies diarrhea;  Denies abdominal discomfort/gaseous bloating; denies N/V; denies heart burn;  Denies recent falls/unsteady gait;  Denies unilateral weakness / clumsiness / tingling / numbness; denies  tremors;  Denies sadness / anxiety / suicidal tendencies  Vital Signs Weight: 146 lb (66.2 kg) Body mass index is 28.51 kg/m. CrCl cannot be calculated (Patient's most recent lab result is older than the  maximum 21 days allowed.). Body surface area is 1.67 meters squared. Vitals:   09/04/17 1341  BP: (!) 144/90  Pulse: 91  Temp: 98.5 F (36.9 C)  TempSrc: Oral  SpO2: 96%  Weight: 146 lb (66.2 kg)  Height: 5' (1.524 m)   Wt Readings from Last 3 Encounters:  09/04/17 146 lb (66.2 kg)  08/06/17 141 lb (64 kg)  06/18/17 139 lb 6.4 oz (63.2 kg)    Hearing Screening   Method: Audiometry   125Hz  250Hz  500Hz  1000Hz  2000Hz  3000Hz  4000Hz  6000Hz  8000Hz   Right ear:   40 40 40  40    Left ear:   40 40 40  40    Comments: Patient was able to identify all the sounds but not with the directions that were initially given to her.     Visual Acuity Screening   Right eye Left eye Both eyes  Without correction: 0 0 0  With correction:     Comments: Patient does have reading glasses but doesn't wear them.    Physical Examination:  VS reviewed GEN: Alert, Cooperative, Groomed, NAD HEENT: PERRL; EAC bilaterally not occluded, TM's translucent with normal LM, (+) LR;                No cervical LAN, No thyromegaly, No palpable masses COR: RRR, No M/G/R, No JVD, Normal PMI size and location LUNGS: BCTA, No Acc mm use, speaking in full sentences ABDOMEN: (+)BS, soft, NT, ND, No HSM, No palpable masses EXT: Trace edema on right Le, 1+ on Left LE. Palpable bilateral pedal pulses.  SKIN: No lesion nor rashes of face/trunk/extremities Neuro: Oriented to person, place, ; Strength: 5/5 Bil. UE and LE symmetric; Sensation: Intact grossly to touch all four extremities; Cerebellar: Finger-to-Nose intact, Rhomberg negative; Muscle Tone normal; Tremor not present;  Psych: Normal affect/thought/speech/language  Mini-Mental State Examination or Montreal Cognitive Assessment:  Patient did  require  additional cues or prompts to complete tasks. Patient was cooperative and attentive to testing tasks Patient did  appear motivated to perform well  No flowsheet data found.      No flowsheet data found.    Labs No components found for: [facility]  Lab Results  Component Value Date   VITAMINB12 396 06/18/2017    No results found for: FOLATE  Lab Results  Component Value Date   TSH 1.590 06/18/2017    No results found for: RPR  No results found for: HIV    Chemistry      Component Value Date/Time   NA 140 06/18/2017 1116   K 4.1 06/18/2017 1116   CL 102 06/18/2017 1116   CO2 23 06/18/2017 1116   BUN 15 06/18/2017 1116   CREATININE 0.79 06/18/2017 1116      Component Value Date/Time   CALCIUM 9.5 06/18/2017 1116       Lab Results  Component Value Date   HGBA1C 6.4 08/06/2017      Hearing Screening   Method: Audiometry   125Hz  250Hz  500Hz  1000Hz  2000Hz  3000Hz  4000Hz  6000Hz  8000Hz   Right ear:   40 40 40  40    Left ear:   40 40 40  40    Comments: Patient was able to identify all the sounds but not with the directions that were initially given to her.     Visual Acuity Screening   Right eye Left eye Both eyes  Without correction: 0 0 0  With correction:     Comments: Patient does have reading  glasses but doesn't wear them.   Lab Results  Component Value Date   WBC 5.2 06/18/2017   HGB 11.4 06/18/2017   HCT 37.2 06/18/2017   MCV 82 06/18/2017   PLT 316 06/18/2017    No results found for this or any previous visit (from the past 24 hour(s)).  Imaging Head CT: 05/13/17  Brain MRI: 05/14/17   Assessment and Plan: Problem List Items Addressed This Visit      Other   Memory difficulties - Primary   Relevant Orders   Vitamin D (25 hydroxy)    Other Visit Diagnoses    Immunization due       Relevant Orders   Varicella-zoster vaccine IM (Shingrix)     No problem-specific Assessment & Plan notes found for this encounter.   PHYSICAL  THERAPY   A physical therapy evaluation was completed during today's geriatric interdisciplinary assessment clinic. The patient will benefit from further skilled PT services at this time and will be scheduled for treatment at our [facility] at home.  Please see the Physical Therapy evaluation note in the chart for additional information.   Personal Strengths Communication skills  Support System Strengths Family N  Advanced Directives: Code Status: FULL    60 minutes face to face where spent in total with counseling / coordination of care took more than 50% of the total time. Counseling involved discussion differential diagnosis, testing, prognosis, adherence, risk reduction, benefits of treatment, instructions, compliance.  Care was coordinated with our Social worker, Physical therapist and pharmacist who also saw patient during his visit.  I discussed his case with Child psychotherapist, Physical therapist and pharmacist  .

## 2017-09-04 NOTE — Progress Notes (Addendum)
Patient was seen today in a co-visit with the Geriatrics Assessment Clinic Team. Medications were reviewed with patient's daughter-in-law. Patient manages her medications at home with the help of family and she uses a pill box to stay on track. No medication-related questions or concerns today. Recommended and provided information on Shingrix vaccine, and could consider checking vitamin D level for patient.

## 2017-09-04 NOTE — Patient Instructions (Addendum)
Please continue taking your medications as prescribed follow up with your pharmacy on the Shingrix vaccine. We will follow up with you on your vitamin D level results.  Mary Walters you are evaluated today for some memory issues which shows moderate dementia, most likely due to your small strokes.  It is very important to keep your high blood pressure and diabetes under good control.  You should continue taking your aspirin and Lipitor to prevent any more strokes which can result and decline in your memory.  We really think that you will get benefit with some home health services which include physical therapy to give you some strength to prevent falls. We are also checking vitamin D level, if low we will give you some supplement.  We really appreciate your time and effort to complete that long testing which help as determining the degree of your dementia.   Vascular Dementia Dementia is a condition in which a person has problems with thinking, memory, and behavior that are severe enough to interfere with daily life. Vascular dementia is a type of dementia. It results from brain damage that is caused by the brain not getting enough blood. Vascular dementia usually begins between 5 and 91 years of age. What are the causes? Vascular dementia is caused by conditions that lessen blood flow to the brain. Common causes include:  Multiple small strokes. These may happen without symptoms (silent stroke).  Major stroke.  Damage to small blood vessels in the brain (cerebral small vessel disease).  What increases the risk?  Advancing age.  Having had a stroke.  Having high blood pressure (hypertension) or high cholesterol.  Having a disease that affects the heart or blood vessels.  Smoking.  Having diabetes.  Being female.  Being obese.  Not being active.  Having depression. What are the signs or symptoms? Symptoms can vary a lot from one person to another. Symptoms may be mild or severe  depending on the amount of damage and which parts of the brain have been affected. Symptoms may begin suddenly or may develop gradually. Symptoms may remain stable, or they may get worse over time. Symptoms of vascular dementia may be similar to those of Alzheimer disease. The two conditions can occur together (mixed dementia). Symptoms of vascular dementia may include: Mental  Confusion.  Memory problems.  Poor attention and concentration.  Trouble understanding speech.  Depression.  Personality changes.  Trouble recognizing familiar people.  Agitation or aggression.  Paranoia.  Delusions or hallucinations. Physical  Weakness.  Poor balance.  Loss of bladder or bowel control (incontinence).  Unsteady walking (gait).  Speaking problems. Behavioral  Getting lost in familiar places.  Problems with planning and judgment.  Trouble following instructions.  Social problems.  Emotional outbursts.  Trouble with daily activities and self-care.  Problems handling money. How is this diagnosed? There is not a specific test to diagnose vascular dementia. The health care provider will consider the person's medical history and symptoms or changes that are reported by friends and family. The health care provider will do a physical exam and may order lab tests or other tests that check brain and nervous system function. Tests that may be done include:  Blood tests.  Brain imaging tests.  Tests of movement, speech, and other daily activities (neurological exam).  Tests of memory, thinking, and problem-solving (neuropsychological or neurocognitive testing).  Diagnosis may involve several specialists. These may include a health care provider who specializes in the brain and nervous system (neurologist), a  provider who specializes in disorders of the mind (psychiatrist), and a provider who focuses on speech and language changes (Doctor, general practicespeech pathologist). How is this  treated? There is no cure for vascular dementia. Brain damage that has already occurred cannot be reversed. Treatment depends on:  How severe the condition is.  Which parts of the brain have been affected.  The person's overall health.  Treatment measures aim to:  Treat the underlying cause of vascular dementia and manage risk factors. This may include: ? Controlling blood pressure. ? Lowering cholesterol. ? Treating diabetes. ? Quitting smoking. ? Losing weight.  Manage symptoms.  Prevent further brain damage.  Improve the person's health and quality of life.  Treatment for dementia may involve a team of health care providers, including:  A neurologist.  A psychiatrist.  An occupational therapist.  A speech pathologist.  A cardiologist.  An exercise physiologist or physical therapist.  Follow these instructions at home: Home care for a person with vascular dementia depends on what caused the condition and how severe the symptoms are. General guidelines for care at home include:  Following the health care provider's instructions for treating the condition that caused the dementia.  Using medicines only as told by the person's health care provider.  Creating a safe living space.  Learning ways to help the person remember people, appointments, and daily activities.  Finding a support group to help caregivers and family to cope with the effects of dementia.  Helping family and friends learn about ways to communicate with a person who has dementia.  Making sure the person keeps all follow-up visits and goes to all rehabilitation appointments as told by the health care team. This is important.  Contact a health care provider if:  A fever develops.  New behavioral problems develop.  Problems with swallowing develop.  Confusion gets worse.  Sleepiness gets worse. Get help right away if:  Loss of consciousness occurs.  There is a sudden loss of speech,  balance, or thinking ability.  New numbness or paralysis occurs.  Sudden, severe headache occurs.  Vision is lost or suddenly gets worse in one or both eyes. This information is not intended to replace advice given to you by your health care provider. Make sure you discuss any questions you have with your health care provider. Document Released: 12/21/2001 Document Revised: 06/08/2015 Document Reviewed: 04/13/2014 Elsevier Interactive Patient Education  2018 [employer]

## 2017-09-05 ENCOUNTER — Encounter: Payer: Self-pay | Admitting: Family Medicine

## 2017-09-05 DIAGNOSIS — N3941 Urge incontinence: Secondary | ICD-10-CM

## 2017-09-05 DIAGNOSIS — F0150 Vascular dementia without behavioral disturbance: Secondary | ICD-10-CM | POA: Insufficient documentation

## 2017-09-05 HISTORY — DX: Vascular dementia, unspecified severity, without behavioral disturbance, psychotic disturbance, mood disturbance, and anxiety: F01.50

## 2017-09-05 HISTORY — DX: Urge incontinence: N39.41

## 2017-09-05 LAB — VITAMIN D 25 HYDROXY (VIT D DEFICIENCY, FRACTURES): Vit D, 25-Hydroxy: 13.6 ng/mL — ABNORMAL LOW (ref 30.0–100.0)

## 2017-09-05 MED ORDER — CHOLECALCIFEROL 50 MCG (2000 UT) PO CAPS: 2000.0000 [IU] | ORAL_CAPSULE | Freq: Every day | ORAL | 3 refills | Status: DC

## 2017-09-05 NOTE — Progress Notes (Signed)
Prescription sent for treatment of Vitamin D deficiency. Best dose not established in elderly, estimated increase in serum vitamin D by 1.0 ng/mL per 100 units of vitamin D supplementation. Avoiding very high doses of vitamin D (e.g. > 60,000-100,000 units per month) due to literature suggesting association with increased fall/fracture risk in elderly patients. Therefore, initiated 2000 units daily.  Vitamin D was reviewed with the patient, including name, instructions, indication, goals of therapy, potential side effects, importance of adherence, and safe use.  Patient verbalized understanding by repeating back information and was advised to contact me if medication-related questions arise.

## 2017-09-05 NOTE — Progress Notes (Signed)
I have interviewed and examined the patient with Dr Shanda BumpsS. Amin.  I have discussed the case and verified the key findings with Dr. Nelson ChimesAmin.   I agree with their assessments and plans as documented in their geriatric consult note for today.   A/P  1. 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 Evidence of progression of cognitive and functional impairments from her primary caretaker, her daughter-in-law, Phillips HaySylvia Gadison report  since hospitalization in April of this year FAST  stage 5 of Dementia with resultant impaired abilities instrumental activities of daily living finances, shopping, travel outside home and [address]. Ms Mary Walters is independent in all her basic activities of daily living  There are no clear behavioral and psychological symptoms of dementia other than a report of her talking with people who were not there at her SNF which could be attributed to the resolving phase of the delirium for which she was hospitalized.    - Counseled patient and family regarding the diagnosis of dementia and progressive nature of the neurodegenerative disorder.  The family would benefit from learning strategies to communicate with patients with dementia.  Provided packet of materials assembled for patients and families with more information about dementia and resources to assist with coping with impairments and behaviors.  Alzheimer Association as a particularly good resource for support of caregivers. A form allowing the Alzheimer's Association to contact the caregivers was signed and fax'd to the association.   Recommend social services consultation for community resources including the [facility] [place], adults day care programs, caregiver support.   Recommended caregivers address advanced planning for patient's financial estate, future legal questions of competency and desired medical care the patient desires currently for resuscitation and for future care, including health care power of attorney  agent, should patient become severely incapacitated. I believe Mrs Mary Walters currently has capacity to appreciate what is in her best interest in choosing someone in the family to be her durable POA, and in deciding who could exercise substituted judgment on her behalf.    Neurology consult was not recommended.   Patient and caregiver(s) questions were invited and answered as best as possible.  2. Urge Incontinence - Mrs Mary Walters had an episode of urge incontinence while standing up from chair during her office visit. This incontinence may be one of the reasons that Mrs Mary Walters is no longer leaving her apartment.  A visit just to address this issue and look for secondary causes. Ordinarily, referral to pelvic rehab would be first line therapy, but I believe Ms Froberg's cognitive impairment and low threshold for frustration limits the potential benefit of pelvic rehab.  Pharmacologic intervention, timed toileting, and adult diapers may be the best incontinence management options for her.   Ms Sammuel HinesDeborah Moore will stay in contact with the primary caretakers to follow up plans.   3. Vitamin D deficiency. - Mrs Mary Walters should be started on Vitamin D supplement of 2000 IU daily.  4. Vaccinations: Mrs Mary Walters could benefit from a Shingrix vaccination series.  An order for the vaccination series was sent to her pharmacy. 5. Unsteadiness on feet and falls - See PT evaluation for today - [facility] and home safety evaluation was made.

## 2017-09-12 ENCOUNTER — Ambulatory Visit: Payer: Medicare HMO | Admitting: Podiatry

## 2017-09-23 ENCOUNTER — Telehealth: Payer: Self-pay | Admitting: Licensed Clinical Social Worker

## 2017-09-23 NOTE — Progress Notes (Signed)
Type of Service: Clinical Social Work  LCSW F/U phone call to patient's son Mary Walters [phone]) and daughter-in-law to see if they had any questions ref the information provided during [facility].  1. Living Will and Medical POA,  2. Resources for Legal counsel, 3. Care giver Support Group   2. 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  LCSW reviewed with son the importance of assisting patient with Advance Directives and POA. He was appreciative of the F/U call.      Plan: Patient's son Mary Walters will review the information and contact LCSW if needed.  Sammuel Hines, LCSW Licensed Clinical Social Worker [facility]   [phone] 9:36 AM

## 2017-10-21 ENCOUNTER — Ambulatory Visit (INDEPENDENT_AMBULATORY_CARE_PROVIDER_SITE_OTHER): Payer: Self-pay | Admitting: Podiatry

## 2017-10-21 ENCOUNTER — Encounter: Payer: Self-pay | Admitting: Podiatry

## 2017-10-21 DIAGNOSIS — M79675 Pain in left toe(s): Secondary | ICD-10-CM

## 2017-10-21 DIAGNOSIS — B351 Tinea unguium: Secondary | ICD-10-CM

## 2017-10-21 DIAGNOSIS — M79674 Pain in right toe(s): Secondary | ICD-10-CM

## 2017-10-21 DIAGNOSIS — E1159 Type 2 diabetes mellitus with other circulatory complications: Secondary | ICD-10-CM

## 2017-10-21 NOTE — Progress Notes (Signed)
Complaint:  Visit Type: Patient returns to my office for continued preventative foot care services. Complaint: Patient states" my nails have grown long and thick and become painful to walk and wear shoes" Patient has been diagnosed with DM with neuropathy angiopathy.. The patient presents for preventative foot care services. No changes to ROS  Podiatric Exam: Vascular: dorsalis pedis and posterior tibial pulses are not  palpable bilateral. Capillary return is immediate. Temperature gradient is WNL. Skin turgor WNL  Sensorium: Absent  Semmes Weinstein monofilament test. Normal tactile sensation bilaterally. Nail Exam: Pt has thick disfigured discolored nails with subungual debris noted bilateral entire nail hallux through fifth toenails Ulcer Exam: There is no evidence of ulcer or pre-ulcerative changes or infection. Orthopedic Exam: Muscle tone and strength are WNL. No limitations in general ROM. No crepitus or effusions noted. Foot type and digits show no abnormalities. Bony prominences are unremarkable. Skin: No Porokeratosis. No infection or ulcers  Diagnosis:  Onychomycosis, , Pain in right toe, pain in left toes  Treatment & Plan Procedures and Treatment: Consent by patient was obtained for treatment procedures.   Debridement of mycotic and hypertrophic toenails, 1 through 5 bilateral and clearing of subungual debris. No ulceration, no infection noted.  Return Visit-Office Procedure: Patient instructed to return to the office for a follow up visit 3 months for continued evaluation and treatment.    Malala Trenkamp DPM 

## 2017-11-20 ENCOUNTER — Other Ambulatory Visit: Payer: Self-pay | Admitting: Family Medicine

## 2017-12-02 ENCOUNTER — Ambulatory Visit (INDEPENDENT_AMBULATORY_CARE_PROVIDER_SITE_OTHER): Payer: Medicare HMO | Admitting: Family Medicine

## 2017-12-02 VITALS — BP 140/90 | HR 71 | Temp 97.5°F | Wt 141.6 lb

## 2017-12-02 DIAGNOSIS — E1122 Type 2 diabetes mellitus with diabetic chronic kidney disease: Secondary | ICD-10-CM

## 2017-12-02 DIAGNOSIS — N183 Chronic kidney disease, stage 3 (moderate): Secondary | ICD-10-CM | POA: Diagnosis not present

## 2017-12-02 LAB — POCT GLYCOSYLATED HEMOGLOBIN (HGB A1C): HBA1C, POC (CONTROLLED DIABETIC RANGE): 6.8 % (ref 0.0–7.0)

## 2017-12-02 NOTE — Progress Notes (Signed)
Subjective:   Patient ID: Mary Walters    DOB: 1928/12/04, 84 y.o. female   MRN: [id number]  CC: s/p fall   HPI: Mary Walters is a 84 y.o. female who presents to clinic today for the following issue.  Recent fall Lives at home by herself.  Her family is able to check up on her everyday.  Had a fall yesterday where she rolled out of bed, she sleeps close to the edge. Her son found her on the floor, she denies LOC or hitting her head.  She endorses some left hip pain. Son used [place] on it.   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  That is why she is unstable sometimes when walking. No numbness or tingling into the leg.  Pain does not radiate into her leg.   Family interested in assisted living facility so that she can have more help at home.  Daughter in law prefers [facility].  She was previously living there after her stroke.  She has some degree of dementia and difficulty with her memory but is able to perform ADLs including toileting, changing clothes and bathing herself.   Daughter in law also express some concern that she does not use her walker at home.   T2DM Feels as though this is well controlled.  No new concerns or symptoms.    ROS: Fever, chills, nausea, vomiting.  No abdominal pain, shortness of breath or chest pain.  Social: Patient is a never smoker. Medications reviewed. Objective:   BP 140/90 (BP Location: Left Arm, Patient Position: Sitting, Cuff Size: Normal)   Pulse 71   Temp (!) 97.5 F (36.4 C) (Oral)   Wt 141 lb 9.6 oz (64.2 kg)   SpO2 98%   BMI 27.65 kg/m  Vitals and nursing note reviewed.  General: Pleasant 84 year old female, NAD HEENT: NCAT, EOMI, PERRL, MMM, oropharynx clear Neck: supple, no carotid bruit CV: RRR no MRG  Lungs: CTAB, normal effort  Abdomen: soft, NTND, +bs  Skin: warm, dry Extremities: warm and well perfused  Left hip: No obvious bony abnormalities,  no swelling or bruising noted.  Range of motion somewhat limited secondary to pain, no tenderness to palpation over greater trochanteric bursa, no tenderness to palpation noted over SI joint, lumbar spine or paraspinal musculature.   Motor strength 3/5, sensation intact. Able to weight bear and walk 10 steps.   Neuro: alert, oriented x3, no focal deficits  Assessment & Plan:   Type 2 diabetes mellitus with stage 3 chronic kidney disease, without long-term current use of insulin (HCC) Stable, last A1c July 2019 was 6.4. Repeat A1c today  Fall Patient encountered recent fall likely secondary to left leg weakness from right-sided CVA.  No history of head injury or LOC.  No red flags on exam.  Endorses some left hip pain following the fall which has since improved.  Does not endorse pain with exam and is able to bear weight per usual.  Unlikely fracture or acute injury, will hold off on x-rays at this time. Patient's daughter-in-law states she is concerned her mother-in-law does not use her walker.  Family also interested in placement in assisted living facility at this time - has previously lived at [facility].    I have completed paperwork for this at today's visit and returned it to the family.  Encouraged use of walker for stability.  Orders Placed This Encounter  Procedures  . HgB A1c   Freddrick March, MD [facility]

## 2017-12-02 NOTE — Patient Instructions (Signed)
It was nice meeting you today.  You were seen in clinic following a recent fall at home.  As we discussed, it is very important for you to make sure you use your walker for stability to prevent future falls.  We also discussed that you likely did not have any fracture or new injury of your left hip. It is reassuring that you do not have pain on exam and are able to bear weight when walking.   I have received your paperwork for possible long-term care placement and will fill this out.  Please call clinic if you have any questions in the meantime.  Be well, Freddrick MarchYashika Jamirah Zelaya MD

## 2017-12-08 DIAGNOSIS — W19XXXA Unspecified fall, initial encounter: Secondary | ICD-10-CM | POA: Insufficient documentation

## 2017-12-08 NOTE — Assessment & Plan Note (Signed)
Stable, last A1c July 2019 was 6.4. Repeat A1c today

## 2017-12-08 NOTE — Assessment & Plan Note (Signed)
Patient encountered recent fall likely secondary to left leg weakness from right-sided CVA.  No history of head injury or LOC.  No red flags on exam.  Endorses some left hip pain following the fall which has since improved.  Does not endorse pain with exam and is able to bear weight per usual.  Unlikely fracture or acute injury, will hold off on x-rays at this time. Patient's daughter-in-law states she is concerned her mother-in-law does not use her walker.  Family also interested in placement in assisted living facility at this time - has previously lived at [facility].    I have completed paperwork for this at today's visit and returned it to the family.  Encouraged use of walker for stability.

## 2018-01-20 ENCOUNTER — Ambulatory Visit (INDEPENDENT_AMBULATORY_CARE_PROVIDER_SITE_OTHER): Payer: Medicare HMO | Admitting: Podiatry

## 2018-01-20 ENCOUNTER — Encounter: Payer: Self-pay | Admitting: Podiatry

## 2018-01-20 DIAGNOSIS — M79674 Pain in right toe(s): Secondary | ICD-10-CM

## 2018-01-20 DIAGNOSIS — E1159 Type 2 diabetes mellitus with other circulatory complications: Secondary | ICD-10-CM

## 2018-01-20 DIAGNOSIS — B351 Tinea unguium: Secondary | ICD-10-CM

## 2018-01-20 DIAGNOSIS — M79675 Pain in left toe(s): Secondary | ICD-10-CM | POA: Diagnosis not present

## 2018-01-20 NOTE — Progress Notes (Signed)
Complaint:  Visit Type: Patient returns to my office for continued preventative foot care services. Complaint: Patient states" my nails have grown long and thick and become painful to walk and wear shoes" Patient has been diagnosed with DM with neuropathy angiopathy.. The patient presents for preventative foot care services. No changes to ROS  Podiatric Exam: Vascular: dorsalis pedis and posterior tibial pulses are not  palpable bilateral. Capillary return is immediate. Temperature gradient is WNL. Skin turgor WNL  Sensorium: Absent  Semmes Weinstein monofilament test. Normal tactile sensation bilaterally. Nail Exam: Pt has thick disfigured discolored nails with subungual debris noted bilateral entire nail hallux through fifth toenails Ulcer Exam: There is no evidence of ulcer or pre-ulcerative changes or infection. Orthopedic Exam: Muscle tone and strength are WNL. No limitations in general ROM. No crepitus or effusions noted. Foot type and digits show no abnormalities. Bony prominences are unremarkable. Skin: No Porokeratosis. No infection or ulcers  Diagnosis:  Onychomycosis, , Pain in right toe, pain in left toes  Treatment & Plan Procedures and Treatment: Consent by patient was obtained for treatment procedures.   Debridement of mycotic and hypertrophic toenails, 1 through 5 bilateral and clearing of subungual debris. No ulceration, no infection noted.  Return Visit-Office Procedure: Patient instructed to return to the office for a follow up visit 3 months for continued evaluation and treatment.    Lisbeth Puller DPM 

## 2018-02-17 ENCOUNTER — Other Ambulatory Visit: Payer: Self-pay | Admitting: Family Medicine

## 2018-02-17 DIAGNOSIS — E559 Vitamin D deficiency, unspecified: Secondary | ICD-10-CM

## 2018-04-10 ENCOUNTER — Emergency Department (HOSPITAL_COMMUNITY): Payer: Medicare HMO

## 2018-04-10 ENCOUNTER — Inpatient Hospital Stay (HOSPITAL_COMMUNITY)
Admission: EM | Admit: 2018-04-10 | Discharge: 2018-04-14 | DRG: 093 | Disposition: A | Discharge status: Skilled Nursing Facility | Payer: Medicare HMO | Attending: Family Medicine | Admitting: Family Medicine

## 2018-04-10 ENCOUNTER — Other Ambulatory Visit: Payer: Self-pay

## 2018-04-10 DIAGNOSIS — F0150 Vascular dementia without behavioral disturbance: Secondary | ICD-10-CM | POA: Diagnosis present

## 2018-04-10 DIAGNOSIS — R29898 Other symptoms and signs involving the musculoskeletal system: Secondary | ICD-10-CM | POA: Diagnosis not present

## 2018-04-10 DIAGNOSIS — Z833 Family history of diabetes mellitus: Secondary | ICD-10-CM | POA: Diagnosis not present

## 2018-04-10 DIAGNOSIS — N3000 Acute cystitis without hematuria: Secondary | ICD-10-CM

## 2018-04-10 DIAGNOSIS — R69 Illness, unspecified: Secondary | ICD-10-CM | POA: Diagnosis not present

## 2018-04-10 DIAGNOSIS — R531 Weakness: Secondary | ICD-10-CM | POA: Diagnosis not present

## 2018-04-10 DIAGNOSIS — F0390 Unspecified dementia without behavioral disturbance: Secondary | ICD-10-CM | POA: Diagnosis not present

## 2018-04-10 DIAGNOSIS — R410 Disorientation, unspecified: Secondary | ICD-10-CM | POA: Diagnosis not present

## 2018-04-10 DIAGNOSIS — I160 Hypertensive urgency: Secondary | ICD-10-CM | POA: Diagnosis present

## 2018-04-10 DIAGNOSIS — M545 Low back pain: Secondary | ICD-10-CM | POA: Diagnosis not present

## 2018-04-10 DIAGNOSIS — N390 Urinary tract infection, site not specified: Secondary | ICD-10-CM | POA: Diagnosis not present

## 2018-04-10 DIAGNOSIS — G8311 Monoplegia of lower limb affecting right dominant side: Secondary | ICD-10-CM | POA: Diagnosis present

## 2018-04-10 DIAGNOSIS — M25552 Pain in left hip: Secondary | ICD-10-CM | POA: Diagnosis not present

## 2018-04-10 DIAGNOSIS — R41841 Cognitive communication deficit: Secondary | ICD-10-CM | POA: Diagnosis not present

## 2018-04-10 DIAGNOSIS — R262 Difficulty in walking, not elsewhere classified: Secondary | ICD-10-CM

## 2018-04-10 DIAGNOSIS — E860 Dehydration: Secondary | ICD-10-CM | POA: Diagnosis not present

## 2018-04-10 DIAGNOSIS — Z7401 Bed confinement status: Secondary | ICD-10-CM | POA: Diagnosis not present

## 2018-04-10 DIAGNOSIS — I10 Essential (primary) hypertension: Secondary | ICD-10-CM | POA: Diagnosis not present

## 2018-04-10 DIAGNOSIS — I129 Hypertensive chronic kidney disease with stage 1 through stage 4 chronic kidney disease, or unspecified chronic kidney disease: Secondary | ICD-10-CM | POA: Diagnosis present

## 2018-04-10 DIAGNOSIS — M2550 Pain in unspecified joint: Secondary | ICD-10-CM | POA: Diagnosis not present

## 2018-04-10 DIAGNOSIS — Z9181 History of falling: Secondary | ICD-10-CM | POA: Diagnosis not present

## 2018-04-10 DIAGNOSIS — E1122 Type 2 diabetes mellitus with diabetic chronic kidney disease: Secondary | ICD-10-CM | POA: Diagnosis not present

## 2018-04-10 DIAGNOSIS — E785 Hyperlipidemia, unspecified: Secondary | ICD-10-CM | POA: Diagnosis not present

## 2018-04-10 DIAGNOSIS — M25551 Pain in right hip: Secondary | ICD-10-CM | POA: Diagnosis not present

## 2018-04-10 DIAGNOSIS — R402411 Glasgow coma scale score 13-15, in the field [EMT or ambulance]: Secondary | ICD-10-CM | POA: Diagnosis not present

## 2018-04-10 DIAGNOSIS — D72829 Elevated white blood cell count, unspecified: Secondary | ICD-10-CM

## 2018-04-10 DIAGNOSIS — Z8673 Personal history of transient ischemic attack (TIA), and cerebral infarction without residual deficits: Secondary | ICD-10-CM | POA: Diagnosis not present

## 2018-04-10 DIAGNOSIS — D72825 Bandemia: Secondary | ICD-10-CM | POA: Diagnosis not present

## 2018-04-10 DIAGNOSIS — E119 Type 2 diabetes mellitus without complications: Secondary | ICD-10-CM | POA: Diagnosis not present

## 2018-04-10 DIAGNOSIS — N183 Chronic kidney disease, stage 3 (moderate): Secondary | ICD-10-CM | POA: Diagnosis present

## 2018-04-10 DIAGNOSIS — G8314 Monoplegia of lower limb affecting left nondominant side: Secondary | ICD-10-CM | POA: Diagnosis not present

## 2018-04-10 DIAGNOSIS — R918 Other nonspecific abnormal finding of lung field: Secondary | ICD-10-CM | POA: Diagnosis not present

## 2018-04-10 DIAGNOSIS — M48061 Spinal stenosis, lumbar region without neurogenic claudication: Secondary | ICD-10-CM | POA: Diagnosis present

## 2018-04-10 DIAGNOSIS — M47812 Spondylosis without myelopathy or radiculopathy, cervical region: Secondary | ICD-10-CM | POA: Diagnosis not present

## 2018-04-10 DIAGNOSIS — D649 Anemia, unspecified: Secondary | ICD-10-CM | POA: Diagnosis not present

## 2018-04-10 DIAGNOSIS — E1165 Type 2 diabetes mellitus with hyperglycemia: Secondary | ICD-10-CM | POA: Diagnosis present

## 2018-04-10 DIAGNOSIS — R739 Hyperglycemia, unspecified: Secondary | ICD-10-CM | POA: Diagnosis not present

## 2018-04-10 DIAGNOSIS — N281 Cyst of kidney, acquired: Secondary | ICD-10-CM | POA: Diagnosis not present

## 2018-04-10 DIAGNOSIS — R52 Pain, unspecified: Secondary | ICD-10-CM

## 2018-04-10 DIAGNOSIS — M48062 Spinal stenosis, lumbar region with neurogenic claudication: Secondary | ICD-10-CM | POA: Diagnosis not present

## 2018-04-10 DIAGNOSIS — M6281 Muscle weakness (generalized): Secondary | ICD-10-CM | POA: Diagnosis not present

## 2018-04-10 DIAGNOSIS — R748 Abnormal levels of other serum enzymes: Secondary | ICD-10-CM | POA: Diagnosis not present

## 2018-04-10 DIAGNOSIS — E559 Vitamin D deficiency, unspecified: Secondary | ICD-10-CM | POA: Diagnosis not present

## 2018-04-10 LAB — COMPREHENSIVE METABOLIC PANEL
ALBUMIN: 3.1 g/dL — AB (ref 3.5–5.0)
ALT: 43 U/L (ref 0–44)
AST: 40 U/L (ref 15–41)
Alkaline Phosphatase: 542 U/L — ABNORMAL HIGH (ref 38–126)
Anion gap: 11 (ref 5–15)
BUN: 23 mg/dL (ref 8–23)
CO2: 25 mmol/L (ref 22–32)
Calcium: 9.9 mg/dL (ref 8.9–10.3)
Chloride: 100 mmol/L (ref 98–111)
Creatinine, Ser: 1.05 mg/dL — ABNORMAL HIGH (ref 0.44–1.00)
GFR calc Af Amer: 55 mL/min — ABNORMAL LOW (ref >60)
GFR calc non Af Amer: 48 mL/min — ABNORMAL LOW (ref >60)
GLUCOSE: 277 mg/dL — AB (ref 70–99)
Potassium: 4.3 mmol/L (ref 3.5–5.1)
Sodium: 136 mmol/L (ref 135–145)
Total Bilirubin: 1.5 mg/dL — ABNORMAL HIGH (ref 0.3–1.2)
Total Protein: 7.8 g/dL (ref 6.5–8.1)

## 2018-04-10 LAB — RAPID URINE DRUG SCREEN, HOSP PERFORMED
Amphetamines: NOT DETECTED
BARBITURATES: NOT DETECTED
Benzodiazepines: NOT DETECTED
Cocaine: NOT DETECTED
Opiates: NOT DETECTED
Tetrahydrocannabinol: NOT DETECTED

## 2018-04-10 LAB — URINALYSIS, ROUTINE W REFLEX MICROSCOPIC
Bilirubin Urine: NEGATIVE
Glucose, UA: >=500 mg/dL — AB
Ketones, ur: 5 mg/dL — AB
Leukocytes,Ua: NEGATIVE
Nitrite: NEGATIVE
Protein, ur: 100 mg/dL — AB
Specific Gravity, Urine: 1.024 (ref 1.005–1.030)
pH: 5 (ref 5.0–8.0)

## 2018-04-10 LAB — DIFFERENTIAL
Abs Immature Granulocytes: 0.12 10*3/uL — ABNORMAL HIGH (ref 0.00–0.07)
Basophils Absolute: 0 10*3/uL (ref 0.0–0.1)
Basophils Relative: 0 %
Eosinophils Absolute: 0 10*3/uL (ref 0.0–0.5)
Eosinophils Relative: 0 %
Immature Granulocytes: 1 %
Lymphocytes Relative: 5 %
Lymphs Abs: 0.8 10*3/uL (ref 0.7–4.0)
Monocytes Absolute: 1.8 10*3/uL — ABNORMAL HIGH (ref 0.1–1.0)
Monocytes Relative: 11 %
Neutro Abs: 13.8 10*3/uL — ABNORMAL HIGH (ref 1.7–7.7)
Neutrophils Relative %: 83 %

## 2018-04-10 LAB — CBC
HCT: 38 % (ref 36.0–46.0)
Hemoglobin: 11.9 g/dL — ABNORMAL LOW (ref 12.0–15.0)
MCH: 26 pg (ref 26.0–34.0)
MCHC: 31.3 g/dL (ref 30.0–36.0)
MCV: 83.2 fL (ref 80.0–100.0)
Platelets: 252 10*3/uL (ref 150–400)
RBC: 4.57 MIL/uL (ref 3.87–5.11)
RDW: 14.4 % (ref 11.5–15.5)
WBC: 16.5 10*3/uL — ABNORMAL HIGH (ref 4.0–10.5)
nRBC: 0 % (ref 0.0–0.2)

## 2018-04-10 LAB — HEMOGLOBIN A1C
Hgb A1c MFr Bld: 7.2 % — ABNORMAL HIGH (ref 4.8–5.6)
Mean Plasma Glucose: 159.94 mg/dL

## 2018-04-10 LAB — TROPONIN I: Troponin I: <0.03 ng/mL (ref <0.03)

## 2018-04-10 LAB — APTT: aPTT: 31 seconds (ref 24–36)

## 2018-04-10 LAB — PROTIME-INR
INR: 1.2 (ref 0.8–1.2)
PROTHROMBIN TIME: 15.1 s (ref 11.4–15.2)

## 2018-04-10 LAB — CK: Total CK: 135 U/L (ref 38–234)

## 2018-04-10 MED ORDER — SODIUM CHLORIDE 0.9% FLUSH: 3.0000 mL | INTRAVENOUS | Status: DC | PRN

## 2018-04-10 MED ORDER — ONDANSETRON HCL 4 MG/2ML IJ SOLN: 4.0000 mg | Freq: Four times a day (QID) | INTRAMUSCULAR | Status: DC | PRN

## 2018-04-10 MED ORDER — METOPROLOL TARTRATE 5 MG/5ML IV SOLN: 2.5000 mg | Freq: Four times a day (QID) | INTRAVENOUS | Status: DC | PRN

## 2018-04-10 MED ORDER — AMLODIPINE BESYLATE 5 MG PO TABS
5.0000 mg | ORAL_TABLET | Freq: Every day | ORAL | Status: DC
  Administered 2018-04-10 – 2018-04-14 (×5): 5 mg via ORAL
  Filled 2018-04-10 (×5): qty 1

## 2018-04-10 MED ORDER — HYDRALAZINE HCL 20 MG/ML IJ SOLN: 5.0000 mg | INTRAMUSCULAR | Status: DC | PRN

## 2018-04-10 MED ORDER — LABETALOL HCL 5 MG/ML IV SOLN
20.0000 mg | Freq: Once | INTRAVENOUS | Status: AC
  Administered 2018-04-10: 20 mg via INTRAVENOUS
  Filled 2018-04-10: qty 4

## 2018-04-10 MED ORDER — ONDANSETRON HCL 4 MG PO TABS: 4.0000 mg | ORAL_TABLET | Freq: Four times a day (QID) | ORAL | Status: DC | PRN

## 2018-04-10 MED ORDER — SODIUM CHLORIDE 0.9% FLUSH
3.0000 mL | Freq: Two times a day (BID) | INTRAVENOUS | Status: DC
  Administered 2018-04-11 – 2018-04-13 (×5): 3 mL via INTRAVENOUS

## 2018-04-10 MED ORDER — METOPROLOL TARTRATE 5 MG/5ML IV SOLN
5.0000 mg | Freq: Once | INTRAVENOUS | Status: AC
  Administered 2018-04-10: 5 mg via INTRAVENOUS
  Filled 2018-04-10: qty 5

## 2018-04-10 MED ORDER — METOPROLOL TARTRATE 25 MG PO TABS
25.0000 mg | ORAL_TABLET | Freq: Two times a day (BID) | ORAL | Status: DC
  Administered 2018-04-10 – 2018-04-11 (×2): 25 mg via ORAL
  Filled 2018-04-10 (×2): qty 1

## 2018-04-10 MED ORDER — SODIUM CHLORIDE 0.9 % IV SOLN: 250.0000 mL | INTRAVENOUS | Status: DC | PRN

## 2018-04-10 MED ORDER — SODIUM CHLORIDE 0.9 % IV SOLN
1.0000 g | INTRAVENOUS | Status: DC
  Administered 2018-04-11: 1 g via INTRAVENOUS
  Filled 2018-04-10 (×3): qty 10

## 2018-04-10 MED ORDER — SODIUM CHLORIDE 0.9 % IV SOLN
Freq: Once | INTRAVENOUS | Status: AC
  Administered 2018-04-10: 18:00:00 via INTRAVENOUS

## 2018-04-10 MED ORDER — SODIUM CHLORIDE 0.9 % IV SOLN
1.0000 g | Freq: Once | INTRAVENOUS | Status: AC
  Administered 2018-04-10: 1 g via INTRAVENOUS
  Filled 2018-04-10: qty 10

## 2018-04-10 MED ORDER — INSULIN ASPART 100 UNIT/ML ~~LOC~~ SOLN
0.0000 [IU] | Freq: Three times a day (TID) | SUBCUTANEOUS | Status: DC
  Administered 2018-04-11: 4 [IU] via SUBCUTANEOUS
  Administered 2018-04-11: 3 [IU] via SUBCUTANEOUS
  Administered 2018-04-11: 1 [IU] via SUBCUTANEOUS

## 2018-04-10 MED ORDER — INSULIN ASPART 100 UNIT/ML ~~LOC~~ SOLN
0.0000 [IU] | Freq: Every day | SUBCUTANEOUS | Status: DC
  Administered 2018-04-10: 2 [IU] via SUBCUTANEOUS

## 2018-04-10 MED ORDER — ENOXAPARIN SODIUM 40 MG/0.4ML ~~LOC~~ SOLN
40.0000 mg | SUBCUTANEOUS | Status: DC
  Administered 2018-04-10 – 2018-04-13 (×4): 40 mg via SUBCUTANEOUS
  Filled 2018-04-10 (×3): qty 0.4

## 2018-04-10 NOTE — ED Notes (Signed)
Attempted to ambulate Pt with walker, Pt refused to stand up. Pt stated that it hurts trying to stand up. Pt placed back in bed, assisted by Luisa Hart (NT) Notified Alexa (RN)

## 2018-04-10 NOTE — ED Provider Notes (Signed)
[facility] EMERGENCY DEPARTMENT Provider Note   CSN: [id number] Arrival date & time: 04/10/18  1447    History   Chief Complaint Chief Complaint  Patient presents with   Weakness    HPI Mary Walters is a 85 y.o. female.     Pt presents to the ED today with inability to walk.  Pt lives at home.  A granddaughter came over today and found her slumped over on the couch.  She was not able to walk.  LSN on 3/25.  Pt normally can walk and take care of herself.  Pt is unsure how long she's been on the couch.  She did not take her meds today.  She denies any pain.     No past medical history on file.   HTN DM  There are no active problems to display for this patient.    OB History   No obstetric history on file.      Home Medications    Prior to Admission medications   Not on File  Pt unsure of meds  Family History No family history on file.  Social History Social History   Tobacco Use   Smoking status: Not on file  Substance Use Topics   Alcohol use: Not on file   Drug use: Not on file  NO tob, no alcohol.  Lives alone.   Allergies   Patient has no allergy information on record.   Review of Systems Review of Systems  All other systems reviewed and are negative.    Physical Exam Updated Vital Signs BP (!) 184/94    Pulse 86    Temp 98.9 F (37.2 C) (Oral)    Resp (!) 25    SpO2 97%   Physical Exam Vitals signs and nursing note reviewed.  Constitutional:      Appearance: Normal appearance.  HENT:     Head: Normocephalic and atraumatic.     Comments: Poor dental hygeine    Right Ear: External ear normal.     Left Ear: External ear normal.     Nose: Nose normal.     Mouth/Throat:     Mouth: Mucous membranes are dry.  Eyes:     Extraocular Movements: Extraocular movements intact.     Pupils: Pupils are equal, round, and reactive to light.  Neck:     Musculoskeletal: Normal range of motion and neck supple.    Cardiovascular:     Rate and Rhythm: Normal rate and regular rhythm.     Pulses: Normal pulses.     Heart sounds: Normal heart sounds.  Pulmonary:     Effort: Pulmonary effort is normal.     Breath sounds: Normal breath sounds.  Abdominal:     General: Abdomen is flat.  Musculoskeletal: Normal range of motion.  Skin:    General: Skin is warm.     Capillary Refill: Capillary refill takes less than 2 seconds.  Neurological:     Mental Status: She is alert. Mental status is at baseline.     Comments: Right leg weakness.      ED Treatments / Results  Labs (all labs ordered are listed, but only abnormal results are displayed) Labs Reviewed  CBC - Abnormal; Notable for the following components:      Result Value   WBC 16.5 (*)    Hemoglobin 11.9 (*)    All other components within normal limits  DIFFERENTIAL - Abnormal; Notable for the following components:   Neutro  Abs 13.8 (*)    Monocytes Absolute 1.8 (*)    Abs Immature Granulocytes 0.12 (*)    All other components within normal limits  COMPREHENSIVE METABOLIC PANEL - Abnormal; Notable for the following components:   Glucose, Bld 277 (*)    Creatinine, Ser 1.05 (*)    Albumin 3.1 (*)    Alkaline Phosphatase 542 (*)    Total Bilirubin 1.5 (*)    GFR calc non Af Amer 48 (*)    GFR calc Af Amer 55 (*)    All other components within normal limits  URINALYSIS, ROUTINE W REFLEX MICROSCOPIC - Abnormal; Notable for the following components:   Color, Urine AMBER (*)    APPearance HAZY (*)    Glucose, UA >=500 (*)    Hgb urine dipstick SMALL (*)    Ketones, ur 5 (*)    Protein, ur 100 (*)    Bacteria, UA MANY (*)    All other components within normal limits  URINE CULTURE  PROTIME-INR  APTT  RAPID URINE DRUG SCREEN, HOSP PERFORMED  CK  TROPONIN I    EKG EKG Interpretation  Date/Time:  Friday April 10 2018 14:50:43 EDT Ventricular Rate:  99 PR Interval:    QRS Duration: 84 QT Interval:  402 QTC  Calculation: 516 R Axis:   -6 Text Interpretation:  Sinus tachycardia Atrial premature complex Left atrial enlargement Probable left ventricular hypertrophy Inferior infarct, old Prolonged QT interval No old tracing to compare Confirmed by Jacalyn Lefevre [phone]) on 04/10/2018 3:08:38 PM   Radiology Ct Head Wo Contrast  Result Date: 04/10/2018 CLINICAL DATA:  Difficulty walking. EXAM: CT HEAD WITHOUT CONTRAST TECHNIQUE: Contiguous axial images were obtained from the base of the skull through the vertex without intravenous contrast. COMPARISON:  None. FINDINGS: Brain: Mild diffuse cortical atrophy is noted. Moderate chronic ischemic white matter disease is noted. No mass effect or midline shift is noted. Ventricular size is within normal limits. There is no evidence of mass lesion, hemorrhage or acute infarction. Vascular: No hyperdense vessel or unexpected calcification. Skull: Normal. Negative for fracture or focal lesion. Sinuses/Orbits: No acute finding. Other: None. IMPRESSION: Mild diffuse cortical atrophy. Moderate chronic ischemic white matter disease. No acute intracranial abnormality seen. Electronically Signed   By: Lupita Raider, M.D.   On: 04/10/2018 15:50   [name]  Result Date: 04/10/2018 CLINICAL DATA:  Difficulty walking.  Leading to the left. EXAM: MRI HEAD WITHOUT CONTRAST TECHNIQUE: Multiplanar, multiecho pulse sequences of the brain and surrounding structures were obtained without intravenous contrast. COMPARISON:  Head CT 04/10/2018 FINDINGS: Some sequences are moderately to severely motion degraded. Axial T1 and coronal T2 sequences were not obtained. Brain: There is no evidence of acute infarct, intracranial hemorrhage, mass, midline shift, or extra-axial fluid collection. There is moderate cerebral atrophy. Confluent T2 hyperintensities in the cerebral white matter and patchy T2 hyperintensities in the pons are nonspecific but compatible with severe chronic small  vessel ischemic disease. Chronic small vessel ischemic changes are also present in the deep gray nuclei bilaterally, and there is a chronic lacunar infarct in the pons on the left. A partially empty sella is noted. Vascular: Major intracranial vascular flow voids are preserved. Skull and upper cervical spine: Unremarkable bone marrow signal. Sinuses/Orbits: Unremarkable orbits. Minimal left maxillary sinus mucosal thickening. Grossly clear mastoid air cells. Other: None. IMPRESSION: 1. Motion degraded, incomplete examination. 2. No acute infarct. 3. Severe chronic small vessel ischemic disease. Electronically Signed   By: Freida Busman  Mosetta Putt M.D.   On: 04/10/2018 17:50   Dg Chest Portable 1 View  Result Date: 04/10/2018 CLINICAL DATA:  left side weakness for unknown amount of time. No known heart or lung surgeries. Found today Leaning towards her left side and not being able to walk ; pt alert and oriented x 2 and according to family members this is her baseline,pt usually able to walk ; pt alert and following commands ; last time she was seen normal was on Wednesday ; hx of left sided weakness from previous stroke but according to family that resolved" EXAM: PORTABLE CHEST - 1 VIEW COMPARISON:  none FINDINGS: Low lung volumes. No definite infiltrate or overt edema. Heart size upper limits normal for technique. Blunting of left lateral costophrenic angle. No pneumothorax. Visualized bones unremarkable. IMPRESSION: Low volumes. No acute cardiopulmonary disease. Electronically Signed   By: Corlis Leak M.D.   On: 04/10/2018 15:40    Procedures Procedures (including critical care time)  Medications Ordered in ED Medications  cefTRIAXone (ROCEPHIN) 1 g in sodium chloride 0.9 % 100 mL IVPB (1 g Intravenous New Bag/Given 04/10/18 1945)  0.9 %  sodium chloride infusion ( Intravenous New Bag/Given 04/10/18 1809)  metoprolol tartrate (LOPRESSOR) injection 5 mg (5 mg Intravenous Given 04/10/18 1809)  labetalol  (NORMODYNE,TRANDATE) injection 20 mg (20 mg Intravenous Given 04/10/18 1921)     Initial Impression / Assessment and Plan / ED Course  I have reviewed the triage vital signs and the nursing notes.  Pertinent labs & imaging results that were available during my care of the patient were reviewed by me and considered in my medical decision making (see chart for details).       Pt does not have evidence of an acute stroke, but still can't walk.  The pt's BP is still very high even after lopressor and labetalol.  Pt may have a uti, so she is treated with levaquin.   CRITICAL CARE Performed by: Jacalyn Lefevre   Total critical care time: 30 minutes  Critical care time was exclusive of separately billable procedures and treating other patients.  Critical care was necessary to treat or prevent imminent or life-threatening deterioration.  Critical care was time spent personally by me on the following activities: development of treatment plan with patient and/or surrogate as well as nursing, discussions with consultants, evaluation of patient's response to treatment, examination of patient, obtaining history from patient or surrogate, ordering and performing treatments and interventions, ordering and review of laboratory studies, ordering and review of radiographic studies, pulse oximetry and re-evaluation of patient's condition.   We don't have any medical hx or med list on this pt.  I don't know how safe she is at home.  Due to all the above, pt d/w Dr. Sharyon Medicus (triad) for admission.  Final Clinical Impressions(s) / ED Diagnoses   Final diagnoses:  Dehydration  Ambulatory dysfunction  Hypertensive urgency  Acute cystitis without hematuria  Hyperglycemia    ED Discharge Orders    None       Jacalyn Lefevre, MD 04/10/18 1946

## 2018-04-10 NOTE — H&P (Signed)
[facility]                                                                                    Patient Demographics  Mary Walters, is a 85 y.o. female  CSN: [id number]  MRN: [id number]  DOB - 04-03-1928  Admit Date - 04/10/2018  Outpatient Primary MD for the patient is Patient, [name]   With History of -    in for   Chief Complaint  Patient presents with  . Weakness     HPI  Mary Walters  is a 85 y.o. female, who was brought by EMS today after her granddaughter saw her slumped on the couch basically unable to walk.  Patient lives at home and she usually ambulates with a walker and takes care of herself.  The patient said that she did not feel good in the last few days.  She denies any chest pains or shortness of breath.  Denies any fever cough, nausea, vomiting, diarrhea or headaches .  Patient denies any dizziness or loss of consciousness.  She just did not feel good all over with no specific complaints.  No past medical history was noted, medications were not known and I try to talk to family with no answer. In the emergency room work-up includes CT of the head and MRI did not show any acute infarct although patient had lower extremity weakness.  She had leukocytosis with no fever.  Work-up including chest x-ray was negative except for elevated blood sugar.  Patient does not know her medications. Her blood pressure was elevated and she was started on IV Lopressor and labetalol.  Patient will be started on Norvasc and Lopressor with PRN Lopressor and hydralazine.  Analysis showed many bacteria but no white blood cells and she will be started on Rocephin IV.  Urine culture to be ordered    Review of Systems    In addition to the HPI above,  No Fever-chills, No Headache, No changes with Vision or hearing, No problems swallowing food or Liquids, No Chest pain, Cough or Shortness of Breath, No Abdominal pain, No Nausea or Vommitting, Bowel movements are  regular, No Blood in stool or Urine, No dysuria, No new skin rashes or bruises, No new joints pains-aches,  No new weakness, tingling, numbness in any extremity, No recent weight gain or loss, No polyuria, polydypsia or polyphagia, No significant Mental Stressors.  A full 10 point Review of Systems was done, except as stated above, all other Review of Systems were negative.   Social History Social History   Tobacco Use  . Smoking status: Not on file  Substance Use Topics  . Alcohol use: Not on file     Family History Unable to remember except that diabetes runs in her family  Prior to Admission medications   Not on File    Allergies not on file  Physical Exam  Vitals  Blood pressure (!) 184/94, pulse 86, temperature 98.9 F (37.2 C), temperature source Oral, resp. rate (!) 25, SpO2 97 %.   1. General elderly female, very pleasant, no acute distress  2. Normal affect and insight, Not Suicidal or Homicidal,  Awake Alert, Oriented X 3.  3. No F.N deficits, grossly, patient moving all extremities.  4. Ears and Eyes appear Normal, Conjunctivae clear, PERRLA. Moist Oral Mucosa.  5. Supple Neck, No JVD, No cervical lymphadenopathy appriciated, No Carotid Bruits.  6. Symmetrical Chest wall movement, Good air movement bilaterally, CTAB.  7. RRR, No Gallops, Rubs or Murmurs, No Parasternal Heave.  8. Positive Bowel Sounds, Abdomen Soft, Non tender, No organomegaly appriciated,No rebound -guarding or rigidity.  9.  No Cyanosis, Normal Skin Turgor, No Skin Rash or Bruise.  10. Good muscle tone,  joints appear normal , no effusions, Normal ROM.    Data Review  CBC Recent Labs  Lab 04/10/18 1501  WBC 16.5*  HGB 11.9*  HCT 38.0  PLT 252  MCV 83.2  MCH 26.0  MCHC 31.3  RDW 14.4  LYMPHSABS 0.8  MONOABS 1.8*  EOSABS 0.0  BASOSABS 0.0    ------------------------------------------------------------------------------------------------------------------  Chemistries  Recent Labs  Lab 04/10/18 1501  NA 136  K 4.3  CL 100  CO2 25  GLUCOSE 277*  BUN 23  CREATININE 1.05*  CALCIUM 9.9  AST 40  ALT 43  ALKPHOS 542*  BILITOT 1.5*   ------------------------------------------------------------------------------------------------------------------ CrCl cannot be calculated (Unknown ideal weight.). ------------------------------------------------------------------------------------------------------------------ No results for input(s): TSH, T4TOTAL, T3FREE, THYROIDAB in the last 72 hours.  Invalid input(s): FREET3   Coagulation profile Recent Labs  Lab 04/10/18 1501  INR 1.2   ------------------------------------------------------------------------------------------------------------------- No results for input(s): DDIMER in the last 72 hours. -------------------------------------------------------------------------------------------------------------------  Cardiac Enzymes Recent Labs  Lab 04/10/18 1532  TROPONINI <0.03   ------------------------------------------------------------------------------------------------------------------ Invalid input(s): POCBNP   ---------------------------------------------------------------------------------------------------------------  Urinalysis    Component Value Date/Time   COLORURINE AMBER (A) 04/10/2018 1630   APPEARANCEUR HAZY (A) 04/10/2018 1630   LABSPEC 1.024 04/10/2018 1630   PHURINE 5.0 04/10/2018 1630   GLUCOSEU >=500 (A) 04/10/2018 1630   HGBUR SMALL (A) 04/10/2018 1630   BILIRUBINUR NEGATIVE 04/10/2018 1630   KETONESUR 5 (A) 04/10/2018 1630   PROTEINUR 100 (A) 04/10/2018 1630   NITRITE NEGATIVE 04/10/2018 1630   LEUKOCYTESUR NEGATIVE 04/10/2018 1630     ----------------------------------------------------------------------------------------------------------------   Imaging results:   Ct Head Wo Contrast  Result Date: 04/10/2018 CLINICAL DATA:  Difficulty walking. EXAM: CT HEAD WITHOUT CONTRAST TECHNIQUE: Contiguous axial images were obtained from the base of the skull through the vertex without intravenous contrast. COMPARISON:  None. FINDINGS: Brain: Mild diffuse cortical atrophy is noted. Moderate chronic ischemic white matter disease is noted. No mass effect or midline shift is noted. Ventricular size is within normal limits. There is no evidence of mass lesion, hemorrhage or acute infarction. Vascular: No hyperdense vessel or unexpected calcification. Skull: Normal. Negative for fracture or focal lesion. Sinuses/Orbits: No acute finding. Other: None. IMPRESSION: Mild diffuse cortical atrophy. Moderate chronic ischemic white matter disease. No acute intracranial abnormality seen. Electronically Signed   By: Lupita Raider, M.D.   On: 04/10/2018 15:50   [name]  Result Date: 04/10/2018 CLINICAL DATA:  Difficulty walking.  Leading to the left. EXAM: MRI HEAD WITHOUT CONTRAST TECHNIQUE: Multiplanar, multiecho pulse sequences of the brain and surrounding structures were obtained without intravenous contrast. COMPARISON:  Head CT 04/10/2018 FINDINGS: Some sequences are moderately to severely motion degraded. Axial T1 and coronal T2 sequences were not obtained. Brain: There is no evidence of acute infarct, intracranial hemorrhage, mass, midline shift, or extra-axial fluid collection. There is moderate cerebral atrophy. Confluent T2 hyperintensities in the cerebral white matter and patchy T2  hyperintensities in the pons are nonspecific but compatible with severe chronic small vessel ischemic disease. Chronic small vessel ischemic changes are also present in the deep gray nuclei bilaterally, and there is a chronic lacunar infarct in the  pons on the left. A partially empty sella is noted. Vascular: Major intracranial vascular flow voids are preserved. Skull and upper cervical spine: Unremarkable bone marrow signal. Sinuses/Orbits: Unremarkable orbits. Minimal left maxillary sinus mucosal thickening. Grossly clear mastoid air cells. Other: None. IMPRESSION: 1. Motion degraded, incomplete examination. 2. No acute infarct. 3. Severe chronic small vessel ischemic disease. Electronically Signed   By: Sebastian Ache M.D.   On: 04/10/2018 17:50   Dg Chest Portable 1 View  Result Date: 04/10/2018 CLINICAL DATA:  left side weakness for unknown amount of time. No known heart or lung surgeries. Found today Leaning towards her left side and not being able to walk ; pt alert and oriented x 2 and according to family members this is her baseline,pt usually able to walk ; pt alert and following commands ; last time she was seen normal was on Wednesday ; hx of left sided weakness from previous stroke but according to family that resolved" EXAM: PORTABLE CHEST - 1 VIEW COMPARISON:  none FINDINGS: Low lung volumes. No definite infiltrate or overt edema. Heart size upper limits normal for technique. Blunting of left lateral costophrenic angle. No pneumothorax. Visualized bones unremarkable. IMPRESSION: Low volumes. No acute cardiopulmonary disease. Electronically Signed   By: Corlis Leak M.D.   On: 04/10/2018 15:40    My personal review of EKG: Normal sinus rhythm at 99 bpm with PAC and LVH  Assessment & Plan  Hypertensive urgency Place in observation Norvasc/Lopressor.  Need to know her home medications Continue with PRN hydralazine and Lopressor IV  Hyperglycemia Probably history of diabetes mellitus ISS Need to get her home medications  ?  UTI Start Rocephin IV and monitor  Lower extremity weakness Did not notice in the emergency room PT/OT evaluation in a.m.  DVT Prophylaxis Lovenox  AM Labs Ordered, also please review Full  Orders  Family Communication: Unable/called no answer  Code Status full  Disposition Plan: Home  Time spent in minutes : 43 minutes  Condition GUARDED   @SIGNATURE @

## 2018-04-10 NOTE — ED Triage Notes (Addendum)
Pt brought in by ems from home; per grand daughter, patient was found today  Leaning towards her left side and not being able to walk ; pt alert and oriented x 2 and according to family members this is her baseline,pt usually able to walk ; pt alert and following commands ; last time she was seen normal was on Wednesday ; hx of left sided weakness from previous stroke but according to family that resolved

## 2018-04-11 ENCOUNTER — Inpatient Hospital Stay (HOSPITAL_COMMUNITY): Payer: Medicare HMO

## 2018-04-11 ENCOUNTER — Observation Stay (HOSPITAL_COMMUNITY): Payer: Medicare HMO

## 2018-04-11 DIAGNOSIS — M25552 Pain in left hip: Secondary | ICD-10-CM | POA: Diagnosis not present

## 2018-04-11 DIAGNOSIS — R262 Difficulty in walking, not elsewhere classified: Secondary | ICD-10-CM

## 2018-04-11 DIAGNOSIS — M25551 Pain in right hip: Secondary | ICD-10-CM

## 2018-04-11 DIAGNOSIS — D72829 Elevated white blood cell count, unspecified: Secondary | ICD-10-CM

## 2018-04-11 LAB — CBC
HCT: 35.1 % — ABNORMAL LOW (ref 36.0–46.0)
Hemoglobin: 11.4 g/dL — ABNORMAL LOW (ref 12.0–15.0)
MCH: 26.6 pg (ref 26.0–34.0)
MCHC: 32.5 g/dL (ref 30.0–36.0)
MCV: 82 fL (ref 80.0–100.0)
NRBC: 0 % (ref 0.0–0.2)
Platelets: 210 10*3/uL (ref 150–400)
RBC: 4.28 MIL/uL (ref 3.87–5.11)
RDW: 14.5 % (ref 11.5–15.5)
WBC: 15.1 10*3/uL — ABNORMAL HIGH (ref 4.0–10.5)

## 2018-04-11 LAB — BASIC METABOLIC PANEL WITH GFR
Anion gap: 13 (ref 5–15)
BUN: 26 mg/dL — ABNORMAL HIGH (ref 8–23)
CO2: 23 mmol/L (ref 22–32)
Calcium: 9.5 mg/dL (ref 8.9–10.3)
Chloride: 103 mmol/L (ref 98–111)
Creatinine, Ser: 1.01 mg/dL — ABNORMAL HIGH (ref 0.44–1.00)
GFR calc Af Amer: 58 mL/min — ABNORMAL LOW
GFR calc non Af Amer: 50 mL/min — ABNORMAL LOW
Glucose, Bld: 219 mg/dL — ABNORMAL HIGH (ref 70–99)
Potassium: 3.6 mmol/L (ref 3.5–5.1)
Sodium: 139 mmol/L (ref 135–145)

## 2018-04-11 LAB — GLUCOSE, CAPILLARY
GLUCOSE-CAPILLARY: 219 mg/dL — AB (ref 70–99)
Glucose-Capillary: 193 mg/dL — ABNORMAL HIGH (ref 70–99)
Glucose-Capillary: 294 mg/dL — ABNORMAL HIGH (ref 70–99)
Glucose-Capillary: 200 mg/dL — ABNORMAL HIGH (ref 70–99)

## 2018-04-11 MED ORDER — INSULIN ASPART 100 UNIT/ML ~~LOC~~ SOLN
0.0000 [IU] | Freq: Three times a day (TID) | SUBCUTANEOUS | Status: DC
  Administered 2018-04-12: 5 [IU] via SUBCUTANEOUS
  Administered 2018-04-12: 7 [IU] via SUBCUTANEOUS
  Administered 2018-04-12: 2 [IU] via SUBCUTANEOUS
  Administered 2018-04-13: 3 [IU] via SUBCUTANEOUS

## 2018-04-11 MED ORDER — ATORVASTATIN CALCIUM 40 MG PO TABS
40.0000 mg | ORAL_TABLET | Freq: Every day | ORAL | Status: DC
  Administered 2018-04-12 – 2018-04-13 (×2): 40 mg via ORAL
  Filled 2018-04-11 (×2): qty 1

## 2018-04-11 MED ORDER — IRBESARTAN 75 MG PO TABS
37.5000 mg | ORAL_TABLET | Freq: Every day | ORAL | Status: DC
  Administered 2018-04-11 – 2018-04-12 (×2): 37.5 mg via ORAL
  Filled 2018-04-11 (×4): qty 0.5

## 2018-04-11 NOTE — Progress Notes (Signed)
Family Medicine Teaching Service Daily Progress Note Intern Pager: [phone]  Patient name: Mary Walters Medical record number: [id number] Date of birth: 1928/12/26 Age: 85 y.o. Gender: female  Primary Care Provider: Patient, [name] Consultants: None Code Status: Full  Pt Overview and Major Events to Date:  Mary Walters is a 85y/o female with PMH of HTN, HLD, T2DM, CKD 3, Vascular Dementia, Hx of Stroke and Hx of falls who presented to the ED due to weakness found to have a UTI.  Assessment and Plan:  Weakness thought 2/2 UTI. Acute on Chronic. For now, secondary to UTI. U/A had many bacteria with leukocytosis of 15.3 today. Given patient history of falls and vascular dementia I am concerned about the fall. On exam this morning she has symmetric weakness of her upper extremities and she is very tender on her right LE with any passive movement. She refuses to move her right leg actively. She has a history of falling and not reporting so fracture needs to be ruled out. Likely the cause of her weakness is multifactorial. Hypotension could be a factor but she presented with HTN urgency. - Take over from [facility], Dr. Owens Shark attending - CTX IV (3/27-); transition to oral abx - F/u UCx - Bilat Hip X-rays - Orthostatic vitals - PT/OT eval and treat  Elevated Alk Phos. 542 on admission. Given age and history of multiple falls there is a concern that possible metastatic cancer is at the root of her instability and falls. - RUQ U/S - Consider MRI of back if RUQ ultrasound is normal  HTN. Chronic. Hypertensive urgency on admission with systolics over 109 for several hours. Given IV Labetalol and Hydralazine and started on amlodipine and metoprolol. SBP most recently in the 160-150 range. DBP 73-100 since this morning. On home candesartan 76m. - Candesartan 478mnot on formulary, 37.94m78mrbesartan is equivalent - Cont amlodipine 94mg36mDiscont Metoprolol 294mg34m  DM. Chronic.  Typically diet controlled and given age she does not need tight glucose control. A1c 7.2. Received a total of 8U of Aspart since admission. Not on any home medications - sSSI - Consider adding Lantus if BG consistently over 200  HLD. Chronic. On home Atorvastatin 40mg 54mnt Atorvastatin 40mg  66m3. Stable. 1.01 today. From chart review her baseline appears to be 1.00-1.20.  - Daily BMP  Hx of vascular dementia - Cont to monitor for changes in mental status - Consider head CT if AMS worsens  Low Vit D. Chronic. Last Vit D 13.6 in 08/25/2017. - Recheck Vit D level - Replete as needed  FEN/GI: carb modified diet PPx: Lovenox  Disposition: stable  Subjective:  Patient is laying down in bed and states she is comfortable. She states she felt "funny" for a little while before coming to the ED. She denies dysuria, increased frequency, or increased urgency. She has no chest pain or SOB. She is alert and knows [facility] is at [facility], and her name and birthday.  Objective: Temp:  [98.1 F (36.7 C)-100 F (37.8 C)] 98.3 F (36.8 C) (03/28 1116) Pulse Rate:  [66-105] 93 (03/28 1116) Resp:  [14-25] 16 (03/28 1116) BP: (159-230)/(74-115) 159/74 (03/28 1116) SpO2:  [95 %-100 %] 99 % (03/28 1116) Physical Exam: Gen: Alert and Oriented x 3, NAD HEENT: Normocephalic, atraumatic CV: RRR, 2/6 systolic murmur, normal S1, S2 split Resp: CTAB, no wheezing, rales, or rhonchi, comfortable work of breathing Abd: non-distended, non-tender, soft, +bs in all four quadrants  MSK: right leg TTP with little range of motion at the hip and knee joint Ext: no clubbing, cyanosis, or edema Neuro: CN II-XII intact, 3/5 gross sensation intact bilaterally for UE and LE, strength 3/5 in UE bilaterally 3/5 in LLE and 1/5 in RLE. Skin: warm, dry, intact, no rashes  Laboratory: Recent Labs  Lab 04/10/18 1501 04/11/18 0408  WBC 16.5* 15.1*  HGB 11.9* 11.4*  HCT 38.0 35.1*  PLT 252 210   Recent  Labs  Lab 04/10/18 1501 04/11/18 0408  NA 136 139  K 4.3 3.6  CL 100 103  CO2 25 23  BUN 23 26*  CREATININE 1.05* 1.01*  CALCIUM 9.9 9.5  PROT 7.8  --   BILITOT 1.5*  --   ALKPHOS 542*  --   ALT 43  --   AST 40  --   GLUCOSE 277* 219*     Imaging/Diagnostic Tests: DG Hip Bilat: IMPRESSION: No acute osseous abnormality. CXR: pending RUQ U/S: pending  Nuala Alpha, DO 04/11/2018, 1:07 PM PGY-2, [place] Intern pager: [phone], text pages welcome

## 2018-04-11 NOTE — Care Management Obs Status (Signed)
MEDICARE OBSERVATION STATUS NOTIFICATION   Patient Details  Name: Mary Walters MRN: [id number] Date of Birth: 09-25-1928   Medicare Observation Status Notification Given:  Yes    Sonia Bromell B Arvon Schreiner, LCSWA 04/11/2018, 2:09 PM

## 2018-04-11 NOTE — Evaluation (Signed)
Physical Therapy Evaluation Patient Details Name: Mary Walters MRN: [id number] DOB: 07-08-1928 Today's Date: 04/11/2018   History of Present Illness  Pt is an 85 y/o female admitted secondary to weakness and inability to walk. MRI of the brain was negative for any acute findings. Further work-up pending. No PMH in pt's chart.    Clinical Impression  Pt presented supine in bed with HOB elevated, awake and willing to participate in therapy session. Prior to admission, pt reported that she was independent with all functional mobility. However, pt confused throughout and no family/caregivers present to provide any reliable information. At the time of evaluation, pt was very limited secondary to weakness, pain and cognitive deficits. Pt would continue to benefit from skilled physical therapy services at this time while admitted and after d/c to address the below listed limitations in order to improve overall safety and independence with functional mobility.     Follow Up Recommendations SNF    Equipment Recommendations  None recommended by PT    Recommendations for Other Services       Precautions / Restrictions Precautions Precautions: Fall Restrictions Weight Bearing Restrictions: No      Mobility  Bed Mobility Overal bed mobility: Needs Assistance             General bed mobility comments: attempted rolling and sitting upright in bed; pt required total A for movement of bilateral LEs towards EOB, pelvic and trunk rotation towards R side and positioning of UEs. Pt screaming and crying out in pain, requsting therapist to stop. Attempted long sitting in bed with total A for trunk elevation; however, pt again screaming and crying out in pain, requesting therapist to stop  Transfers                 General transfer comment: unable  Ambulation/Gait                Stairs            Wheelchair Mobility    Modified Rankin (Stroke Patients Only)        Balance                                             Pertinent Vitals/Pain Pain Assessment: Faces Faces Pain Scale: Hurts whole lot Pain Location: with all mobility Pain Descriptors / Indicators: Grimacing;Guarding;Moaning Pain Intervention(s): Monitored during session    Home Living Family/patient expects to be discharged to:: Unsure                 Additional Comments: pt reported that she lives at home by herself and that her daughter lives near by. She stated that she does not have any steps/stairs to enter her home or once inside. Pt with confusion throughout and significant delay in responses; therefore, uncertain of accuracy of information provided. No family/caregivers present to provide any reliable information.    Prior Function Level of Independence: Independent               Hand Dominance   Dominant Hand: Left    Extremity/Trunk Assessment   Upper Extremity Assessment Upper Extremity Assessment: Difficult to assess due to impaired cognition;RUE deficits/detail;LUE deficits/detail RUE: Unable to fully assess due to pain LUE: Unable to fully assess due to pain    Lower Extremity Assessment Lower Extremity Assessment: Difficult to assess due to impaired cognition;LLE deficits/detail;RLE deficits/detail  RLE: Unable to fully assess due to pain LLE: Unable to fully assess due to pain       Communication   Communication: HOH;Expressive difficulties;Other (comment)(very delayed responses)  Cognition Arousal/Alertness: Awake/alert Behavior During Therapy: Flat affect Overall Cognitive Status: No family/caregiver present to determine baseline cognitive functioning Area of Impairment: Attention;Memory;Following commands;Safety/judgement;Awareness;Problem solving;Orientation                 Orientation Level: Disoriented to;Situation Current Attention Level: Sustained Memory: Decreased short-term memory Following Commands:  Follows one step commands inconsistently Safety/Judgement: Decreased awareness of deficits;Decreased awareness of safety Awareness: Intellectual Problem Solving: Slow processing;Decreased initiation;Difficulty sequencing;Requires verbal cues;Requires tactile cues        General Comments      Exercises     Assessment/Plan    PT Assessment Patient needs continued PT services  PT Problem List Decreased strength;Decreased activity tolerance;Decreased balance;Decreased mobility;Decreased cognition;Decreased coordination;Decreased knowledge of use of DME;Decreased safety awareness;Decreased knowledge of precautions;Pain       PT Treatment Interventions DME instruction;Gait training;Stair training;Functional mobility training;Therapeutic activities;Therapeutic exercise;Balance training;Neuromuscular re-education;Cognitive remediation;Patient/family education    PT Goals (Current goals can be found in the Care Plan section)  Acute Rehab PT Goals Patient Stated Goal: unable to state PT Goal Formulation: Patient unable to participate in goal setting Time For Goal Achievement: 04/25/18 Potential to Achieve Goals: Fair    Frequency Min 2X/week   Barriers to discharge        Co-evaluation               AM-PAC PT "6 Clicks" Mobility  Outcome Measure Help needed turning from your back to your side while in a flat bed without using bedrails?: Total Help needed moving from lying on your back to sitting on the side of a flat bed without using bedrails?: Total Help needed moving to and from a bed to a chair (including a wheelchair)?: Total Help needed standing up from a chair using your arms (e.g., wheelchair or bedside chair)?: Total Help needed to walk in hospital room?: Total Help needed climbing 3-5 steps with a railing? : Total 6 Click Score: 6    End of Session   Activity Tolerance: Patient limited by pain Patient left: in bed;with call bell/phone within reach;with bed  alarm set Nurse Communication: Mobility status PT Visit Diagnosis: Other abnormalities of gait and mobility (R26.89);Pain Pain - Right/Left: (bilateral) Pain - part of body: Leg;Shoulder;Arm    Time: 7673-4193 PT Time Calculation (min) (ACUTE ONLY): 20 min   Charges:   PT Evaluation $PT Eval Moderate Complexity: 1 Mod          Deborah Chalk, PT, DPT  Acute Rehabilitation Services Pager [phone] Office [phone]    Alessandra Bevels Kawehi Hostetter 04/11/2018, 11:56 AM

## 2018-04-11 NOTE — TOC Initial Note (Signed)
Transition of Care [facility]) - Initial/Assessment Note    Patient Details  Name: Mary Walters MRN: [id number] Date of Birth: 16-Feb-1928  Transition of Care [facility]) CM/SW Contact:    Gwenlyn Fudge, LCSWA Phone Number: 04/11/2018, 2:22 PM  Clinical Narrative:          CSW spoke with the patient's son and daughter in law. They are wanting to send the patient home to receive home health PT/OT. The requested that the CSW send the [facility] list by email. Patient is receiving medical care at [facility].   CSW received a phone call back from the patient's daughter in law. She is wanting to pursue SNF. Her mother was at [facility] before and they liked it. She will be speaking with her husband and they will make a decision together on HH vs. SNF. CSW emailed both CMS HH and SNF list.          Expected Discharge Plan: Home w Home Health Services Barriers to Discharge: Continued Medical Work up, English as a second language teacher   Patient Goals and CMS Choice Patient states their goals for this hospitalization and ongoing recovery are:: Pt son wants his mother to return home to receive rehab CMS Medicare.gov Compare Post Acute Care list provided to:: Patient Represenative (must comment)(Pt son, Delphinia Sendra) Choice offered to / list presented to : Adult Children  Expected Discharge Plan and Services Expected Discharge Plan: Home w Home Health Services In-house Referral: Clinical Social Work Discharge Planning Services: NA Post Acute Care Choice: Home Health Living arrangements for the past 2 months: Apartment                 DME Arranged: N/A DME Agency: (Awaiting choice from family) HH Arranged: NA HH Agency: NA(Awaiting choice from family)  Prior Living Arrangements/Services Living arrangements for the past 2 months: Apartment Lives with:: Self Patient language and need for interpreter reviewed:: No Do you feel safe going back to the place where you live?: Yes      Need for Family  Participation in Patient Care: Yes (Comment) Care giver support system in place?: Yes (comment)   Criminal Activity/Legal Involvement Pertinent to Current Situation/Hospitalization: No - Comment as needed  Activities of Daily Living   ADL Screening (condition at time of admission) Patient's cognitive ability adequate to safely complete daily activities?: No Is the patient deaf or have difficulty hearing?: No Does the patient have difficulty seeing, even when wearing glasses/contacts?: No Does the patient have difficulty concentrating, remembering, or making decisions?: No Patient able to express need for assistance with ADLs?: Yes Does the patient have difficulty dressing or bathing?: Yes Independently performs ADLs?: No Communication: Independent Dressing (OT): Needs assistance Is this a change from baseline?: Pre-admission baseline Grooming: Needs assistance Is this a change from baseline?: Pre-admission baseline Feeding: Needs assistance Is this a change from baseline?: Pre-admission baseline Bathing: Needs assistance Is this a change from baseline?: Pre-admission baseline Toileting: Needs assistance Is this a change from baseline?: Pre-admission baseline In/Out Bed: Needs assistance Is this a change from baseline?: Pre-admission baseline Walks in Home: Dependent Is this a change from baseline?: Pre-admission baseline Does the patient have difficulty walking or climbing stairs?: Yes Weakness of Legs: Both Weakness of Arms/Hands: Both  Permission Sought/Granted Permission sought to share information with : Case Manager Permission granted to share information with : Yes, Verbal Permission Granted  Share Information with NAME: Pennelope Bracken  Permission granted to share info w AGENCY: Declining SNF  Permission granted  to share info w Relationship: Son and daughter in law     Emotional Assessment Appearance:: Appears stated age Attitude/Demeanor/Rapport: Unable to  Assess Affect (typically observed): Unable to Assess Orientation: : Oriented to Self, Oriented to Place Alcohol / Substance Use: Not Applicable Psych Involvement: No (comment)  Admission diagnosis:  Dehydration [E86.0] Hyperglycemia [R73.9] Hypertensive urgency [I16.0] Acute cystitis without hematuria [N30.00] Ambulatory dysfunction [R26.2] Patient Active Problem List   Diagnosis Date Noted  . Hypertensive urgency 04/10/2018   PCP:  Patient, [name] Pharmacy:  No Pharmacies Listed    Social Determinants of Health (SDOH) Interventions    Readmission Risk Interventions Readmission Risk Prevention Plan 04/11/2018  Post Dischage Appt Complete  Medication Screening Complete  Transportation Screening Complete

## 2018-04-11 NOTE — Progress Notes (Signed)
   04/11/18 1820  Orthostatic Lying   BP- Lying 162/73 (MAP 95)  Pulse- Lying 89  Orthostatic Sitting  BP- Sitting 162/73 (MAP 96)  Pulse- Sitting 93  Orthostatic Standing at 0 minutes  BP- Standing at 0 minutes  (Pt too weak to stand, will reattempt at a later time)    Unable to do orthostatic vitals at this time, patient is too weak. Will pass on to oncoming nurse to attempt again later.

## 2018-04-11 NOTE — Progress Notes (Signed)
CSW received consult to assist in locating family. Per RN, patient's daughter called, [name]: [phone]/ Melrose Nakayama: [phone]. Patient's contacts updated.   Osborne Casco Keerat Denicola LCSW [phone]

## 2018-04-12 ENCOUNTER — Inpatient Hospital Stay (HOSPITAL_COMMUNITY): Payer: Medicare HMO

## 2018-04-12 DIAGNOSIS — E860 Dehydration: Secondary | ICD-10-CM

## 2018-04-12 DIAGNOSIS — D72825 Bandemia: Secondary | ICD-10-CM

## 2018-04-12 DIAGNOSIS — R29898 Other symptoms and signs involving the musculoskeletal system: Secondary | ICD-10-CM

## 2018-04-12 DIAGNOSIS — R748 Abnormal levels of other serum enzymes: Secondary | ICD-10-CM

## 2018-04-12 LAB — GLUCOSE, CAPILLARY
Glucose-Capillary: 199 mg/dL — ABNORMAL HIGH (ref 70–99)
Glucose-Capillary: 325 mg/dL — ABNORMAL HIGH (ref 70–99)
Glucose-Capillary: 295 mg/dL — ABNORMAL HIGH (ref 70–99)

## 2018-04-12 LAB — SAVE SMEAR(SSMR), FOR PROVIDER SLIDE REVIEW

## 2018-04-12 LAB — COMPREHENSIVE METABOLIC PANEL
ALT: 35 U/L (ref 0–44)
AST: 35 U/L (ref 15–41)
Albumin: 2.4 g/dL — ABNORMAL LOW (ref 3.5–5.0)
Alkaline Phosphatase: 422 U/L — ABNORMAL HIGH (ref 38–126)
Anion gap: 8 (ref 5–15)
BUN: 34 mg/dL — ABNORMAL HIGH (ref 8–23)
CO2: 23 mmol/L (ref 22–32)
Calcium: 9.5 mg/dL (ref 8.9–10.3)
Chloride: 108 mmol/L (ref 98–111)
Creatinine, Ser: 1.24 mg/dL — ABNORMAL HIGH (ref 0.44–1.00)
GFR calc Af Amer: 45 mL/min — ABNORMAL LOW (ref >60)
GFR calc non Af Amer: 39 mL/min — ABNORMAL LOW (ref >60)
Glucose, Bld: 223 mg/dL — ABNORMAL HIGH (ref 70–99)
Potassium: 3.6 mmol/L (ref 3.5–5.1)
SODIUM: 139 mmol/L (ref 135–145)
Total Bilirubin: 0.7 mg/dL (ref 0.3–1.2)
Total Protein: 6.8 g/dL (ref 6.5–8.1)

## 2018-04-12 LAB — URINE CULTURE

## 2018-04-12 MED ORDER — INSULIN GLARGINE 100 UNIT/ML ~~LOC~~ SOLN
5.0000 [IU] | Freq: Every day | SUBCUTANEOUS | Status: DC
  Administered 2018-04-13 – 2018-04-14 (×2): 5 [IU] via SUBCUTANEOUS
  Filled 2018-04-12 (×2): qty 0.05

## 2018-04-12 NOTE — Progress Notes (Signed)
**Patient's chart should be merged with her original chart Mary Walters [id number]. Please find additional health hx there.  Family Medicine Teaching Service Daily Progress Note Intern Pager: [phone]  Patient name: Mary Walters Medical record number: [id number] Date of birth: February 12, 1928 Age: 85 y.o. Gender: female  Primary Care Provider: Ralene Ok, MD Consultants: None Code Status: Full  Pt Overview and Major Events to Date:  Mary Walters is a 85y/o female with PMH of HTN, HLD, T2DM, CKD 3, Vascular Dementia, Hx of Stroke and Hx of falls who presented to the ED due to weakness found to have a UTI.  Assessment and Plan:  Weakness thought 2/2 UTI. Acute on Chronic. For now, secondary to UTI. U/A had many bacteria with leukocytosis of 15.3 today. On exam this morning she has symmetric weakness of her upper extremities and will not allow passive movement of her RLE. Bilateral Hip XR without fracture. Likely the cause of her weakness is multifactorial. Orthostatic vitals unable to be completed as she was unable to stand. PT has recommended SNF.  - CTX IV (3/27-); transition to oral abx as culture results - F/u UCx - Orthostatic vitals - PT/OT eval and treat -consider palliative consult for GOC  Elevated Alk Phos. 542 on admission. Given age and history of multiple falls there is a concern that possible metastatic cancer is at the root of her instability and falls. RUQ U/S with sludge, benign appearing hepatic cysts.  - Consider MRI of back  HTN. Chronic. Hypertensive urgency on admission. SBP most recently in the 160-150 range. DBP 73-100 since this morning. On home candesartan 21m. - Candesartan 456mnot on formulary, 37.36m73mrbesartan is equivalent - Cont amlodipine 36mg536mDiscont Metoprolol 236mg28m  DM. Chronic. Typically diet controlled and given age she does not need tight glucose control. A1c 7.2. Received a total of 8U of Aspart since admission. Not on any home  medications - sSSI - Consider adding Lantus if BG consistently over 200  HLD. Chronic. On home Atorvastatin 40mg 36mnt Atorvastatin 40mg  67m3. Stable. 1.24 today with resumption of ARB, likely due to this, increase is less than 30%, we will not treat as AKI. BL appears to be 1.00-1.20.  - Daily BMP  Hx of vascular dementia - Cont to monitor for changes in mental status - Consider head CT if AMS worsens  Low Vit D. Chronic. Last Vit D 13.6 in 08/25/2017. - Recheck Vit D level - Replete as needed  FEN/GI: carb modified diet PPx: Lovenox  Disposition: stable  Subjective:  Mary Walters in bed today without acute concern. She says "mhmm" to many questions, but does remember me from my office. She repeats my questions back rather than answering them. Denying any dysuria now or on admission, and denies pain today. She doesn't want me to move her RLE, but she says it doesn't hurt, she was afraid it was going to hurt.   Objective: Temp:  [98.1 F (36.7 C)-99.7 F (37.6 C)] 98.9 F (37.2 C) (03/29 0403) Pulse Rate:  [87-97] 96 (03/29 0403) Resp:  [16-18] 16 (03/29 0403) BP: (123-185)/(73-111) 123/111 (03/29 0403) SpO2:  [88 %-100 %] 88 % (03/29 0403) Physical Exam:  Gen: Alert and Oriented x 3, NAD HEENT: Normocephalic, atraumatic CV: RRR, 2/6 systolic murmur, normal S1, S2 split Resp: CTAB, no wheezing, rales, or rhonchi, comfortable work of breathing Abd: non-distended, non-tender, soft, +bs in all four quadrants MSK: 2/5 strength of LLE, 0/5 strength of  RLE, patient cried "don't" when I attempted to passively raise her RLE.  Ext: no clubbing, cyanosis, or edema Neuro: CN II-XII intact, 3/5 gross sensation intact bilaterally for UE and LE, strength 3/5 in UE Skin: warm, dry, intact, no rashes  Laboratory: Recent Labs  Lab 04/10/18 1501 04/11/18 0408  WBC 16.5* 15.1*  HGB 11.9* 11.4*  HCT 38.0 35.1*  PLT 252 210   Recent Labs  Lab 04/10/18 1501 04/11/18 0408  04/12/18 0444  NA 136 139 139  K 4.3 3.6 3.6  CL 100 103 108  CO2 _0 BUN 23 26* 34*  CREATININE 1.05* 1.01* 1.24*  CALCIUM 9.9 9.5 9.5  PROT 7.8  --  6.8  BILITOT 1.5*  --  0.7  ALKPHOS 542*  --  422*  ALT 43  --  35  AST 40  --  35  GLUCOSE 277* 219* 223*     Imaging/Diagnostic Tests: DG Hip Bilat: IMPRESSION: No acute osseous abnormality. Dg Chest 2 View  Result Date: 04/11/2018 CLINICAL DATA:  Leukocytosis. EXAM: CHEST - 2 VIEW COMPARISON:  Radiograph yesterday. FINDINGS: Low lung volumes persist. Unchanged heart size and mediastinal contours. Mild elevation of right hemidiaphragm on the lateral view. Chronic blunting of left costophrenic angle. No acute airspace disease, pulmonary edema, pleural effusion or pneumothorax. IMPRESSION: Low lung volumes without acute abnormality. Electronically Signed   By: Keith Rake M.D.   On: 04/11/2018 19:31   Dg Hips Bilat With Pelvis 3-4 Views  Result Date: 04/11/2018 CLINICAL DATA:  Pain.  No trauma history submitted. EXAM: DG HIP (WITH OR WITHOUT PELVIS) 3-4V BILAT COMPARISON:  None. FINDINGS: AP view the pelvis and AP/frog leg views of both hips. Femoral heads are located. Vascular calcifications. Mild convex left lower lumbar spine curvature. Joint spaces are maintained for age. No acute fracture. Degenerate changes involve the right sacroiliac joint. IMPRESSION: No acute osseous abnormality. Electronically Signed   By: Abigail Miyamoto M.D.   On: 04/11/2018 14:52   US Abdomen Limited Ruq  Result Date: 04/11/2018 CLINICAL DATA:  Leukocytosis.  Falls. EXAM: ULTRASOUND ABDOMEN LIMITED RIGHT UPPER QUADRANT COMPARISON:  None. FINDINGS: Gallbladder: Polypoid sludge versus less likely true mass/polyp of the gallbladder measuring 2.8 by 1.5 by 1.8 cm. Doppler assessment did not demonstrate internal flow within this lesion, which tends to favor sludge over malignancy. Otherwise the gallbladder is unremarkable. Common bile duct: Diameter: 9 mm  in diameter common no directly visualized choledocholithiasis. Liver: Several benign hepatic cysts are present. No focal solid lesion or worrisome hepatic lesion. Portal vein is patent on color Doppler imaging with normal direction of blood flow towards the liver. Other: Incidental visualization of simple appearing right renal cysts. Today's exam was not a dedicated renal ultrasound. IMPRESSION: 1. Polypoid lesion in the gallbladder does not have internal blood flow detected and is thus thought to most likely represent tumefactive sludge rather than a mass. 2. Several hepatic and right renal cysts are present with benign sonographic characteristics. Common bile duct 9 mm in diameter, borderline prominent for age. Electronically Signed   By: Van Clines M.D.   On: 04/11/2018 20:02     Sela Hilding, MD 04/12/2018, 6:34 AM PGY-3, [facility] pager: [phone], text pages welcome

## 2018-04-12 NOTE — Progress Notes (Signed)
Called patient's daughter-in-law Sunday Spillers and also talked to her granddaughter Desiree.  Explained that the right upper quadrant ultrasound was unclear in resulting something to cause her elevation of alk phos.  Discussed that we would be happy to look at her back with an MRI to assess for possible abnormalities, but this would include the possibility of metastases to this area.  We do not have any definitive knowledge of any cancer at this time, and I did explain to the family that we may find cancer that may not be able to be treated. There may also be a surgical abnormality which she may or may not be a good candidate for surgery for.  They would like to proceed with the imaging.  Orders placed.  They asked if we could call when we get the results, I did explain that sometimes MRIs that are not stat take several hours to occur.  Ralene Ok, MD

## 2018-04-13 DIAGNOSIS — R52 Pain, unspecified: Secondary | ICD-10-CM

## 2018-04-13 DIAGNOSIS — E559 Vitamin D deficiency, unspecified: Secondary | ICD-10-CM

## 2018-04-13 LAB — CBC WITH DIFFERENTIAL/PLATELET
Abs Immature Granulocytes: 0.08 10*3/uL — ABNORMAL HIGH (ref 0.00–0.07)
Basophils Absolute: 0 10*3/uL (ref 0.0–0.1)
Basophils Relative: 0 %
EOS PCT: 0 %
Eosinophils Absolute: 0 10*3/uL (ref 0.0–0.5)
HCT: 36.9 % (ref 36.0–46.0)
HEMOGLOBIN: 11.5 g/dL — AB (ref 12.0–15.0)
Immature Granulocytes: 1 %
LYMPHS PCT: 12 %
Lymphs Abs: 1.3 10*3/uL (ref 0.7–4.0)
MCH: 25.5 pg — ABNORMAL LOW (ref 26.0–34.0)
MCHC: 31.2 g/dL (ref 30.0–36.0)
MCV: 81.8 fL (ref 80.0–100.0)
Monocytes Absolute: 1.2 10*3/uL — ABNORMAL HIGH (ref 0.1–1.0)
Monocytes Relative: 11 %
Neutro Abs: 8.3 10*3/uL — ABNORMAL HIGH (ref 1.7–7.7)
Neutrophils Relative %: 76 %
Platelets: 245 10*3/uL (ref 150–400)
RBC: 4.51 MIL/uL (ref 3.87–5.11)
RDW: 14.4 % (ref 11.5–15.5)
WBC: 10.8 10*3/uL — AB (ref 4.0–10.5)
nRBC: 0 % (ref 0.0–0.2)

## 2018-04-13 LAB — VITAMIN D 25 HYDROXY (VIT D DEFICIENCY, FRACTURES): Vit D, 25-Hydroxy: 18.4 ng/mL — ABNORMAL LOW (ref 30.0–100.0)

## 2018-04-13 LAB — COMPREHENSIVE METABOLIC PANEL
ALT: 34 U/L (ref 0–44)
AST: 34 U/L (ref 15–41)
Albumin: 2.3 g/dL — ABNORMAL LOW (ref 3.5–5.0)
Alkaline Phosphatase: 424 U/L — ABNORMAL HIGH (ref 38–126)
Anion gap: 9 (ref 5–15)
BUN: 29 mg/dL — ABNORMAL HIGH (ref 8–23)
CO2: 24 mmol/L (ref 22–32)
Calcium: 9.6 mg/dL (ref 8.9–10.3)
Chloride: 105 mmol/L (ref 98–111)
Creatinine, Ser: 0.99 mg/dL (ref 0.44–1.00)
GFR calc Af Amer: 59 mL/min — ABNORMAL LOW (ref >60)
GFR calc non Af Amer: 51 mL/min — ABNORMAL LOW (ref >60)
Glucose, Bld: 245 mg/dL — ABNORMAL HIGH (ref 70–99)
Potassium: 3.8 mmol/L (ref 3.5–5.1)
Sodium: 138 mmol/L (ref 135–145)
Total Bilirubin: 0.6 mg/dL (ref 0.3–1.2)
Total Protein: 6.6 g/dL (ref 6.5–8.1)

## 2018-04-13 LAB — GLUCOSE, CAPILLARY
Glucose-Capillary: 269 mg/dL — ABNORMAL HIGH (ref 70–99)
Glucose-Capillary: 257 mg/dL — ABNORMAL HIGH (ref 70–99)
Glucose-Capillary: 105 mg/dL — ABNORMAL HIGH (ref 70–99)

## 2018-04-13 MED ORDER — INSULIN ASPART 100 UNIT/ML ~~LOC~~ SOLN
0.0000 [IU] | Freq: Three times a day (TID) | SUBCUTANEOUS | Status: DC
  Administered 2018-04-13 (×2): 8 [IU] via SUBCUTANEOUS
  Administered 2018-04-14 (×2): 2 [IU] via SUBCUTANEOUS

## 2018-04-13 MED ORDER — ACETAMINOPHEN 325 MG PO TABS
650.0000 mg | ORAL_TABLET | ORAL | Status: DC | PRN
  Administered 2018-04-13: 650 mg via ORAL
  Filled 2018-04-13: qty 2

## 2018-04-13 MED ORDER — IRBESARTAN 150 MG PO TABS
75.0000 mg | ORAL_TABLET | Freq: Every day | ORAL | Status: DC
  Administered 2018-04-13 – 2018-04-14 (×2): 75 mg via ORAL
  Filled 2018-04-13 (×2): qty 1

## 2018-04-13 NOTE — Progress Notes (Signed)
Responded to PIV consult. Pt on phone call and requests return at a later time.

## 2018-04-13 NOTE — NC FL2 (Signed)
[place] MEDICAID FL2 LEVEL OF CARE SCREENING TOOL     IDENTIFICATION  Patient Name: Mary Walters Birthdate: 03-Mar-1928 Sex: female Admission Date (Current Location): 04/10/2018  [facility] and [place] Number:  Producer, television/film/video and Address:  [facility], [address], [place], [place] [address]      Provider Number: [id number]  Attending Physician Name and Address:  Westley Chandler, MD  Relative Name and Phone Number:  Fayne, Burch, [phone]    Current Level of Care: Hospital Recommended Level of Care: Skilled Nursing Facility Prior Approval Number: [id number]  Date Approved/Denied: 05/15/17 PASRR Number:    Discharge Plan: SNF    Current Diagnoses: Patient Active Problem List   Diagnosis Date Noted  . Hypertensive urgency 04/10/2018    Orientation RESPIRATION BLADDER Height & Weight     Self, Time  Normal Incontinent Weight:   Height:     BEHAVIORAL SYMPTOMS/MOOD NEUROLOGICAL BOWEL NUTRITION STATUS      Incontinent Diet(Heart Healthy/Carb Mod, thin liquids)  AMBULATORY STATUS COMMUNICATION OF NEEDS Skin   Extensive Assist Verbally Normal                       Personal Care Assistance Level of Assistance  Bathing, Feeding, Dressing, Total care Bathing Assistance: Maximum assistance Feeding assistance: Limited assistance Dressing Assistance: Maximum assistance Total Care Assistance: Maximum assistance   Functional Limitations Info  Sight, Hearing, Speech Sight Info: Adequate Hearing Info: Adequate Speech Info: Adequate    SPECIAL CARE FACTORS FREQUENCY  PT (By licensed PT), OT (By licensed OT)     PT Frequency: 5x/wk OT Frequency: 5x/wk            Contractures Contractures Info: Not present    Additional Factors Info  Code Status, Allergies, Insulin Sliding Scale Code Status Info: Full Code Allergies Info: No Known Allergies   Insulin Sliding Scale Info: insulin aspart novolog 0-15 units and  insulin glargine (lantus) 5 units daily       Current Medications (04/13/2018):  This is the current hospital active medication list Current Facility-Administered Medications  Medication Dose Route Frequency Provider Last Rate Last Dose  . 0.9 %  sodium chloride infusion  250 mL Intravenous PRN Carron Curie, MD      . acetaminophen (TYLENOL) tablet 650 mg  650 mg Oral Q4H PRN Peggyann Shoals C, DO   650 mg at 04/13/18 1104  . amLODipine (NORVASC) tablet 5 mg  5 mg Oral Daily Carron Curie, MD   5 mg at 04/13/18 0913  . atorvastatin (LIPITOR) tablet 40 mg  40 mg Oral q1800 Lockamy, Timothy, DO   40 mg at 04/12/18 1711  . enoxaparin (LOVENOX) injection 40 mg  40 mg Subcutaneous Q24H Carron Curie, MD   40 mg at 04/12/18 2152  . insulin aspart (novoLOG) injection 0-15 Units  0-15 Units Subcutaneous TID WC Ellwood Dense, DO   8 Units at 04/13/18 1206  . insulin glargine (LANTUS) injection 5 Units  5 Units Subcutaneous Daily Ellwood Dense, DO   5 Units at 04/13/18 0913  . irbesartan (AVAPRO) tablet 75 mg  75 mg Oral Daily Peggyann Shoals C, DO   75 mg at 04/13/18 0913  . ondansetron (ZOFRAN) tablet 4 mg  4 mg Oral Q6H PRN Carron Curie, MD       Or  . ondansetron (ZOFRAN) injection 4 mg  4 mg Intravenous Q6H PRN Carron Curie, MD      . sodium  chloride flush (NS) 0.9 % injection 3 mL  3 mL Intravenous Q12H Carron Curie, MD   3 mL at 04/13/18 0914  . sodium chloride flush (NS) 0.9 % injection 3 mL  3 mL Intravenous PRN Carron Curie, MD         Discharge Medications: Please see discharge summary for a list of discharge medications.  Relevant Imaging Results:  Relevant Lab Results:   Additional Information SSI: [id number]  Nada Boozer Marbeth Smedley, LCSWA

## 2018-04-13 NOTE — Evaluation (Addendum)
Occupational Therapy Evaluation Patient Details Name: Mary Walters MRN: [id number] DOB: [birth year] Today's Date: 04/13/2018    History of Present Illness Pt is an 85 y/o female admitted secondary to weakness and inability to walk. MRI of the brain was negative for any acute findings. Further work-up pending. No PMH in pt's chart.   Clinical Impression   This 85 y/o female presents with the above. PLOF partly obtained via chart review and pt report; PTA pt was receiving assist from family for completion of ADL. Pt currently presents with significant pain in RLE, generalized weakness, decreased sitting balance impacting her functional performance. Pt requiring max-totalA+2 for completion of bed mobility - able to achieve sitting EOB however is not able to tolerate full upright posture and is resistive to attempts to correct. Pt with strong posterior lean and requires max-totalA for sitting balance EOB. Pt with painful RLE with all aspects of mobility (pre-medicated prior to session). She currently requires minA for simple grooming ADL, requires max-totalA for LB ADL and totalA for peri-care at bed level after episode of incontinence. She will benefit from continued acute OT services and recommend follow up therapy services in SNF setting after discharge to maximize her safety and independence with ADL and mobility.     Follow Up Recommendations  SNF;Supervision/Assistance - 24 hour    Equipment Recommendations  Wheelchair (measurements OT);Wheelchair cushion (measurements OT);Hospital bed(to be further assessed in next venue)           Precautions / Restrictions Precautions Precautions: Fall Restrictions Weight Bearing Restrictions: No      Mobility Bed Mobility Overal bed mobility: Needs Assistance Bed Mobility: Rolling;Supine to Sit;Sit to Supine Rolling: Total assist;+2 for physical assistance;+2 for safety/equipment   Supine to sit: Max assist;Total assist;+2 for physical  assistance;+2 for safety/equipment Sit to supine: Total assist;+2 for physical assistance;+2 for safety/equipment   General bed mobility comments: pt with significant pain in RLE with any mobility attempts, resists certain movements; requires assist for LEs over EOB and to elevate trunk - pt with strong posterior lean due to pain with increased hip flexion requiring increased assist to maintain sitting balance, pt tending to maintain wt bearing through L elbow as well to offset wt in R side; totalA to roll towards L/R to change bed pads end of session due to incontinence   Transfers                 General transfer comment: unable    Balance Overall balance assessment: Needs assistance Sitting-balance support: Feet supported;Single extremity supported Sitting balance-Leahy Scale: Poor Sitting balance - Comments: max-totalA for sitting balance, strong posterior lean due to RLE pain Postural control: Posterior lean                                 ADL either performed or assessed with clinical judgement   ADL Overall ADL's : Needs assistance/impaired Eating/Feeding: Set up;Sitting;Bed level   Grooming: Wash/dry hands;Set up;Minimal assistance;Bed level Grooming Details (indicate cue type and reason): setup assist for face washing task; minA as pt dropped washcloth at one point and didn't realize - continuing to wash face with her hand Upper Body Bathing: Moderate assistance;Bed level   Lower Body Bathing: Maximal assistance;Total assistance;+2 for physical assistance;+2 for safety/equipment;Bed level;Sitting/lateral leans   Upper Body Dressing : Moderate assistance;Bed level   Lower Body Dressing: Maximal assistance;Total assistance;+2 for physical assistance;+2 for safety/equipment;Bed level;Sitting/lateral leans  Toileting- Clothing Manipulation and Hygiene: Total assistance;+2 for physical assistance;+2 for safety/equipment;Sitting/lateral lean;Bed  level Toileting - Clothing Manipulation Details (indicate cue type and reason): pt noted to be incontinent of urine - required totalA for peri-care at bed level and for changing of bed linens     Functional mobility during ADLs: Maximal assistance;Total assistance;+2 for physical assistance;+2 for safety/equipment General ADL Comments: attempted to sit EOB however pt with significant pain in RLE with any mobility - tolerates EOB though unable to facilitate trunk fully upright, pt resistive to attempts to correct     Vision         Perception     Praxis      Pertinent Vitals/Pain Pain Assessment: Faces Faces Pain Scale: Hurts whole lot Pain Location: with all mobility Pain Descriptors / Indicators: Grimacing;Guarding;Moaning;Crying Pain Intervention(s): Monitored during session;Repositioned;Limited activity within patient's tolerance;Premedicated before session     Hand Dominance Left   Extremity/Trunk Assessment Upper Extremity Assessment Upper Extremity Assessment: RUE deficits/detail;LUE deficits/detail;Difficult to assess due to impaired cognition RUE Deficits / Details: tightness noted with attempts at [facility], generalized weakness LUE Deficits / Details: generalized weakness, will further assess   Lower Extremity Assessment Lower Extremity Assessment: Defer to PT evaluation       Communication Communication Communication: HOH;Expressive difficulties;Other (comment)(very delayed responses)   Cognition Arousal/Alertness: Awake/alert Behavior During Therapy: Flat affect Overall Cognitive Status: No family/caregiver present to determine baseline cognitive functioning Area of Impairment: Attention;Memory;Following commands;Safety/judgement;Awareness;Problem solving;Orientation                 Orientation Level: Disoriented to;Situation Current Attention Level: Sustained Memory: Decreased short-term memory Following Commands: Follows one step commands  inconsistently;Follows one step commands with increased time Safety/Judgement: Decreased awareness of deficits;Decreased awareness of safety Awareness: Intellectual Problem Solving: Slow processing;Decreased initiation;Difficulty sequencing;Requires verbal cues;Requires tactile cues General Comments: pt with baseline cognitive impairments; able to follow commands given increased time and cues to do so    General Comments       Exercises     Shoulder Instructions      Home Living Family/patient expects to be discharged to:: Private residence Living Arrangements: Children Available Help at Discharge: Family Type of Home: House Home Access: Stairs to enter     Home Layout: One level     Bathroom Shower/Tub: Producer, television/film/video: Standard     Home Equipment: Wheelchair - Manufacturing systems engineer   Additional Comments: unsure of pt accuracy with PLOF; pt reports her son lives with her, but then later reports he lives nearby, often only responding to yes/no questions in relation to home setup       Prior Functioning/Environment Level of Independence: Independent                 OT Problem List: Decreased strength;Decreased range of motion;Decreased activity tolerance;Impaired balance (sitting and/or standing);Decreased cognition;Decreased safety awareness;Decreased knowledge of use of DME or AE;Decreased knowledge of precautions;Pain      OT Treatment/Interventions: Self-care/ADL training;Therapeutic exercise;Neuromuscular education;Energy conservation;Therapeutic activities;Patient/family education;Balance training;Cognitive remediation/compensation;DME and/or AE instruction    OT Goals(Current goals can be found in the care plan section) Acute Rehab OT Goals Patient Stated Goal: less pain OT Goal Formulation: With patient Time For Goal Achievement: 04/27/18 Potential to Achieve Goals: Fair  OT Frequency: Min 2X/week   Barriers to D/C:             Co-evaluation PT/OT/SLP Co-Evaluation/Treatment: Yes Reason for Co-Treatment: Complexity of the patient's impairments (multi-system involvement);Necessary to address cognition/behavior during  functional activity;For patient/therapist safety;To address functional/ADL transfers   OT goals addressed during session: ADL's and self-care;Strengthening/ROM      AM-PAC OT "6 Clicks" Daily Activity     Outcome Measure Help from another person eating meals?: A Little Help from another person taking care of personal grooming?: A Little Help from another person toileting, which includes using toliet, bedpan, or urinal?: Total Help from another person bathing (including washing, rinsing, drying)?: A Lot Help from another person to put on and taking off regular upper body clothing?: A Lot Help from another person to put on and taking off regular lower body clothing?: Total 6 Click Score: 12   End of Session Nurse Communication: Mobility status  Activity Tolerance: Patient tolerated treatment well;Patient limited by pain Patient left: in bed;with call bell/phone within reach;with bed alarm set;with nursing/sitter in room  OT Visit Diagnosis: Muscle weakness (generalized) (M62.81);Pain;Other abnormalities of gait and mobility (R26.89) Pain - Right/Left: Right Pain - part of body: Hip;Knee;Leg                Time: 1141-1210 OT Time Calculation (min): 29 min Charges:  OT General Charges $OT Visit: 1 Visit OT Evaluation $OT Eval Moderate Complexity: 1 Mod  Marcy Siren, OT [employer] Pager [phone] Office [phone]   Orlando Penner 04/13/2018, 1:24 PM

## 2018-04-13 NOTE — TOC Progression Note (Signed)
Transition of Care [facility]) - Progression Note    Patient Details  Name: Mary Walters MRN: [id number] Date of Birth: 08/18/1928  Transition of Care [facility]) CM/SW Contact  Nada Boozer Aizlyn Schifano, LCSWA Phone Number: 04/13/2018, 2:07 PM  Clinical Narrative:   CSW spoke with patient's son. Family is now agreeable to SNF. They gave permission to fax out to "top-rated" SNF's. Will need to give bed offers to patient's son once received.     Expected Discharge Plan: Skilled Nursing Facility Barriers to Discharge: Continued Medical Work up, English as a second language teacher  Expected Discharge Plan and Services Expected Discharge Plan: Skilled Nursing Facility In-house Referral: Clinical Social Work Discharge Planning Services: NA Post Acute Care Choice: Home Health Living arrangements for the past 2 months: Apartment                 DME Arranged: N/A DME Agency: (Awaiting choice from family) HH Arranged: NA HH Agency: NA(Awaiting choice from family)   Social Determinants of Health (SDOH) Interventions    Readmission Risk Interventions Readmission Risk Prevention Plan 04/11/2018  Post Dischage Appt Complete  Medication Screening Complete  Transportation Screening Complete

## 2018-04-13 NOTE — Discharge Summary (Signed)
[facility] Discharge Summary  Patient name: Mary Walters Medical record number: [id number] Date of birth: 18-Sep-1928 Age: 85 y.o. Gender: female Date of Admission: 04/10/2018  Date of Discharge: 04/14/2018 Admitting Physician: Merton Border, MD  Primary Care Provider: Patient, [name] Consultants: None  Indication for Hospitalization: weakness s/p fall, UTI  Discharge Diagnoses/Problem List:  Acute on chronic bilateral lower extremity weakness Hypertension, stable Diabetes, stable Hyperlipidemia, stable CKD 3, stable History of vascular dementia  Disposition: Skilled nursing facility  Discharge Condition: Stable  Discharge Exam:  Gen: Pleasant elderly female, lying in bed, in no apparent distress HEENT: Mucous membranes moist, poor dentition Neck: Regular rate and rhythm, no murmur Lungs: Clear to auscultation bilaterally, normal work of breathing on room air Abdomen: Soft, nontender nondistended Extremities: Tenderness with passive flexion of right leg (patient states pain is in right knee), no erythema, swelling, point tenderness along joint lines.  Slight tenderness to posterior pressure of patella.  Brief Hospital Course:  Mary Walters is an 85 year old female with PMH of HTN, HLD, T2DM, CKD 3, Vascular Dementia, Hx of Stroke, and Hx of falls who presented to the ED due to weakness, and was found to have a UTI.  Falls initially thought to be secondary to UTI given UA with many bacteria and leukocytosis however weakness persisted despite antibiotic treatment and culture only showing 40 K colonies of lactobacillus.  Bilateral hip x-rays obtained due to patient refusing to lay her right leg actively although denied pain, hip x-rays negative for acute finding.  Bilateral lower extremities continued to be tender to touch with passive range of motion.  Given these findings on exam with elevated alk phos and history of multiple falls there was a concern  for possible metastatic cancer.  Cervical, thoracic, lumbar spine MRIs were obtained without evidence of bony metastasis although did show some neural foraminal stenosis in the lumbar spine which may be contributing to her weakness and pain.  Physical therapy evaluated this admission with decreased strength and balance requiring maximum assist.  Could consider outpatient neurosurgery referral for possible epidural injections given she likely would not be a good surgical candidate.  Chronic conditions remained stable throughout hospitalization with no changes to home medications.  Issues for Follow Up:  1. Please consider getting SPEP/UPEP labs for possible work-up for MGUS vs MM.  Should probably be discussed with the family regarding how much work-up they would like to proceed.  Peripheral smear drawn during admission however results not available at discharge. 2. Ambulatory referral placed to palliative care to determine goals of care. 3. Patient needs repeat CBC with differential and C met.  Significant Procedures: None  Significant Labs and Imaging:  Recent Labs  Lab 04/10/18 1501 04/11/18 0408 04/13/18 0714  WBC 16.5* 15.1* 10.8*  HGB 11.9* 11.4* 11.5*  HCT 38.0 35.1* 36.9  PLT 252 210 245   Recent Labs  Lab 04/10/18 1501 04/11/18 0408 04/12/18 0444 04/13/18 0714  NA 136 139 139 138  K 4.3 3.6 3.6 3.8  CL 100 103 108 105  CO2 _0 GLUCOSE 277* 219* 223* 245*  BUN 23 26* 34* 29*  CREATININE 1.05* 1.01* 1.24* 0.99  CALCIUM 9.9 9.5 9.5 9.6  ALKPHOS 542*  --  422* 424*  AST 40  --  35 34  ALT 43  --  35 34  ALBUMIN 3.1*  --  2.4* 2.3*   Urinalysis    Component Value Date/Time   COLORURINE  AMBER (A) 04/10/2018 1630   APPEARANCEUR HAZY (A) 04/10/2018 1630   LABSPEC 1.024 04/10/2018 1630   PHURINE 5.0 04/10/2018 1630   GLUCOSEU >=500 (A) 04/10/2018 1630   HGBUR SMALL (A) 04/10/2018 1630   BILIRUBINUR NEGATIVE 04/10/2018 1630   KETONESUR 5 (A) 04/10/2018 1630    PROTEINUR 100 (A) 04/10/2018 1630   NITRITE NEGATIVE 04/10/2018 1630   LEUKOCYTESUR NEGATIVE 04/10/2018 1630   CBC    Component Value Date/Time   WBC 10.8 (H) 04/13/2018 0714   RBC 4.51 04/13/2018 0714   HGB 11.5 (L) 04/13/2018 0714   HCT 36.9 04/13/2018 0714   PLT 245 04/13/2018 0714   MCV 81.8 04/13/2018 0714   MCH 25.5 (L) 04/13/2018 0714   MCHC 31.2 04/13/2018 0714   RDW 14.4 04/13/2018 0714   LYMPHSABS 1.3 04/13/2018 0714   MONOABS 1.2 (H) 04/13/2018 0714   EOSABS 0.0 04/13/2018 0714   BASOSABS 0.0 04/13/2018 0714   Dg Chest 2 View  Result Date: 04/11/2018 CLINICAL DATA:  Leukocytosis. EXAM: CHEST - 2 VIEW COMPARISON:  Radiograph yesterday. FINDINGS: Low lung volumes persist. Unchanged heart size and mediastinal contours. Mild elevation of right hemidiaphragm on the lateral view. Chronic blunting of left costophrenic angle. No acute airspace disease, pulmonary edema, pleural effusion or pneumothorax. IMPRESSION: Low lung volumes without acute abnormality. Electronically Signed   By: Keith Rake M.D.   On: 04/11/2018 19:31   Ct Head Wo Contrast  Result Date: 04/10/2018 CLINICAL DATA:  Difficulty walking. EXAM: CT HEAD WITHOUT CONTRAST TECHNIQUE: Contiguous axial images were obtained from the base of the skull through the vertex without intravenous contrast. COMPARISON:  None. FINDINGS: Brain: Mild diffuse cortical atrophy is noted. Moderate chronic ischemic white matter disease is noted. No mass effect or midline shift is noted. Ventricular size is within normal limits. There is no evidence of mass lesion, hemorrhage or acute infarction. Vascular: No hyperdense vessel or unexpected calcification. Skull: Normal. Negative for fracture or focal lesion. Sinuses/Orbits: No acute finding. Other: None. IMPRESSION: Mild diffuse cortical atrophy. Moderate chronic ischemic white matter disease. No acute intracranial abnormality seen. Electronically Signed   By: Marijo Conception, M.D.   On:  04/10/2018 15:50   [name]  Result Date: 04/10/2018 CLINICAL DATA:  Difficulty walking.  Leading to the left. EXAM: MRI HEAD WITHOUT CONTRAST TECHNIQUE: Multiplanar, multiecho pulse sequences of the brain and surrounding structures were obtained without intravenous contrast. COMPARISON:  Head CT 04/10/2018 FINDINGS: Some sequences are moderately to severely motion degraded. Axial T1 and coronal T2 sequences were not obtained. Brain: There is no evidence of acute infarct, intracranial hemorrhage, mass, midline shift, or extra-axial fluid collection. There is moderate cerebral atrophy. Confluent T2 hyperintensities in the cerebral white matter and patchy T2 hyperintensities in the pons are nonspecific but compatible with severe chronic small vessel ischemic disease. Chronic small vessel ischemic changes are also present in the deep gray nuclei bilaterally, and there is a chronic lacunar infarct in the pons on the left. A partially empty sella is noted. Vascular: Major intracranial vascular flow voids are preserved. Skull and upper cervical spine: Unremarkable bone marrow signal. Sinuses/Orbits: Unremarkable orbits. Minimal left maxillary sinus mucosal thickening. Grossly clear mastoid air cells. Other: None. IMPRESSION: 1. Motion degraded, incomplete examination. 2. No acute infarct. 3. Severe chronic small vessel ischemic disease. Electronically Signed   By: Logan Bores M.D.   On: 04/10/2018 17:50   Dg Chest Portable 1 View  Result Date: 04/10/2018 CLINICAL DATA:  left  side weakness for unknown amount of time. No known heart or lung surgeries. Found today Leaning towards her left side and not being able to walk ; pt alert and oriented x 2 and according to family members this is her baseline,pt usually able to walk ; pt alert and following commands ; last time she was seen normal was on Wednesday ; hx of left sided weakness from previous stroke but according to family that resolved" EXAM:  PORTABLE CHEST - 1 VIEW COMPARISON:  none FINDINGS: Low lung volumes. No definite infiltrate or overt edema. Heart size upper limits normal for technique. Blunting of left lateral costophrenic angle. No pneumothorax. Visualized bones unremarkable. IMPRESSION: Low volumes. No acute cardiopulmonary disease. Electronically Signed   By: Lucrezia Europe M.D.   On: 04/10/2018 15:40   Dg Hips Bilat With Pelvis 3-4 Views  Result Date: 04/11/2018 CLINICAL DATA:  Pain.  No trauma history submitted. EXAM: DG HIP (WITH OR WITHOUT PELVIS) 3-4V BILAT COMPARISON:  None. FINDINGS: AP view the pelvis and AP/frog leg views of both hips. Femoral heads are located. Vascular calcifications. Mild convex left lower lumbar spine curvature. Joint spaces are maintained for age. No acute fracture. Degenerate changes involve the right sacroiliac joint. IMPRESSION: No acute osseous abnormality. Electronically Signed   By: Abigail Miyamoto M.D.   On: 04/11/2018 14:52   US Abdomen Limited Ruq  Result Date: 04/11/2018 CLINICAL DATA:  Leukocytosis.  Falls. EXAM: ULTRASOUND ABDOMEN LIMITED RIGHT UPPER QUADRANT COMPARISON:  None. FINDINGS: Gallbladder: Polypoid sludge versus less likely true mass/polyp of the gallbladder measuring 2.8 by 1.5 by 1.8 cm. Doppler assessment did not demonstrate internal flow within this lesion, which tends to favor sludge over malignancy. Otherwise the gallbladder is unremarkable. Common bile duct: Diameter: 9 mm in diameter common no directly visualized choledocholithiasis. Liver: Several benign hepatic cysts are present. No focal solid lesion or worrisome hepatic lesion. Portal vein is patent on color Doppler imaging with normal direction of blood flow towards the liver. Other: Incidental visualization of simple appearing right renal cysts. Today's exam was not a dedicated renal ultrasound. IMPRESSION: 1. Polypoid lesion in the gallbladder does not have internal blood flow detected and is thus thought to most likely  represent tumefactive sludge rather than a mass. 2. Several hepatic and right renal cysts are present with benign sonographic characteristics. Common bile duct 9 mm in diameter, borderline prominent for age. Electronically Signed   By: Van Clines M.D.   On: 04/11/2018 20:02   Results/Tests Pending at Time of Discharge: Peripheral smear  Discharge Medications:  Allergies as of 04/14/2018   No Known Allergies     Medication List    TAKE these medications   aspirin EC 81 MG tablet Take 81 mg by mouth daily.   atorvastatin 40 MG tablet Commonly known as:  LIPITOR Take 40 mg by mouth daily.   candesartan 4 MG tablet Commonly known as:  ATACAND Take 4 mg by mouth daily.   Vitamin D 50 MCG (2000 UT) tablet Take 2,000 Units by mouth daily.       Discharge Instructions: Please refer to Patient Instructions section of EMR for full details.  Patient was counseled important signs and symptoms that should prompt return to medical care, changes in medications, dietary instructions, activity restrictions, and follow up appointments.   Follow-Up Appointments: Contact information for after-discharge care    Destination    [facility] Preferred SNF .   Service:  Skilled Nursing Contact information: [address]  [facility] [address] [place], [place], DO 04/14/2018, 12:40 PM PGY-2, [place]

## 2018-04-13 NOTE — Progress Notes (Signed)
Spoke with primary nurse regarding need for PIV. Pt currently not receiving any PIV medications. RN agrees okay to hold on PIV placement and will consult if access needed.

## 2018-04-13 NOTE — Progress Notes (Signed)
Physical Therapy Treatment Patient Details Name: Mary Walters MRN: [id number] DOB: [birth year] Today's Date: 04/13/2018    History of Present Illness Pt is an 85 y/o female admitted secondary to weakness and inability to walk. MRI of the brain was negative for any acute findings. Further work-up pending. No PMH in pt's chart.    PT Comments    Pt is making slow progress towards her goals today, however continues to be limited in safe mobility by increase pain especially in R LE, as well as decreased strength and balance Pt requires max Ax2 for sitting EoB and becomes total A once pain encountered. Pt able to sit EoB for approximately 5 min with max to total A required. Pt requires SNF level rehab to improve mobility prior to going home. PT will continue to follow acutely.    Follow Up Recommendations  SNF     Equipment Recommendations  None recommended by PT       Precautions / Restrictions Precautions Precautions: Fall Restrictions Weight Bearing Restrictions: No    Mobility  Bed Mobility Overal bed mobility: Needs Assistance Bed Mobility: Rolling;Supine to Sit;Sit to Supine Rolling: Total assist;+2 for physical assistance;+2 for safety/equipment   Supine to sit: Max assist;Total assist;+2 for physical assistance;+2 for safety/equipment Sit to supine: Total assist;+2 for physical assistance;+2 for safety/equipment   General bed mobility comments: pt with significant pain in RLE with any mobility attempts, resists certain movements; requires assist for LEs over EOB and to elevate trunk - pt with strong posterior lean due to pain with increased hip flexion requiring increased assist to maintain sitting balance, pt tending to maintain wt bearing through L elbow as well to offset wt in R side; totalA to roll towards L/R to change bed pads end of session due to incontinence   Transfers                 General transfer comment: unable        Balance Overall balance  assessment: Needs assistance Sitting-balance support: Feet supported;Single extremity supported Sitting balance-Leahy Scale: Poor Sitting balance - Comments: max-totalA for sitting balance, strong posterior lean due to RLE pain Postural control: Posterior lean                                  Cognition Arousal/Alertness: Awake/alert Behavior During Therapy: Flat affect Overall Cognitive Status: No family/caregiver present to determine baseline cognitive functioning Area of Impairment: Attention;Memory;Following commands;Safety/judgement;Awareness;Problem solving;Orientation                 Orientation Level: Disoriented to;Situation Current Attention Level: Sustained Memory: Decreased short-term memory Following Commands: Follows one step commands inconsistently;Follows one step commands with increased time Safety/Judgement: Decreased awareness of deficits;Decreased awareness of safety Awareness: Intellectual Problem Solving: Slow processing;Decreased initiation;Difficulty sequencing;Requires verbal cues;Requires tactile cues General Comments: pt with baseline cognitive impairments; able to follow commands given increased time and cues to do so          General Comments General comments (skin integrity, edema, etc.): VSS      Pertinent Vitals/Pain Pain Assessment: Faces Faces Pain Scale: Hurts whole lot Pain Location: with all mobility Pain Descriptors / Indicators: Grimacing;Guarding;Moaning;Crying Pain Intervention(s): Limited activity within patient's tolerance;Monitored during session;Repositioned    Home Living Family/patient expects to be discharged to:: Private residence Living Arrangements: Children Available Help at Discharge: Family Type of Home: House Home Access: Stairs to enter   Home Layout: One level  Home Equipment: Wheelchair - manual;Shower seat Additional Comments: unsure of pt accuracy with PLOF; pt reports her son lives with her,  but then later reports he lives nearby, often only responding to yes/no questions in relation to home setup     Prior Function Level of Independence: Independent          PT Goals (current goals can now be found in the care plan section) Acute Rehab PT Goals Patient Stated Goal: less pain PT Goal Formulation: Patient unable to participate in goal setting Time For Goal Achievement: 04/25/18 Potential to Achieve Goals: Fair Progress towards PT goals: Progressing toward goals    Frequency    Min 2X/week      PT Plan Current plan remains appropriate    Co-evaluation PT/OT/SLP Co-Evaluation/Treatment: Yes Reason for Co-Treatment: Complexity of the patient's impairments (multi-system involvement) PT goals addressed during session: Mobility/safety with mobility OT goals addressed during session: ADL's and self-care;Strengthening/ROM      AM-PAC PT "6 Clicks" Mobility   Outcome Measure  Help needed turning from your back to your side while in a flat bed without using bedrails?: Total Help needed moving from lying on your back to sitting on the side of a flat bed without using bedrails?: Total Help needed moving to and from a bed to a chair (including a wheelchair)?: Total Help needed standing up from a chair using your arms (e.g., wheelchair or bedside chair)?: Total Help needed to walk in hospital room?: Total Help needed climbing 3-5 steps with a railing? : Total 6 Click Score: 6    End of Session   Activity Tolerance: Patient limited by pain Patient left: in bed;with call bell/phone within reach;with bed alarm set Nurse Communication: Mobility status PT Visit Diagnosis: Other abnormalities of gait and mobility (R26.89);Muscle weakness (generalized) (M62.81);Difficulty in walking, not elsewhere classified (R26.2);Pain Pain - Right/Left: Right Pain - part of body: Hip;Leg     Time: 1141-1210 PT Time Calculation (min) (ACUTE ONLY): 29 min  Charges:  $Therapeutic  Activity: 8-22 mins                     Lieutenant Abarca B. Beverely Risen PT, DPT Acute Rehabilitation Services Pager [phone] Office [phone]    Elon Alas Fleet 04/13/2018, 4:06 PM

## 2018-04-13 NOTE — Progress Notes (Signed)
Family Medicine Teaching Service Daily Progress Note Intern Pager: [phone]  Patient name: Mary Walters Medical record number: [id number] Date of birth: Sep 08, 1928 Age: 85 y.o. Gender: female  Primary Care Provider: Daisy Floro, DO Consultants: None Code Status: Full  Patient's other MRN: [id number]  Pt Overview and Major Events to Date:  Mary Walters is a 85y/o female with PMH of HTN, HLD, T2DM, CKD 3, Vascular Dementia, Hx of Stroke and Hx of falls who presented to the ED due to weakness found to have a UTI.  Assessment and Plan: Mary Walters is an 85 year old female with PMH of HTN, HLD, T2DM, CKD 3, Vascular Dementia, Hx of Stroke, and Hx of falls who presented to the ED due to weakness, and was found to have a UTI.  Weakness of bilateral lower extremities, acute on chronic: WBC 15.1 > 10.8 this AM.  Patient's bilateral lower extremities are very tender to touch with passive range of motion.  PT has recommended SNF. Urine Cx growing 40K Colonies Lactobacillus.  -Stop Abx -Orthostatic vitals -PT/OT eval and treat -consider palliative consult for GOC  Elevated Alk Phos: 542 on admission, only slightly better at 424. Given age and history of multiple falls there is a concern that possible metastatic cancer is at the root of her instability and falls. RUQ U/S with sludge, benign appearing hepatic cysts.  Imaging negative for metastases. -Follow-up with family/palliative consult regarding how much the family wants to pursue this work-up  HTN, chronic: 189/93 this AM 03/30. On home candesartan 62m.  Goal 160/90. -75 mg Irbesartan substitute for Candesartan -Amlodipine 52m-Discont Metoprolol 2570mID  DM, chronic: Typically diet controlled and given age she does not need tight glucose control. A1c 7.2. Not on any home medications -mSSI -Lantus 5 units daily  HLD, chronic, stable: On home Atorvastatin 44m64mtorvastatin 44mg16mnsider stopping atorvastatin  CKD 3,  stable: 1.24 improved at 0.99. BL appears to be 1.00-1.20.  - Daily BMP  Hx of vascular dementia - Cont to monitor for changes in mental status - Consider head CT if AMS worsens  Low Vit D, chronic: Last Vit D 13.6 in 08/25/2017. - Replete as needed  FEN/GI: carb modified diet PPx: Lovenox  Disposition: stable  Subjective:  Patient seen resting in bed on her birthday!  She is pleasantly demented.  She states it is painful if she has to move any part of her lower extremities.   Objective: Temp:  [97.7 F (36.5 C)-99.6 F (37.6 C)] 98.4 F (36.9 C) (03/30 0450) Pulse Rate:  [98-108] 98 (03/30 0450) Resp:  [18-20] 18 (03/30 0450) BP: (161-189)/(75-93) 189/93 (03/30 0450) SpO2:  [94 %-99 %] 96 % (03/30 0450) Physical Exam:  Gen: Alert, no apparent distress, sleepy HEENT: Normocephalic, atraumatic CV: RRR, 2/6 systolic murmur, no rubs or gallops Resp: CTA bilaterally, no respiratory distress Abd: non-distended, nontender to palpation, soft, bowel sounds in 4 quadrants MSK: 4/5 strength of LLE, 3/5 strength of RLE, patient is expressing pain with passive movement of her extremities Ext: No clubbing, cyanosis, edema, or evidence of DVT Neuro: Motor strength 3/5 in bilateral upper extremities  Laboratory: Recent Labs  Lab 04/10/18 1501 04/11/18 0408 04/13/18 0714  WBC 16.5* 15.1* 10.8*  HGB 11.9* 11.4* 11.5*  HCT 38.0 35.1* 36.9  PLT 252 210 245   Recent Labs  Lab 04/10/18 1501 04/11/18 0408 04/12/18 0444  NA 136 139 139  K 4.3 3.6 3.6  CL 100 103 108  CO2 25 23 23  BUN 23 26* 34*  CREATININE 1.05* 1.01* 1.24*  CALCIUM 9.9 9.5 9.5  PROT 7.8  --  6.8  BILITOT 1.5*  --  0.7  ALKPHOS 542*  --  422*  ALT 43  --  35  AST 40  --  35  GLUCOSE 277* 219* 223*   Imaging/Diagnostic Tests: No new imaging.  Daisy Floro, DO 04/13/2018, 8:22 AM PGY-1, [place] Intern pager: [phone], text pages welcome

## 2018-04-14 ENCOUNTER — Inpatient Hospital Stay (HOSPITAL_COMMUNITY): Payer: Medicare HMO

## 2018-04-14 DIAGNOSIS — I69398 Other sequelae of cerebral infarction: Secondary | ICD-10-CM | POA: Diagnosis not present

## 2018-04-14 DIAGNOSIS — E559 Vitamin D deficiency, unspecified: Secondary | ICD-10-CM | POA: Diagnosis not present

## 2018-04-14 DIAGNOSIS — M25551 Pain in right hip: Secondary | ICD-10-CM | POA: Diagnosis not present

## 2018-04-14 DIAGNOSIS — K089 Disorder of teeth and supporting structures, unspecified: Secondary | ICD-10-CM | POA: Diagnosis not present

## 2018-04-14 DIAGNOSIS — D72825 Bandemia: Secondary | ICD-10-CM | POA: Diagnosis not present

## 2018-04-14 DIAGNOSIS — E860 Dehydration: Secondary | ICD-10-CM | POA: Diagnosis not present

## 2018-04-14 DIAGNOSIS — R410 Disorientation, unspecified: Secondary | ICD-10-CM | POA: Diagnosis not present

## 2018-04-14 DIAGNOSIS — R296 Repeated falls: Secondary | ICD-10-CM | POA: Diagnosis not present

## 2018-04-14 DIAGNOSIS — R32 Unspecified urinary incontinence: Secondary | ICD-10-CM | POA: Diagnosis not present

## 2018-04-14 DIAGNOSIS — N3000 Acute cystitis without hematuria: Secondary | ICD-10-CM | POA: Diagnosis not present

## 2018-04-14 DIAGNOSIS — R52 Pain, unspecified: Secondary | ICD-10-CM | POA: Diagnosis not present

## 2018-04-14 DIAGNOSIS — R29898 Other symptoms and signs involving the musculoskeletal system: Secondary | ICD-10-CM | POA: Diagnosis not present

## 2018-04-14 DIAGNOSIS — R402411 Glasgow coma scale score 13-15, in the field [EMT or ambulance]: Secondary | ICD-10-CM | POA: Diagnosis not present

## 2018-04-14 DIAGNOSIS — M6281 Muscle weakness (generalized): Secondary | ICD-10-CM | POA: Diagnosis not present

## 2018-04-14 DIAGNOSIS — N390 Urinary tract infection, site not specified: Secondary | ICD-10-CM | POA: Diagnosis not present

## 2018-04-14 DIAGNOSIS — R739 Hyperglycemia, unspecified: Secondary | ICD-10-CM | POA: Diagnosis not present

## 2018-04-14 DIAGNOSIS — M47812 Spondylosis without myelopathy or radiculopathy, cervical region: Secondary | ICD-10-CM | POA: Diagnosis not present

## 2018-04-14 DIAGNOSIS — M2550 Pain in unspecified joint: Secondary | ICD-10-CM | POA: Diagnosis not present

## 2018-04-14 DIAGNOSIS — F0390 Unspecified dementia without behavioral disturbance: Secondary | ICD-10-CM

## 2018-04-14 DIAGNOSIS — Q845 Enlarged and hypertrophic nails: Secondary | ICD-10-CM | POA: Diagnosis not present

## 2018-04-14 DIAGNOSIS — R69 Illness, unspecified: Secondary | ICD-10-CM | POA: Diagnosis not present

## 2018-04-14 DIAGNOSIS — W19XXXA Unspecified fall, initial encounter: Secondary | ICD-10-CM | POA: Diagnosis not present

## 2018-04-14 DIAGNOSIS — D72829 Elevated white blood cell count, unspecified: Secondary | ICD-10-CM | POA: Diagnosis not present

## 2018-04-14 DIAGNOSIS — I10 Essential (primary) hypertension: Secondary | ICD-10-CM | POA: Diagnosis not present

## 2018-04-14 DIAGNOSIS — Z9181 History of falling: Secondary | ICD-10-CM | POA: Diagnosis not present

## 2018-04-14 DIAGNOSIS — R262 Difficulty in walking, not elsewhere classified: Secondary | ICD-10-CM | POA: Diagnosis not present

## 2018-04-14 DIAGNOSIS — Z7401 Bed confinement status: Secondary | ICD-10-CM | POA: Diagnosis not present

## 2018-04-14 DIAGNOSIS — E119 Type 2 diabetes mellitus without complications: Secondary | ICD-10-CM | POA: Diagnosis not present

## 2018-04-14 DIAGNOSIS — L659 Nonscarring hair loss, unspecified: Secondary | ICD-10-CM | POA: Diagnosis not present

## 2018-04-14 DIAGNOSIS — E785 Hyperlipidemia, unspecified: Secondary | ICD-10-CM | POA: Diagnosis not present

## 2018-04-14 DIAGNOSIS — M48061 Spinal stenosis, lumbar region without neurogenic claudication: Secondary | ICD-10-CM | POA: Diagnosis not present

## 2018-04-14 DIAGNOSIS — R41841 Cognitive communication deficit: Secondary | ICD-10-CM | POA: Diagnosis not present

## 2018-04-14 LAB — PATHOLOGIST SMEAR REVIEW

## 2018-04-14 LAB — GLUCOSE, CAPILLARY: GLUCOSE-CAPILLARY: 150 mg/dL — AB (ref 70–99)

## 2018-04-14 NOTE — Progress Notes (Signed)
Physical Therapy Treatment Patient Details Name: Mary Walters MRN: [id number] DOB: [birth year] Today's Date: 04/14/2018    History of Present Illness Pt is an 85 y/o female admitted secondary to weakness and inability to walk. MRI of the brain was negative for any acute findings. Further work-up pending. No PMH in pt's chart.    PT Comments    Pt progression limited due to posterior push against facilitory movements. Pt limited due to pain in R leg.  Sat edge of bed with posterior lean and slowly pushing to edge of bed requiring return back to supine.  Pt able to perform hip and knee flexion in supine but kicks into extension with change in position.  Plan for [facility] remains appropriate.      Follow Up Recommendations  SNF     Equipment Recommendations  None recommended by PT    Recommendations for Other Services       Precautions / Restrictions Precautions Precautions: Fall Restrictions Weight Bearing Restrictions: No    Mobility  Bed Mobility Overal bed mobility: Needs Assistance Bed Mobility: Rolling;Supine to Sit;Sit to Supine Rolling: Total assist;+2 for physical assistance;+2 for safety/equipment   Supine to sit: Max assist;Total assist;+2 for physical assistance;+2 for safety/equipment Sit to supine: Total assist;+2 for physical assistance;+2 for safety/equipment   General bed mobility comments: pt with significant pain in RLE with any mobility attempts, resists certain movements; requires assist for LEs over EOB and to elevate trunk - pt with strong posterior lean due to pain with increased hip extension requiring increased assist to maintain sitting balance, Pt pushes posterior and required return back to bed.    Transfers                    Ambulation/Gait                 Stairs             Wheelchair Mobility    Modified Rankin (Stroke Patients Only)       Balance                                             Cognition Arousal/Alertness: Awake/alert Behavior During Therapy: Flat affect Overall Cognitive Status: No family/caregiver present to determine baseline cognitive functioning Area of Impairment: Attention;Memory;Following commands;Safety/judgement;Awareness;Problem solving;Orientation                 Orientation Level: Disoriented to;Situation Current Attention Level: Sustained Memory: Decreased short-term memory Following Commands: Follows one step commands inconsistently;Follows one step commands with increased time Safety/Judgement: Decreased awareness of deficits;Decreased awareness of safety Awareness: Intellectual Problem Solving: Slow processing;Decreased initiation;Difficulty sequencing;Requires verbal cues;Requires tactile cues General Comments: pt with baseline cognitive impairments; able to follow commands given increased time and cues to do so       Exercises General Exercises - Lower Extremity Heel Slides: Both;10 reps;AAROM;Supine;Limitations Heel Slides Limitations: R knee pain    General Comments        Pertinent Vitals/Pain Pain Assessment: Faces Faces Pain Scale: Hurts whole lot Pain Location: R knee Pain Descriptors / Indicators: Grimacing;Guarding;Moaning Pain Intervention(s): Monitored during session;Repositioned    Home Living                      Prior Function            PT Goals (current goals can  now be found in the care plan section) Acute Rehab PT Goals Patient Stated Goal: less pain PT Goal Formulation: Patient unable to participate in goal setting Potential to Achieve Goals: Fair Progress towards PT goals: Progressing toward goals    Frequency    Min 2X/week      PT Plan Current plan remains appropriate    Co-evaluation              AM-PAC PT "6 Clicks" Mobility   Outcome Measure  Help needed turning from your back to your side while in a flat bed without using bedrails?: Total Help needed moving from  lying on your back to sitting on the side of a flat bed without using bedrails?: Total Help needed moving to and from a bed to a chair (including a wheelchair)?: Total Help needed standing up from a chair using your arms (e.g., wheelchair or bedside chair)?: Total Help needed to walk in hospital room?: Total Help needed climbing 3-5 steps with a railing? : Total 6 Click Score: 6    End of Session Equipment Utilized During Treatment: Gait belt Activity Tolerance: Patient limited by pain Patient left: in bed;with call bell/phone within reach;with bed alarm set Nurse Communication: Mobility status PT Visit Diagnosis: Other abnormalities of gait and mobility (R26.89);Muscle weakness (generalized) (M62.81);Difficulty in walking, not elsewhere classified (R26.2);Pain Pain - Right/Left: Right Pain - part of body: Hip;Leg     Time: 1594-7076 PT Time Calculation (min) (ACUTE ONLY): 19 min  Charges:  $Therapeutic Activity: 8-22 mins                     Joycelyn Rua, PTA Acute Rehabilitation Services Pager [phone] Office [phone]     Kienan Doublin Artis Delay 04/14/2018, 5:26 PM

## 2018-04-14 NOTE — Progress Notes (Signed)
Patient discharging to [facility] All belongings sent with patient  Nurse called report and spoke with Misty Stanley

## 2018-04-14 NOTE — TOC Transition Note (Signed)
Transition of Care [facility]) - CM/SW Discharge Note   Patient Details  Name: Mary Walters MRN: [id number] Date of Birth: [birth year]  Transition of Care [facility]) CM/SW Contact:  Gildardo Griffes, LCSW Phone Number: 04/14/2018, 12:55 PM   Clinical Narrative:     Patient will DC to:[facility] Anticipated DC date: 04/14/2018 Family notified:[facility] VF:[facility]  Per MD patient ready for DC to [facility] . RN, patient, patient's family, and facility notified of DC. Discharge Summary sent to facility. RN given number for report [phone]. DC packet on chart. Ambulance transport requested for patient.  CSW signing off.  [place], [place] [phone]   Final next level of care: Skilled Nursing Facility Barriers to Discharge: No Barriers Identified   Patient Goals and CMS Choice Patient states their goals for this hospitalization and ongoing recovery are:: to go to rehab then go home CMS Medicare.gov Compare Post Acute Care list provided to:: Patient Represenative (must comment) Choice offered to / list presented to : Adult Children([facility])  Discharge Placement PASRR number recieved: 04/13/18 Existing PASRR number confirmed : 04/13/18          Patient chooses bed at: [facility] Patient to be transferred to facility by: [facility] Name of family member notified: Gerre Pebbles Patient and family notified of of transfer: 04/14/18  Discharge Plan and Services In-house Referral: Clinical Social Work Discharge Planning Services: NA Post Acute Care Choice: Home Health          DME Arranged: N/A DME Agency: (Awaiting choice from family) HH Arranged: NA HH Agency: NA(Awaiting choice from family)   Social Determinants of Health (SDOH) Interventions     Readmission Risk Interventions Readmission Risk Prevention Plan 04/11/2018  Post Dischage Appt Complete  Medication Screening Complete  Transportation Screening Complete

## 2018-04-14 NOTE — Progress Notes (Addendum)
CSW consulted with patient [name] regarding SNF placement and gave bed offers of [address] or [facility].   Gerre Pebbles was given facility information and reports he will call CSW back with decision within the next hour.   Update: Son called CSW back chose [facility] semi private room, nurse notified. Patient can dc to SNF when medically ready with DC summary and order.  [place], [place] [phone]

## 2018-04-14 NOTE — Care Management Important Message (Signed)
Important Message  Patient Details  Name: Mary Walters MRN: [id number] Date of Birth: February 16, 1928   Medicare Important Message Given:  Yes    Zyra Parrillo 04/14/2018, 3:24 PM

## 2018-04-15 ENCOUNTER — Other Ambulatory Visit: Payer: Self-pay

## 2018-04-15 ENCOUNTER — Non-Acute Institutional Stay: Payer: Medicare HMO | Admitting: Internal Medicine

## 2018-04-15 DIAGNOSIS — R69 Illness, unspecified: Secondary | ICD-10-CM | POA: Diagnosis not present

## 2018-04-15 DIAGNOSIS — I10 Essential (primary) hypertension: Secondary | ICD-10-CM | POA: Diagnosis not present

## 2018-04-15 DIAGNOSIS — E119 Type 2 diabetes mellitus without complications: Secondary | ICD-10-CM | POA: Diagnosis not present

## 2018-04-15 DIAGNOSIS — M6281 Muscle weakness (generalized): Secondary | ICD-10-CM | POA: Diagnosis not present

## 2018-04-15 DIAGNOSIS — E785 Hyperlipidemia, unspecified: Secondary | ICD-10-CM | POA: Diagnosis not present

## 2018-04-15 DIAGNOSIS — K089 Disorder of teeth and supporting structures, unspecified: Secondary | ICD-10-CM | POA: Diagnosis not present

## 2018-04-15 DIAGNOSIS — M48061 Spinal stenosis, lumbar region without neurogenic claudication: Secondary | ICD-10-CM | POA: Diagnosis not present

## 2018-04-16 DIAGNOSIS — M6281 Muscle weakness (generalized): Secondary | ICD-10-CM | POA: Diagnosis not present

## 2018-04-16 DIAGNOSIS — I69398 Other sequelae of cerebral infarction: Secondary | ICD-10-CM | POA: Diagnosis not present

## 2018-04-16 DIAGNOSIS — R69 Illness, unspecified: Secondary | ICD-10-CM | POA: Diagnosis not present

## 2018-04-16 DIAGNOSIS — E785 Hyperlipidemia, unspecified: Secondary | ICD-10-CM | POA: Diagnosis not present

## 2018-04-16 DIAGNOSIS — I10 Essential (primary) hypertension: Secondary | ICD-10-CM | POA: Diagnosis not present

## 2018-04-16 DIAGNOSIS — E119 Type 2 diabetes mellitus without complications: Secondary | ICD-10-CM | POA: Diagnosis not present

## 2018-04-16 DIAGNOSIS — M48061 Spinal stenosis, lumbar region without neurogenic claudication: Secondary | ICD-10-CM | POA: Diagnosis not present

## 2018-04-20 DIAGNOSIS — I69398 Other sequelae of cerebral infarction: Secondary | ICD-10-CM | POA: Diagnosis not present

## 2018-04-20 DIAGNOSIS — W19XXXA Unspecified fall, initial encounter: Secondary | ICD-10-CM | POA: Diagnosis not present

## 2018-04-20 DIAGNOSIS — M6281 Muscle weakness (generalized): Secondary | ICD-10-CM | POA: Diagnosis not present

## 2018-04-20 DIAGNOSIS — R32 Unspecified urinary incontinence: Secondary | ICD-10-CM | POA: Diagnosis not present

## 2018-04-20 DIAGNOSIS — R739 Hyperglycemia, unspecified: Secondary | ICD-10-CM | POA: Diagnosis not present

## 2018-04-20 DIAGNOSIS — R69 Illness, unspecified: Secondary | ICD-10-CM | POA: Diagnosis not present

## 2018-04-20 DIAGNOSIS — I10 Essential (primary) hypertension: Secondary | ICD-10-CM | POA: Diagnosis not present

## 2018-04-20 DIAGNOSIS — E119 Type 2 diabetes mellitus without complications: Secondary | ICD-10-CM | POA: Diagnosis not present

## 2018-04-20 DIAGNOSIS — M48061 Spinal stenosis, lumbar region without neurogenic claudication: Secondary | ICD-10-CM | POA: Diagnosis not present

## 2018-04-20 DIAGNOSIS — E785 Hyperlipidemia, unspecified: Secondary | ICD-10-CM | POA: Diagnosis not present

## 2018-04-21 ENCOUNTER — Ambulatory Visit: Payer: Medicare HMO | Admitting: Podiatry

## 2018-04-24 DIAGNOSIS — R739 Hyperglycemia, unspecified: Secondary | ICD-10-CM | POA: Diagnosis not present

## 2018-04-24 DIAGNOSIS — R32 Unspecified urinary incontinence: Secondary | ICD-10-CM | POA: Diagnosis not present

## 2018-04-24 DIAGNOSIS — E785 Hyperlipidemia, unspecified: Secondary | ICD-10-CM | POA: Diagnosis not present

## 2018-04-24 DIAGNOSIS — M6281 Muscle weakness (generalized): Secondary | ICD-10-CM | POA: Diagnosis not present

## 2018-04-24 DIAGNOSIS — E119 Type 2 diabetes mellitus without complications: Secondary | ICD-10-CM | POA: Diagnosis not present

## 2018-04-24 DIAGNOSIS — M48061 Spinal stenosis, lumbar region without neurogenic claudication: Secondary | ICD-10-CM | POA: Diagnosis not present

## 2018-04-24 DIAGNOSIS — I69398 Other sequelae of cerebral infarction: Secondary | ICD-10-CM | POA: Diagnosis not present

## 2018-04-24 DIAGNOSIS — I10 Essential (primary) hypertension: Secondary | ICD-10-CM | POA: Diagnosis not present

## 2018-04-24 DIAGNOSIS — Q845 Enlarged and hypertrophic nails: Secondary | ICD-10-CM | POA: Diagnosis not present

## 2018-04-24 DIAGNOSIS — W19XXXA Unspecified fall, initial encounter: Secondary | ICD-10-CM | POA: Diagnosis not present

## 2018-04-27 DIAGNOSIS — I10 Essential (primary) hypertension: Secondary | ICD-10-CM | POA: Diagnosis not present

## 2018-04-27 DIAGNOSIS — R32 Unspecified urinary incontinence: Secondary | ICD-10-CM | POA: Diagnosis not present

## 2018-04-27 DIAGNOSIS — E119 Type 2 diabetes mellitus without complications: Secondary | ICD-10-CM | POA: Diagnosis not present

## 2018-04-27 DIAGNOSIS — Q845 Enlarged and hypertrophic nails: Secondary | ICD-10-CM | POA: Diagnosis not present

## 2018-04-27 DIAGNOSIS — E785 Hyperlipidemia, unspecified: Secondary | ICD-10-CM | POA: Diagnosis not present

## 2018-04-27 DIAGNOSIS — M6281 Muscle weakness (generalized): Secondary | ICD-10-CM | POA: Diagnosis not present

## 2018-04-27 DIAGNOSIS — I69398 Other sequelae of cerebral infarction: Secondary | ICD-10-CM | POA: Diagnosis not present

## 2018-04-27 DIAGNOSIS — R739 Hyperglycemia, unspecified: Secondary | ICD-10-CM | POA: Diagnosis not present

## 2018-04-27 DIAGNOSIS — W19XXXA Unspecified fall, initial encounter: Secondary | ICD-10-CM | POA: Diagnosis not present

## 2018-04-27 DIAGNOSIS — M48061 Spinal stenosis, lumbar region without neurogenic claudication: Secondary | ICD-10-CM | POA: Diagnosis not present

## 2018-04-29 DIAGNOSIS — I10 Essential (primary) hypertension: Secondary | ICD-10-CM | POA: Diagnosis not present

## 2018-04-29 DIAGNOSIS — R296 Repeated falls: Secondary | ICD-10-CM | POA: Diagnosis not present

## 2018-05-01 DIAGNOSIS — L659 Nonscarring hair loss, unspecified: Secondary | ICD-10-CM | POA: Diagnosis not present

## 2018-05-04 DIAGNOSIS — M6281 Muscle weakness (generalized): Secondary | ICD-10-CM | POA: Diagnosis not present

## 2018-05-04 DIAGNOSIS — R69 Illness, unspecified: Secondary | ICD-10-CM | POA: Diagnosis not present

## 2018-05-04 DIAGNOSIS — I10 Essential (primary) hypertension: Secondary | ICD-10-CM | POA: Diagnosis not present

## 2018-05-04 DIAGNOSIS — E785 Hyperlipidemia, unspecified: Secondary | ICD-10-CM | POA: Diagnosis not present

## 2018-05-04 DIAGNOSIS — E119 Type 2 diabetes mellitus without complications: Secondary | ICD-10-CM | POA: Diagnosis not present

## 2018-05-04 DIAGNOSIS — I69398 Other sequelae of cerebral infarction: Secondary | ICD-10-CM | POA: Diagnosis not present

## 2018-05-04 DIAGNOSIS — M48061 Spinal stenosis, lumbar region without neurogenic claudication: Secondary | ICD-10-CM | POA: Diagnosis not present

## 2018-05-13 DIAGNOSIS — E559 Vitamin D deficiency, unspecified: Secondary | ICD-10-CM | POA: Diagnosis not present

## 2018-05-13 DIAGNOSIS — G301 Alzheimer's disease with late onset: Secondary | ICD-10-CM | POA: Diagnosis not present

## 2018-05-13 DIAGNOSIS — M48061 Spinal stenosis, lumbar region without neurogenic claudication: Secondary | ICD-10-CM | POA: Diagnosis not present

## 2018-05-13 DIAGNOSIS — E1100 Type 2 diabetes mellitus with hyperosmolarity without nonketotic hyperglycemic-hyperosmolar coma (NKHHC): Secondary | ICD-10-CM | POA: Diagnosis not present

## 2018-05-13 DIAGNOSIS — M6281 Muscle weakness (generalized): Secondary | ICD-10-CM | POA: Diagnosis not present

## 2018-05-13 DIAGNOSIS — R296 Repeated falls: Secondary | ICD-10-CM | POA: Diagnosis not present

## 2018-05-13 DIAGNOSIS — I10 Essential (primary) hypertension: Secondary | ICD-10-CM | POA: Diagnosis not present

## 2018-05-13 DIAGNOSIS — E782 Mixed hyperlipidemia: Secondary | ICD-10-CM | POA: Diagnosis not present

## 2018-05-19 ENCOUNTER — Other Ambulatory Visit: Payer: Self-pay

## 2018-05-19 NOTE — Addendum Note (Signed)
Addended by: Steva Colder on: 05/19/2018 04:28 PM   Modules accepted: Orders

## 2018-05-21 MED ORDER — CANDESARTAN CILEXETIL 4 MG PO TABS: 4.0000 mg | ORAL_TABLET | Freq: Every day | ORAL | 0 refills | Status: DC

## 2018-06-10 DIAGNOSIS — E559 Vitamin D deficiency, unspecified: Secondary | ICD-10-CM | POA: Diagnosis not present

## 2018-06-10 DIAGNOSIS — E119 Type 2 diabetes mellitus without complications: Secondary | ICD-10-CM | POA: Diagnosis not present

## 2018-06-10 DIAGNOSIS — G301 Alzheimer's disease with late onset: Secondary | ICD-10-CM | POA: Diagnosis not present

## 2018-06-10 DIAGNOSIS — I10 Essential (primary) hypertension: Secondary | ICD-10-CM | POA: Diagnosis not present

## 2018-06-10 DIAGNOSIS — E782 Mixed hyperlipidemia: Secondary | ICD-10-CM | POA: Diagnosis not present

## 2018-06-11 ENCOUNTER — Telehealth: Payer: Self-pay | Admitting: Family Medicine

## 2018-06-11 DIAGNOSIS — F0150 Vascular dementia without behavioral disturbance: Secondary | ICD-10-CM

## 2018-06-11 DIAGNOSIS — R69 Illness, unspecified: Secondary | ICD-10-CM | POA: Diagnosis not present

## 2018-06-11 DIAGNOSIS — N183 Chronic kidney disease, stage 3 unspecified: Secondary | ICD-10-CM

## 2018-06-11 DIAGNOSIS — E1122 Type 2 diabetes mellitus with diabetic chronic kidney disease: Secondary | ICD-10-CM

## 2018-06-11 DIAGNOSIS — R296 Repeated falls: Secondary | ICD-10-CM

## 2018-06-11 MED ORDER — ONETOUCH VERIO FLEX SYSTEM W/DEVICE KIT: PACK | 3 refills | Status: DC

## 2018-06-11 MED ORDER — GLUCOSE BLOOD VI STRP: ORAL_STRIP | 12 refills | Status: DC

## 2018-06-11 MED ORDER — ONETOUCH DELICA LANCETS 33G MISC: 1.0000 [IU] | Freq: Every day | 1 refills | Status: DC | PRN

## 2018-06-11 NOTE — Telephone Encounter (Signed)
Spoke with Mr. Ormsby, Lashaun's son.  Patient discharged with Temazepam--- will stop this medication. Patient has an A1C of 7.2, also discharged on Lantus 10 u nightly  1. Discontinue lantus and temazepam 2. Hold Candesartan, will re-start pending BMP (this was stopped at SNF).  3. Check AM Blood glucoses daily for next 3-4 days. If <150, will not restart Lantus.   Rx for glucometer and supplies sent.  Home Health ordered to check BMP. All questions were answered---will call patient's son tomorrow to check BG.   Terisa Starr, MD  Family Medicine Teaching Service

## 2018-06-11 NOTE — Telephone Encounter (Signed)
[facility] Telephone Note  Patient recently returned home from skilled nursing facility. Received call from family they have questions about medications.  New medications from hospitalization - Temazepam (will discontinue)  - Basaglar- A1C was 7.2, will check AM sugar and likely discontinue   Left voicemail for contact number in chart. Will try to reach son later this AM.   Terisa Starr, MD  [facility] Medicine Teaching Service

## 2018-06-17 ENCOUNTER — Ambulatory Visit: Payer: Medicare HMO | Admitting: Podiatry

## 2018-06-17 ENCOUNTER — Telehealth: Payer: Self-pay

## 2018-06-17 DIAGNOSIS — F0150 Vascular dementia without behavioral disturbance: Secondary | ICD-10-CM

## 2018-06-17 DIAGNOSIS — R413 Other amnesia: Secondary | ICD-10-CM

## 2018-06-17 NOTE — Telephone Encounter (Signed)
Pt needs referral for therapist and nurse from [facility]. Mary Walters was the previous nurse that came out to see pt. [phone]). Please let Mary Walters know when Referral has been placed.[phone]  Mary Walters, CMA

## 2018-06-18 NOTE — Telephone Encounter (Signed)
I have not placed home health orders to begin from the outpatient setting before. I placed them now. Please let family know to call us if they haven't heard from anyone by Monday or Tuesday, we may need to see what work queue this enters (referrals?) if so.

## 2018-06-18 NOTE — Telephone Encounter (Signed)
Contacted pt daughter-in-law and she said that someone reached out to them today and will contact them this afternoon and let them know when someone will be coming out. April Zimmerman Rumple, CMA

## 2018-06-18 NOTE — Addendum Note (Signed)
Addended by: Shon Hale on: 06/18/2018 03:37 PM   Modules accepted: Orders

## 2018-06-19 DIAGNOSIS — Z87440 Personal history of urinary (tract) infections: Secondary | ICD-10-CM | POA: Diagnosis not present

## 2018-06-19 DIAGNOSIS — I129 Hypertensive chronic kidney disease with stage 1 through stage 4 chronic kidney disease, or unspecified chronic kidney disease: Secondary | ICD-10-CM | POA: Diagnosis not present

## 2018-06-19 DIAGNOSIS — N183 Chronic kidney disease, stage 3 (moderate): Secondary | ICD-10-CM | POA: Diagnosis not present

## 2018-06-19 DIAGNOSIS — N390 Urinary tract infection, site not specified: Secondary | ICD-10-CM | POA: Diagnosis not present

## 2018-06-19 DIAGNOSIS — Z9181 History of falling: Secondary | ICD-10-CM | POA: Diagnosis not present

## 2018-06-19 DIAGNOSIS — Z85830 Personal history of malignant neoplasm of bone: Secondary | ICD-10-CM | POA: Diagnosis not present

## 2018-06-19 DIAGNOSIS — M48061 Spinal stenosis, lumbar region without neurogenic claudication: Secondary | ICD-10-CM | POA: Diagnosis not present

## 2018-06-19 DIAGNOSIS — E1122 Type 2 diabetes mellitus with diabetic chronic kidney disease: Secondary | ICD-10-CM | POA: Diagnosis not present

## 2018-06-19 DIAGNOSIS — E785 Hyperlipidemia, unspecified: Secondary | ICD-10-CM | POA: Diagnosis not present

## 2018-06-19 DIAGNOSIS — R69 Illness, unspecified: Secondary | ICD-10-CM | POA: Diagnosis not present

## 2018-06-19 DIAGNOSIS — I679 Cerebrovascular disease, unspecified: Secondary | ICD-10-CM | POA: Diagnosis not present

## 2018-06-22 ENCOUNTER — Telehealth: Payer: Self-pay

## 2018-06-22 DIAGNOSIS — E785 Hyperlipidemia, unspecified: Secondary | ICD-10-CM | POA: Diagnosis not present

## 2018-06-22 DIAGNOSIS — Z9181 History of falling: Secondary | ICD-10-CM | POA: Diagnosis not present

## 2018-06-22 DIAGNOSIS — R69 Illness, unspecified: Secondary | ICD-10-CM | POA: Diagnosis not present

## 2018-06-22 DIAGNOSIS — E1122 Type 2 diabetes mellitus with diabetic chronic kidney disease: Secondary | ICD-10-CM | POA: Diagnosis not present

## 2018-06-22 DIAGNOSIS — M48061 Spinal stenosis, lumbar region without neurogenic claudication: Secondary | ICD-10-CM | POA: Diagnosis not present

## 2018-06-22 DIAGNOSIS — Z85830 Personal history of malignant neoplasm of bone: Secondary | ICD-10-CM | POA: Diagnosis not present

## 2018-06-22 DIAGNOSIS — I679 Cerebrovascular disease, unspecified: Secondary | ICD-10-CM | POA: Diagnosis not present

## 2018-06-22 DIAGNOSIS — N183 Chronic kidney disease, stage 3 (moderate): Secondary | ICD-10-CM | POA: Diagnosis not present

## 2018-06-22 DIAGNOSIS — N390 Urinary tract infection, site not specified: Secondary | ICD-10-CM | POA: Diagnosis not present

## 2018-06-22 DIAGNOSIS — I129 Hypertensive chronic kidney disease with stage 1 through stage 4 chronic kidney disease, or unspecified chronic kidney disease: Secondary | ICD-10-CM | POA: Diagnosis not present

## 2018-06-22 NOTE — Telephone Encounter (Signed)
Called daughter in law. No answer and mailbox is full. If she calls, please schedule pt to see Dr. Lindell Noe or Dr. Owens Shark via a virtual visit if possible this week. See Dr. Rosario Jacks note below. Ottis Stain, CMA

## 2018-06-22 NOTE — Telephone Encounter (Signed)
Hall Busing, Nurse for pt calling to let Dr. Gwyndolyn Kaufman that pt BP was 160-100 in the rt arm and 174/101 in the left arm. She did ask caregiver to take pt BP everyday for a week so we can get a good idea of what her blood pressure is. Ilda Mori can be reached at [place], [place]

## 2018-06-22 NOTE — Telephone Encounter (Signed)
[facility], please have family make a virtual visit if at all possible this week with me or Dr. Owens Shark to discuss BP. If she has changes in mental status or concerns, she should seek care immediately. I would ask that PT take her blood pressure while standing as well. I have called Ciera and asked her this, she will do so and call us back.

## 2018-06-22 NOTE — Telephone Encounter (Signed)
Contacted Sunday Spillers and scheduled pt virtual visit per [place]. April Zimmerman Rumple, CMA

## 2018-06-23 ENCOUNTER — Ambulatory Visit (INDEPENDENT_AMBULATORY_CARE_PROVIDER_SITE_OTHER): Payer: Medicare HMO | Admitting: Family Medicine

## 2018-06-23 ENCOUNTER — Other Ambulatory Visit: Payer: Self-pay

## 2018-06-23 ENCOUNTER — Telehealth: Payer: Self-pay

## 2018-06-23 ENCOUNTER — Encounter: Payer: Self-pay | Admitting: Family Medicine

## 2018-06-23 VITALS — BP 152/80 | HR 74 | Wt 136.6 lb

## 2018-06-23 DIAGNOSIS — I10 Essential (primary) hypertension: Secondary | ICD-10-CM

## 2018-06-23 DIAGNOSIS — D72819 Decreased white blood cell count, unspecified: Secondary | ICD-10-CM

## 2018-06-23 DIAGNOSIS — R748 Abnormal levels of other serum enzymes: Secondary | ICD-10-CM | POA: Diagnosis not present

## 2018-06-23 DIAGNOSIS — E559 Vitamin D deficiency, unspecified: Secondary | ICD-10-CM | POA: Diagnosis not present

## 2018-06-23 NOTE — Progress Notes (Signed)
Patient Name: Mary Walters Date of Birth: [birth year] Date of Visit: 06/23/18 PCP: Sela Hilding, MD  Chief Complaint: elevated blood pressure at home   Subjective: Mary Walters is a pleasant 85 y.o. with medical history significant for hypertension, hyperlipidemia, cognitive decline secondary to vascular dementia presenting today for elevated blood pressure at home. She is joined by her daughter Sunday Spillers) and granddaughter. She is brought in by wheelchair today.   The patient returned home after spending 30 days at a skilled nursing facility.  The patient is living with her son, daughter-in-law and 2 granddaughters.  She is living in a first floor apartment.  She is overall doing quite well she reports.  She sleeps quite well at night without behavioral disturbances and does not wake up.  She is getting up to use the bedside commode on a regular basis.  She had her first physical therapy visit yesterday and was able to walk with them.  She does have some left leg pain at times although this is not severe.  She is eating well although her family reports that they do have to encourage her to eat.  She had a slight headache this morning while she was getting dressed but this has resolved entirely.  She has a history of mild headaches and does not have a headache currently.  She specifically denies dizziness upon standing, falls, chest pain difficulty breathing or skin breakdown.  She feels overall well and is pleased she is home.  Prior to entering the skilled nursing facility the patient was admitted to [facility] for confusion.  During her hospital stay her weakness was evaluated with an MRI of the brain, cervical spine, thoracic spine and lumbar spine.  This did show some spinal stenosis.  I discussed this with the family again today and they do not want to pursue any surgical intervention.  They would like to see her get back to walking some more and are pleased with the physical  therapy progress so far.  Additionally during the patient's hospital stay she was noted to have an elevated alkaline phosphatase.  She did have a low vitamin D which she has been taking a supplement for.  No fracture was identified during her hospital stay.  She did not have routine cancer screening during most of her adult life per her family.  No fevers.  She has had a 10 pound weight loss over the last year and a half. No cough, blood in stool, hematuria.   The patient has had a elevated blood pressure reading with her home cuff the past few days.  She specifically denies chest pain difficulty breathing.  Family denies weight gain or lower extremity edema.   ROS: As above ROS  I have reviewed the patient's medical, surgical, family, and social history as appropriate.   Vitals:   06/23/18 1348  BP: (!) 152/80  Pulse: 74  SpO2: 97%   Filed Weights   06/23/18 1348  Weight: 136 lb 9.6 oz (62 kg)   HEENT: Sclera anicteric. Dentition is poor. Appears well hydrated. Neck: Supple Cardiac: Regular rate and rhythm. Normal S1/S2. No murmurs, rubs, or gallops appreciated. Lungs: Clear bilaterally to ascultation.   Extremities: Hypertrophic thickened nails bilaterally.  No ulcers on feet.  Dorsal pulses are 1+.  No lower extremity edema Skin: Warm, dry Psych: Pleasant and appropriate she is able to converse appropriately but when asked details about her health care or her health status she is unable to provide the  specifics.  She is alert and oriented to person and place.    Hughie Clossdelaide was seen today for hypertension.  Diagnoses and all orders for this visit:  Elevated alkaline phosphatase level differential remains broad, this may be due to hypovitaminosis D although the value was quite elevated in March.  We will further evaluate.  She had a liver ultrasound as well as C, T and L-spine imaging which did not reveal any metastatic disease.  I discussed with the family today that malignancy  or cancer is on the differential for this.  This was previously discussed with the family during her March hospitalization. -     Comprehensive metabolic panel -     TSH -     Hepatitis c antibody (reflex) -     Gamma GT  Vitamin D deficiency -     Vitamin D, 25-hydroxy  Leukopenia, unspecified type noted on prior CBC -     CBC with Differential  Elevated blood pressure readings at home, recommended that the family check the blood pressure once a week with different cough.  Home health nurse could also do this.  Will monitor for symptoms of elevated blood pressure which I reviewed with them today  I recommend follow-up in 1 to 2 months.  Pending labs above will discuss referral to hematology for further evaluation versus focus on comfort measures.  They previously opted to focus primarily on comfort measures given her advancing memory loss.  Terisa Starrarina , MD  Family Medicine Teaching Service

## 2018-06-23 NOTE — Patient Instructions (Signed)
If you have pain you can take 1 to 2 tablets of Tylenol.  The maximal dose per day is 3000 mg.  Your blood work should be back tomorrow or the next day.  I will call you with results  Check your blood sugar once a week in the morning.  If it is over 200 please give [place] a call.  You do not need to check your blood pressure regularly.  Your blood pressure looks great today in the office.  It was wonderful to see you today.  Thank you for choosing [facility].   Please call [phone] with any questions about today's appointment.  Please be sure to schedule follow up at the front  desk before you leave today.   Dorris Singh, MD  Family Medicine

## 2018-06-23 NOTE — Telephone Encounter (Signed)
Mickel Baas, PT, called nurse line requesting VO for:  PT 2x a week for 6 weeks.   Jenetta Downer can be left on her VM [phone]

## 2018-06-24 ENCOUNTER — Telehealth: Payer: Self-pay | Admitting: Family Medicine

## 2018-06-24 ENCOUNTER — Telehealth: Payer: Medicare HMO

## 2018-06-24 DIAGNOSIS — I679 Cerebrovascular disease, unspecified: Secondary | ICD-10-CM | POA: Diagnosis not present

## 2018-06-24 DIAGNOSIS — F0150 Vascular dementia without behavioral disturbance: Secondary | ICD-10-CM

## 2018-06-24 DIAGNOSIS — R69 Illness, unspecified: Secondary | ICD-10-CM | POA: Diagnosis not present

## 2018-06-24 DIAGNOSIS — I129 Hypertensive chronic kidney disease with stage 1 through stage 4 chronic kidney disease, or unspecified chronic kidney disease: Secondary | ICD-10-CM | POA: Diagnosis not present

## 2018-06-24 DIAGNOSIS — N390 Urinary tract infection, site not specified: Secondary | ICD-10-CM | POA: Diagnosis not present

## 2018-06-24 DIAGNOSIS — R748 Abnormal levels of other serum enzymes: Secondary | ICD-10-CM

## 2018-06-24 DIAGNOSIS — E1122 Type 2 diabetes mellitus with diabetic chronic kidney disease: Secondary | ICD-10-CM | POA: Diagnosis not present

## 2018-06-24 DIAGNOSIS — M48061 Spinal stenosis, lumbar region without neurogenic claudication: Secondary | ICD-10-CM | POA: Diagnosis not present

## 2018-06-24 DIAGNOSIS — N183 Chronic kidney disease, stage 3 (moderate): Secondary | ICD-10-CM | POA: Diagnosis not present

## 2018-06-24 DIAGNOSIS — Z9181 History of falling: Secondary | ICD-10-CM | POA: Diagnosis not present

## 2018-06-24 DIAGNOSIS — E785 Hyperlipidemia, unspecified: Secondary | ICD-10-CM | POA: Diagnosis not present

## 2018-06-24 DIAGNOSIS — Z85830 Personal history of malignant neoplasm of bone: Secondary | ICD-10-CM | POA: Diagnosis not present

## 2018-06-24 LAB — VITAMIN D 25 HYDROXY (VIT D DEFICIENCY, FRACTURES): Vit D, 25-Hydroxy: 27.9 ng/mL — ABNORMAL LOW (ref 30.0–100.0)

## 2018-06-24 LAB — CBC WITH DIFFERENTIAL/PLATELET
Basophils Absolute: 0 10*3/uL (ref 0.0–0.2)
Basos: 0 %
EOS (ABSOLUTE): 0.1 10*3/uL (ref 0.0–0.4)
Eos: 2 %
Hematocrit: 34.7 % (ref 34.0–46.6)
Hemoglobin: 11.4 g/dL (ref 11.1–15.9)
Immature Grans (Abs): 0 10*3/uL (ref 0.0–0.1)
Immature Granulocytes: 0 %
Lymphocytes Absolute: 1.8 10*3/uL (ref 0.7–3.1)
Lymphs: 35 %
MCH: 25.9 pg — ABNORMAL LOW (ref 26.6–33.0)
MCHC: 32.9 g/dL (ref 31.5–35.7)
MCV: 79 fL (ref 79–97)
Monocytes Absolute: 0.3 10*3/uL (ref 0.1–0.9)
Monocytes: 6 %
Neutrophils Absolute: 2.8 10*3/uL (ref 1.4–7.0)
Neutrophils: 57 %
Platelets: 220 10*3/uL (ref 150–450)
RBC: 4.41 x10E6/uL (ref 3.77–5.28)
RDW: 14.7 % (ref 11.7–15.4)
WBC: 5 10*3/uL (ref 3.4–10.8)

## 2018-06-24 LAB — COMPREHENSIVE METABOLIC PANEL
ALT: 19 IU/L (ref 0–32)
AST: 26 IU/L (ref 0–40)
Albumin/Globulin Ratio: 1.2 (ref 1.2–2.2)
Albumin: 3.8 g/dL (ref 3.6–4.6)
Alkaline Phosphatase: 279 IU/L — ABNORMAL HIGH (ref 39–117)
BUN/Creatinine Ratio: 22 (ref 12–28)
BUN: 18 mg/dL (ref 8–27)
Bilirubin Total: 0.3 mg/dL (ref 0.0–1.2)
CO2: 23 mmol/L (ref 20–29)
Calcium: 9.6 mg/dL (ref 8.7–10.3)
Chloride: 103 mmol/L (ref 96–106)
Creatinine, Ser: 0.83 mg/dL (ref 0.57–1.00)
GFR calc Af Amer: 73 mL/min/{1.73_m2} (ref >59)
GFR calc non Af Amer: 63 mL/min/{1.73_m2} (ref >59)
Globulin, Total: 3.2 g/dL (ref 1.5–4.5)
Glucose: 184 mg/dL — ABNORMAL HIGH (ref 65–99)
Potassium: 4.1 mmol/L (ref 3.5–5.2)
Sodium: 142 mmol/L (ref 134–144)
Total Protein: 7 g/dL (ref 6.0–8.5)

## 2018-06-24 LAB — TSH: TSH: 1.15 u[IU]/mL (ref 0.450–4.500)

## 2018-06-24 LAB — GAMMA GT: GGT: 227 IU/L — ABNORMAL HIGH (ref 0–60)

## 2018-06-24 LAB — HCV COMMENT:

## 2018-06-24 LAB — HEPATITIS C ANTIBODY (REFLEX): HCV Ab: 0.1 s/co ratio (ref 0.0–0.9)

## 2018-06-24 MED ORDER — ATORVASTATIN CALCIUM 20 MG PO TABS: 20.0000 mg | ORAL_TABLET | Freq: Every day | ORAL | 3 refills | Status: DC

## 2018-06-24 NOTE — Telephone Encounter (Signed)
VO given to Mary Walters. Mary Walters, CMA

## 2018-06-24 NOTE — Telephone Encounter (Signed)
Ok to give verbal order.

## 2018-06-24 NOTE — Telephone Encounter (Signed)
Per discussion yesterday in office called patient's daughter-in-law Mary Walters.  I reviewed the results of her recent labs.  Her vitamin D level is improved, not yet back to normal.  She is currently taking 2000 international units/day.  I recommended continuing this dose she is just started it post discharge from rehab.  Would recommend repeat lab value in late summer and consideration of 50,000 international units every 8 weeks.  The patient's alkaline phosphatase continues to be elevated.  Her GGT level is also elevated.  She had previous liver imaging during her hospital stay with RUQ ultrasound. This showed no significant abnormalities but gallbladder sludge as there was no blood flow.  There are also several cysts which were characterized as benign.  Given this persistent elevation I would recommend repeat imaging.  She has had full spine imaging in hospital and the elevated GGT does not suggest a bony origin to this elevated alkaline phosphatase.  I also discussed elevated alkaline phosphatase with our pharmacist Dr. Valentina Lucks re: statin.  Reviewed this discussion with the family in the end we agreed to decrease atorvastatin to 20 mg.  She should take this at night.  We will repeat her cholesterol panel as well as her hepatic function panel in approximately 30 days.  Reminder sent to self to have home health nursing draw these labs in 30 days  Mary Singh, MD  [facility] Medicine Teaching Service

## 2018-06-25 ENCOUNTER — Telehealth: Payer: Self-pay

## 2018-06-25 DIAGNOSIS — I679 Cerebrovascular disease, unspecified: Secondary | ICD-10-CM | POA: Diagnosis not present

## 2018-06-25 DIAGNOSIS — E785 Hyperlipidemia, unspecified: Secondary | ICD-10-CM | POA: Diagnosis not present

## 2018-06-25 DIAGNOSIS — N183 Chronic kidney disease, stage 3 (moderate): Secondary | ICD-10-CM | POA: Diagnosis not present

## 2018-06-25 DIAGNOSIS — Z9181 History of falling: Secondary | ICD-10-CM | POA: Diagnosis not present

## 2018-06-25 DIAGNOSIS — I129 Hypertensive chronic kidney disease with stage 1 through stage 4 chronic kidney disease, or unspecified chronic kidney disease: Secondary | ICD-10-CM | POA: Diagnosis not present

## 2018-06-25 DIAGNOSIS — N390 Urinary tract infection, site not specified: Secondary | ICD-10-CM | POA: Diagnosis not present

## 2018-06-25 DIAGNOSIS — Z85830 Personal history of malignant neoplasm of bone: Secondary | ICD-10-CM | POA: Diagnosis not present

## 2018-06-25 DIAGNOSIS — E1122 Type 2 diabetes mellitus with diabetic chronic kidney disease: Secondary | ICD-10-CM | POA: Diagnosis not present

## 2018-06-25 DIAGNOSIS — M48061 Spinal stenosis, lumbar region without neurogenic claudication: Secondary | ICD-10-CM | POA: Diagnosis not present

## 2018-06-25 DIAGNOSIS — R69 Illness, unspecified: Secondary | ICD-10-CM | POA: Diagnosis not present

## 2018-06-25 NOTE — Telephone Encounter (Signed)
Cierra, home health nurse, LVM on nurse line stating the patients BP today was 174/100 sitting and 200/100 standing. No symptoms noted. Cierra stated the patient takes her BP meds at night and is compliant. Please call Cierra with any additional information regards medication changes or BP checks.   [phone]

## 2018-06-25 NOTE — Telephone Encounter (Signed)
Contacted pt daughter in law and gave her the appointment for pt [place].  It is scheduled for 07/08/2018 @ 10:45am.  [facility] [address].  NO food or drink after midnight the night before.  Pt daughter in law stated that the PT BP around 1:15 and 1:30 was 174/98 with nurse cuff and 184/97 with pt cuff.  Routing to Dr. Owens Shark in case she needs to do anything different. April Zimmerman Rumple, CMA

## 2018-06-25 NOTE — Telephone Encounter (Signed)
Attempted to call Mrs. Renville and family.  BP in office was within range and given her age, would like to be cautious with medications. Could increase candesartan to 8 mg given renal function or add amlodipine if persistently elevated.   We could perform ambulatory BP monitoring, will discuss further with family.   Dorris Singh, MD  Family Medicine Teaching Service

## 2018-06-26 NOTE — Telephone Encounter (Signed)
White team, Dr. Owens Shark had discussed doing ambulatory BP monitoring as a better measure of BP over a longer period of time. Could we please call family and schedule this with Dr. Valentina Lucks at a time that works for them?

## 2018-06-29 DIAGNOSIS — E785 Hyperlipidemia, unspecified: Secondary | ICD-10-CM | POA: Diagnosis not present

## 2018-06-29 DIAGNOSIS — N183 Chronic kidney disease, stage 3 (moderate): Secondary | ICD-10-CM | POA: Diagnosis not present

## 2018-06-29 DIAGNOSIS — I679 Cerebrovascular disease, unspecified: Secondary | ICD-10-CM | POA: Diagnosis not present

## 2018-06-29 DIAGNOSIS — Z9181 History of falling: Secondary | ICD-10-CM | POA: Diagnosis not present

## 2018-06-29 DIAGNOSIS — M48061 Spinal stenosis, lumbar region without neurogenic claudication: Secondary | ICD-10-CM | POA: Diagnosis not present

## 2018-06-29 DIAGNOSIS — R69 Illness, unspecified: Secondary | ICD-10-CM | POA: Diagnosis not present

## 2018-06-29 DIAGNOSIS — N390 Urinary tract infection, site not specified: Secondary | ICD-10-CM | POA: Diagnosis not present

## 2018-06-29 DIAGNOSIS — Z85830 Personal history of malignant neoplasm of bone: Secondary | ICD-10-CM | POA: Diagnosis not present

## 2018-06-29 DIAGNOSIS — E1122 Type 2 diabetes mellitus with diabetic chronic kidney disease: Secondary | ICD-10-CM | POA: Diagnosis not present

## 2018-06-29 DIAGNOSIS — I129 Hypertensive chronic kidney disease with stage 1 through stage 4 chronic kidney disease, or unspecified chronic kidney disease: Secondary | ICD-10-CM | POA: Diagnosis not present

## 2018-06-30 DIAGNOSIS — I679 Cerebrovascular disease, unspecified: Secondary | ICD-10-CM | POA: Diagnosis not present

## 2018-06-30 DIAGNOSIS — I129 Hypertensive chronic kidney disease with stage 1 through stage 4 chronic kidney disease, or unspecified chronic kidney disease: Secondary | ICD-10-CM | POA: Diagnosis not present

## 2018-06-30 DIAGNOSIS — Z9181 History of falling: Secondary | ICD-10-CM | POA: Diagnosis not present

## 2018-06-30 DIAGNOSIS — N183 Chronic kidney disease, stage 3 (moderate): Secondary | ICD-10-CM | POA: Diagnosis not present

## 2018-06-30 DIAGNOSIS — Z85830 Personal history of malignant neoplasm of bone: Secondary | ICD-10-CM | POA: Diagnosis not present

## 2018-06-30 DIAGNOSIS — R69 Illness, unspecified: Secondary | ICD-10-CM | POA: Diagnosis not present

## 2018-06-30 DIAGNOSIS — M48061 Spinal stenosis, lumbar region without neurogenic claudication: Secondary | ICD-10-CM | POA: Diagnosis not present

## 2018-06-30 DIAGNOSIS — N390 Urinary tract infection, site not specified: Secondary | ICD-10-CM | POA: Diagnosis not present

## 2018-06-30 DIAGNOSIS — E1122 Type 2 diabetes mellitus with diabetic chronic kidney disease: Secondary | ICD-10-CM | POA: Diagnosis not present

## 2018-06-30 DIAGNOSIS — E785 Hyperlipidemia, unspecified: Secondary | ICD-10-CM | POA: Diagnosis not present

## 2018-07-01 DIAGNOSIS — E1122 Type 2 diabetes mellitus with diabetic chronic kidney disease: Secondary | ICD-10-CM | POA: Diagnosis not present

## 2018-07-01 DIAGNOSIS — N390 Urinary tract infection, site not specified: Secondary | ICD-10-CM | POA: Diagnosis not present

## 2018-07-01 DIAGNOSIS — E785 Hyperlipidemia, unspecified: Secondary | ICD-10-CM | POA: Diagnosis not present

## 2018-07-01 DIAGNOSIS — Z9181 History of falling: Secondary | ICD-10-CM | POA: Diagnosis not present

## 2018-07-01 DIAGNOSIS — R69 Illness, unspecified: Secondary | ICD-10-CM | POA: Diagnosis not present

## 2018-07-01 DIAGNOSIS — I129 Hypertensive chronic kidney disease with stage 1 through stage 4 chronic kidney disease, or unspecified chronic kidney disease: Secondary | ICD-10-CM | POA: Diagnosis not present

## 2018-07-01 DIAGNOSIS — Z85830 Personal history of malignant neoplasm of bone: Secondary | ICD-10-CM | POA: Diagnosis not present

## 2018-07-01 DIAGNOSIS — I679 Cerebrovascular disease, unspecified: Secondary | ICD-10-CM | POA: Diagnosis not present

## 2018-07-01 DIAGNOSIS — N183 Chronic kidney disease, stage 3 (moderate): Secondary | ICD-10-CM | POA: Diagnosis not present

## 2018-07-01 DIAGNOSIS — M48061 Spinal stenosis, lumbar region without neurogenic claudication: Secondary | ICD-10-CM | POA: Diagnosis not present

## 2018-07-02 DIAGNOSIS — Z9181 History of falling: Secondary | ICD-10-CM | POA: Diagnosis not present

## 2018-07-02 DIAGNOSIS — N183 Chronic kidney disease, stage 3 (moderate): Secondary | ICD-10-CM | POA: Diagnosis not present

## 2018-07-02 DIAGNOSIS — E785 Hyperlipidemia, unspecified: Secondary | ICD-10-CM | POA: Diagnosis not present

## 2018-07-02 DIAGNOSIS — R69 Illness, unspecified: Secondary | ICD-10-CM | POA: Diagnosis not present

## 2018-07-02 DIAGNOSIS — M48061 Spinal stenosis, lumbar region without neurogenic claudication: Secondary | ICD-10-CM | POA: Diagnosis not present

## 2018-07-02 DIAGNOSIS — N390 Urinary tract infection, site not specified: Secondary | ICD-10-CM | POA: Diagnosis not present

## 2018-07-02 DIAGNOSIS — I679 Cerebrovascular disease, unspecified: Secondary | ICD-10-CM | POA: Diagnosis not present

## 2018-07-02 DIAGNOSIS — E1122 Type 2 diabetes mellitus with diabetic chronic kidney disease: Secondary | ICD-10-CM | POA: Diagnosis not present

## 2018-07-02 DIAGNOSIS — I129 Hypertensive chronic kidney disease with stage 1 through stage 4 chronic kidney disease, or unspecified chronic kidney disease: Secondary | ICD-10-CM | POA: Diagnosis not present

## 2018-07-02 DIAGNOSIS — Z85830 Personal history of malignant neoplasm of bone: Secondary | ICD-10-CM | POA: Diagnosis not present

## 2018-07-06 DIAGNOSIS — Z9181 History of falling: Secondary | ICD-10-CM | POA: Diagnosis not present

## 2018-07-06 DIAGNOSIS — I679 Cerebrovascular disease, unspecified: Secondary | ICD-10-CM | POA: Diagnosis not present

## 2018-07-06 DIAGNOSIS — R69 Illness, unspecified: Secondary | ICD-10-CM | POA: Diagnosis not present

## 2018-07-06 DIAGNOSIS — E785 Hyperlipidemia, unspecified: Secondary | ICD-10-CM | POA: Diagnosis not present

## 2018-07-06 DIAGNOSIS — E1122 Type 2 diabetes mellitus with diabetic chronic kidney disease: Secondary | ICD-10-CM | POA: Diagnosis not present

## 2018-07-06 DIAGNOSIS — M48061 Spinal stenosis, lumbar region without neurogenic claudication: Secondary | ICD-10-CM | POA: Diagnosis not present

## 2018-07-06 DIAGNOSIS — I129 Hypertensive chronic kidney disease with stage 1 through stage 4 chronic kidney disease, or unspecified chronic kidney disease: Secondary | ICD-10-CM | POA: Diagnosis not present

## 2018-07-06 DIAGNOSIS — N183 Chronic kidney disease, stage 3 (moderate): Secondary | ICD-10-CM | POA: Diagnosis not present

## 2018-07-06 DIAGNOSIS — Z85830 Personal history of malignant neoplasm of bone: Secondary | ICD-10-CM | POA: Diagnosis not present

## 2018-07-06 DIAGNOSIS — N390 Urinary tract infection, site not specified: Secondary | ICD-10-CM | POA: Diagnosis not present

## 2018-07-08 ENCOUNTER — Ambulatory Visit
Admission: RE | Admit: 2018-07-08 | Discharge: 2018-07-08 | Disposition: A | Discharge status: Home / Self Care | Payer: Medicare HMO | Source: Ambulatory Visit | Attending: Family Medicine | Admitting: Family Medicine

## 2018-07-08 DIAGNOSIS — E1122 Type 2 diabetes mellitus with diabetic chronic kidney disease: Secondary | ICD-10-CM | POA: Diagnosis not present

## 2018-07-08 DIAGNOSIS — R69 Illness, unspecified: Secondary | ICD-10-CM | POA: Diagnosis not present

## 2018-07-08 DIAGNOSIS — M48061 Spinal stenosis, lumbar region without neurogenic claudication: Secondary | ICD-10-CM | POA: Diagnosis not present

## 2018-07-08 DIAGNOSIS — E785 Hyperlipidemia, unspecified: Secondary | ICD-10-CM | POA: Diagnosis not present

## 2018-07-08 DIAGNOSIS — R748 Abnormal levels of other serum enzymes: Secondary | ICD-10-CM

## 2018-07-08 DIAGNOSIS — N390 Urinary tract infection, site not specified: Secondary | ICD-10-CM | POA: Diagnosis not present

## 2018-07-08 DIAGNOSIS — I129 Hypertensive chronic kidney disease with stage 1 through stage 4 chronic kidney disease, or unspecified chronic kidney disease: Secondary | ICD-10-CM | POA: Diagnosis not present

## 2018-07-08 DIAGNOSIS — Z85830 Personal history of malignant neoplasm of bone: Secondary | ICD-10-CM | POA: Diagnosis not present

## 2018-07-08 DIAGNOSIS — K7689 Other specified diseases of liver: Secondary | ICD-10-CM | POA: Diagnosis not present

## 2018-07-08 DIAGNOSIS — I679 Cerebrovascular disease, unspecified: Secondary | ICD-10-CM | POA: Diagnosis not present

## 2018-07-08 DIAGNOSIS — Z9181 History of falling: Secondary | ICD-10-CM | POA: Diagnosis not present

## 2018-07-08 DIAGNOSIS — N183 Chronic kidney disease, stage 3 (moderate): Secondary | ICD-10-CM | POA: Diagnosis not present

## 2018-07-08 NOTE — Telephone Encounter (Signed)
Contacted pt daughter in law and scheduled for AMB BP with Dr. Valentina Lucks for 07/09/2018.Mary Walters, CMA

## 2018-07-08 NOTE — Telephone Encounter (Signed)
Contacted pt daughter in law and scheduled AMB BP with Dr. Valentina Lucks on 07/09/2018. Shalon Salado Zimmerman Rumple, CMA

## 2018-07-09 ENCOUNTER — Telehealth: Payer: Self-pay | Admitting: Family Medicine

## 2018-07-09 ENCOUNTER — Ambulatory Visit (INDEPENDENT_AMBULATORY_CARE_PROVIDER_SITE_OTHER): Payer: Medicare HMO | Admitting: Pharmacist

## 2018-07-09 ENCOUNTER — Other Ambulatory Visit: Payer: Self-pay

## 2018-07-09 ENCOUNTER — Encounter: Payer: Self-pay | Admitting: Pharmacist

## 2018-07-09 DIAGNOSIS — I10 Essential (primary) hypertension: Secondary | ICD-10-CM | POA: Diagnosis not present

## 2018-07-09 DIAGNOSIS — E1159 Type 2 diabetes mellitus with other circulatory complications: Secondary | ICD-10-CM

## 2018-07-09 DIAGNOSIS — R748 Abnormal levels of other serum enzymes: Secondary | ICD-10-CM

## 2018-07-09 NOTE — Telephone Encounter (Signed)
Called patient and family--discussed results of RUQ with daughter in law, Sunday Spillers. Referral to GI placed will follow up ambulatory BP. Dorris Singh, MD  Family Medicine Teaching Service

## 2018-07-09 NOTE — Telephone Encounter (Signed)
Attempted to call patient's family regarding results.  Left generic voicemail.  Overall liver ultrasound looks quite good.  Mrs. Ahner does have some liver cysts, which are benign appearing.  This is good news.  I do not believe this is the cause of the elevation in her liver function tests.  I would like to recommend referral to gastroenterology.  Will discuss further when family calls back.  Dorris Singh, MD  Family Medicine Teaching Service

## 2018-07-09 NOTE — Patient Instructions (Addendum)
Wearing the Blood Pressure Monitor  The cuff will inflate every 20 minutes during the day and every 30 minutes while you sleep.  Your blood pressure readings will NOT display after cuff inflation  Fill out the blood pressure-activity diary during the day, especially during activities that may affect your reading -- such as exercise, stress, walking, taking your blood pressure medications  Important things to know:  Avoid taking the monitor off for the next 24 hours, unless it causes you discomfort or pain.  Do NOT get the monitor wet and do NOT dry to clean the monitor with any cleaning products.  Do NOT drop the monitor.  Do NOT put the monitor on anyone else's arm.  When the cuff inflates, avoid excess movement. Let the cuffed arm hang loosely, slightly away from the body. Avoid flexing the muscles or moving the hand/fingers.  When you go to sleep, make sure that the hose is not kinked.  Remember to fill out the blood pressure activity diary.  If you experience severe pain or unusual pain (not associated with getting your blood pressure checked), remove the monitor.  Troubleshooting:  Code  Troubleshooting   1  Check cuff position, tighten cuff   2, 3  Remain still during reading   4, 87  Check air hose connections and make sure cuff is tight   85, 89  Check hose connections and make tubing is not crimped   86  Push START/STOP to restart reading   88, 91  Retry by pushing START/STOP   90  Replace batteries. If problem persists, remove monitor and bring back to   clinic at follow up   97, 98, 99  Service required - Remove monitor and bring back to clinic at follow up    Blood Pressure Activity Diary  Time Lying down/ Sleeping Walking/ Exercise Stressed/ Angry Headache/ Pain Dizzy  9 AM       10 AM       11 AM       12 PM       1 PM       2 PM       Time Lying down/ Sleeping Walking/ Exercise Stressed/ Angry Headache/ Pain Dizzy  3 PM       4 PM        5 PM       6  PM       7 PM       8 PM       Time Lying down/ Sleeping Walking/ Exercise Stressed/ Angry Headache/ Pain Dizzy  9 PM       10 PM       11 PM       12 AM       1 AM       2 AM       3 AM       Time Lying down/ Sleeping Walking/ Exercise Stressed/ Angry Headache/ Pain Dizzy  4 AM       5 AM       6 AM       7 AM       8 AM       9 AM       10 AM        Time you woke up: _________                  Time you went to sleep:__________      Come back tomorrow at ___________ to have the monitor removed  Call the Family Medicine Clinic if you have any questions before then ([phone]) 

## 2018-07-09 NOTE — Progress Notes (Signed)
   S:    Patient arrives in good spirits, in a wheelchair, accompanied by her daughter-in-law.  Presents to the clinic for ambulatory blood pressure evaluation.  Patient was referred on 06/25/2018 by primary care provider Dr. Clelia Croft. Owens Shark. Patient was last seen by Dr. Owens Shark on 06/23/2018.   Home health nurse Valinda Party noted episodes of hypertension with SBP 170-200.   Denies any falls since coming home from SNF.   Notes adherence to medications.   Discussed procedure for wearing the monitor and gave patient written instructions. Monitor was placed on non-dominant arm with instructions to return in the morning.   Current BP Medications include:  Candesartan 4 mg QAM (between 8-10 am)  Antihypertensives tried in the past include: amlodipine   Patient returned device to the clinic 6/26. Device was interrogated and results were reported via phone to [facility] and directly to [employer] (in office).   O:   Physical Exam Constitutional:      Appearance: Normal appearance.  Musculoskeletal:        General: No swelling.     Comments: No pretibial edema  Neurological:     Mental Status: She is alert.  Psychiatric:        Mood and Affect: Mood normal.    Review of Systems  Cardiovascular: Negative for leg swelling.  Musculoskeletal: Negative for falls.  Neurological: Negative for dizziness.    Last 3 Office BP readings: BP Readings from Last 3 Encounters:  06/23/18 (!) 152/80  04/14/18 (!) 151/83  12/02/17 140/90     Clinical ASCVD: Yes    BMET  ABPM Study Data: Arm Placement right arm   Overall Mean 24hr BP:   160/80  mmHg HR: 73   Daytime Mean BP:  170/86  mmHg HR: 74   Nighttime Mean BP:  146/72  mmHg HR: 71   Dipping Pattern: Yes.    Sys:   14%   Dia: 16.7%   [normal dipping ~10-20%]   Non-hypertensive ABPM thresholds: daytime BP <125/75 mmHg, sleeptime BP <120/70 mmHg   BMET    Component Value Date/Time   NA 142 06/23/2018 1432   K 4.1 06/23/2018 1432   CL 103 06/23/2018 1432   CO2 23 06/23/2018 1432   GLUCOSE 184 (H) 06/23/2018 1432   GLUCOSE 245 (H) 04/13/2018 0714   BUN 18 06/23/2018 1432   CREATININE 0.83 06/23/2018 1432   CALCIUM 9.6 06/23/2018 1432   GFRNONAA 63 06/23/2018 1432   GFRAA 73 06/23/2018 1432    A/P: History of hypertension found to have isolated systolic hypertension.  24-hour ambulatory blood pressure demonstrates awake SBP of ~ 170 as noted by nursing/home health.  Nocturnal dipping pattern is near goal of ~ 15 % dipping.  Changes to medications increased candesartan from 4mg  to 8mg  (instructed to take two until supply is used) AND started amlodipine 2.5mg .  Both of these adjustments included QPM dosing.      Results reviewed and written information provided.  Total time in face-to-face counseling 15 minutes.   F/U Clinic Visit with Dr. Owens Shark.  Patient seen with Evangeline Gula PharmD Candidate and Catie Darnelle Maffucci, PharmD, PGY2 Pharmacy Resident

## 2018-07-13 ENCOUNTER — Telehealth: Payer: Self-pay | Admitting: Pharmacist

## 2018-07-13 DIAGNOSIS — M48061 Spinal stenosis, lumbar region without neurogenic claudication: Secondary | ICD-10-CM | POA: Diagnosis not present

## 2018-07-13 DIAGNOSIS — I129 Hypertensive chronic kidney disease with stage 1 through stage 4 chronic kidney disease, or unspecified chronic kidney disease: Secondary | ICD-10-CM | POA: Diagnosis not present

## 2018-07-13 DIAGNOSIS — I679 Cerebrovascular disease, unspecified: Secondary | ICD-10-CM | POA: Diagnosis not present

## 2018-07-13 DIAGNOSIS — Z85830 Personal history of malignant neoplasm of bone: Secondary | ICD-10-CM | POA: Diagnosis not present

## 2018-07-13 DIAGNOSIS — R69 Illness, unspecified: Secondary | ICD-10-CM | POA: Diagnosis not present

## 2018-07-13 DIAGNOSIS — N390 Urinary tract infection, site not specified: Secondary | ICD-10-CM | POA: Diagnosis not present

## 2018-07-13 DIAGNOSIS — E785 Hyperlipidemia, unspecified: Secondary | ICD-10-CM | POA: Diagnosis not present

## 2018-07-13 DIAGNOSIS — Z9181 History of falling: Secondary | ICD-10-CM | POA: Diagnosis not present

## 2018-07-13 DIAGNOSIS — E1122 Type 2 diabetes mellitus with diabetic chronic kidney disease: Secondary | ICD-10-CM | POA: Diagnosis not present

## 2018-07-13 DIAGNOSIS — N183 Chronic kidney disease, stage 3 (moderate): Secondary | ICD-10-CM | POA: Diagnosis not present

## 2018-07-13 MED ORDER — AMLODIPINE BESYLATE 2.5 MG PO TABS: 2.5000 mg | ORAL_TABLET | Freq: Every day | ORAL | 1 refills | Status: DC

## 2018-07-13 MED ORDER — CANDESARTAN CILEXETIL 8 MG PO TABS: 8.0000 mg | ORAL_TABLET | Freq: Every day | ORAL | 3 refills | Status: DC

## 2018-07-13 NOTE — Telephone Encounter (Signed)
Shared results of Amb BP monitor with Sunday Spillers via phone and [place] (in office).  Changes noted in note 6/25 - date  Increased candesartan from 4mg  to 8mg  QPM  Started amlodipine 2.5mg  QPM

## 2018-07-13 NOTE — Progress Notes (Signed)
Patient ID: Mary Walters, female   DOB: 10-06-1928, 85 y.o.   MRN: [id number] Reviewed: Agree with Dr. Graylin Shiver documentation and management.

## 2018-07-15 DIAGNOSIS — Z9181 History of falling: Secondary | ICD-10-CM | POA: Diagnosis not present

## 2018-07-15 DIAGNOSIS — N183 Chronic kidney disease, stage 3 (moderate): Secondary | ICD-10-CM | POA: Diagnosis not present

## 2018-07-15 DIAGNOSIS — R69 Illness, unspecified: Secondary | ICD-10-CM | POA: Diagnosis not present

## 2018-07-15 DIAGNOSIS — E785 Hyperlipidemia, unspecified: Secondary | ICD-10-CM | POA: Diagnosis not present

## 2018-07-15 DIAGNOSIS — I129 Hypertensive chronic kidney disease with stage 1 through stage 4 chronic kidney disease, or unspecified chronic kidney disease: Secondary | ICD-10-CM | POA: Diagnosis not present

## 2018-07-15 DIAGNOSIS — Z85830 Personal history of malignant neoplasm of bone: Secondary | ICD-10-CM | POA: Diagnosis not present

## 2018-07-15 DIAGNOSIS — N390 Urinary tract infection, site not specified: Secondary | ICD-10-CM | POA: Diagnosis not present

## 2018-07-15 DIAGNOSIS — E1122 Type 2 diabetes mellitus with diabetic chronic kidney disease: Secondary | ICD-10-CM | POA: Diagnosis not present

## 2018-07-15 DIAGNOSIS — M48061 Spinal stenosis, lumbar region without neurogenic claudication: Secondary | ICD-10-CM | POA: Diagnosis not present

## 2018-07-15 DIAGNOSIS — I679 Cerebrovascular disease, unspecified: Secondary | ICD-10-CM | POA: Diagnosis not present

## 2018-07-16 ENCOUNTER — Telehealth: Payer: Self-pay | Admitting: *Deleted

## 2018-07-16 NOTE — Telephone Encounter (Signed)
Mickel Baas from [facility] calling for PT verbal orders as follows:  2 time(s) weekly for 4 week(s)  You can leave verbal orders on confidential voicemail.  Christen Bame, CMA

## 2018-07-19 DIAGNOSIS — R69 Illness, unspecified: Secondary | ICD-10-CM | POA: Diagnosis not present

## 2018-07-19 DIAGNOSIS — Z9181 History of falling: Secondary | ICD-10-CM | POA: Diagnosis not present

## 2018-07-19 DIAGNOSIS — I679 Cerebrovascular disease, unspecified: Secondary | ICD-10-CM | POA: Diagnosis not present

## 2018-07-19 DIAGNOSIS — E785 Hyperlipidemia, unspecified: Secondary | ICD-10-CM | POA: Diagnosis not present

## 2018-07-19 DIAGNOSIS — I129 Hypertensive chronic kidney disease with stage 1 through stage 4 chronic kidney disease, or unspecified chronic kidney disease: Secondary | ICD-10-CM | POA: Diagnosis not present

## 2018-07-19 DIAGNOSIS — N183 Chronic kidney disease, stage 3 (moderate): Secondary | ICD-10-CM | POA: Diagnosis not present

## 2018-07-19 DIAGNOSIS — N390 Urinary tract infection, site not specified: Secondary | ICD-10-CM | POA: Diagnosis not present

## 2018-07-19 DIAGNOSIS — Z85830 Personal history of malignant neoplasm of bone: Secondary | ICD-10-CM | POA: Diagnosis not present

## 2018-07-19 DIAGNOSIS — M48061 Spinal stenosis, lumbar region without neurogenic claudication: Secondary | ICD-10-CM | POA: Diagnosis not present

## 2018-07-19 DIAGNOSIS — E1122 Type 2 diabetes mellitus with diabetic chronic kidney disease: Secondary | ICD-10-CM | POA: Diagnosis not present

## 2018-07-20 DIAGNOSIS — E1122 Type 2 diabetes mellitus with diabetic chronic kidney disease: Secondary | ICD-10-CM | POA: Diagnosis not present

## 2018-07-20 DIAGNOSIS — Z9181 History of falling: Secondary | ICD-10-CM | POA: Diagnosis not present

## 2018-07-20 DIAGNOSIS — M48061 Spinal stenosis, lumbar region without neurogenic claudication: Secondary | ICD-10-CM | POA: Diagnosis not present

## 2018-07-20 DIAGNOSIS — E785 Hyperlipidemia, unspecified: Secondary | ICD-10-CM | POA: Diagnosis not present

## 2018-07-20 DIAGNOSIS — Z85830 Personal history of malignant neoplasm of bone: Secondary | ICD-10-CM | POA: Diagnosis not present

## 2018-07-20 DIAGNOSIS — I679 Cerebrovascular disease, unspecified: Secondary | ICD-10-CM | POA: Diagnosis not present

## 2018-07-20 DIAGNOSIS — I129 Hypertensive chronic kidney disease with stage 1 through stage 4 chronic kidney disease, or unspecified chronic kidney disease: Secondary | ICD-10-CM | POA: Diagnosis not present

## 2018-07-20 DIAGNOSIS — N183 Chronic kidney disease, stage 3 (moderate): Secondary | ICD-10-CM | POA: Diagnosis not present

## 2018-07-20 DIAGNOSIS — N390 Urinary tract infection, site not specified: Secondary | ICD-10-CM | POA: Diagnosis not present

## 2018-07-20 DIAGNOSIS — R69 Illness, unspecified: Secondary | ICD-10-CM | POA: Diagnosis not present

## 2018-07-22 DIAGNOSIS — Z85830 Personal history of malignant neoplasm of bone: Secondary | ICD-10-CM | POA: Diagnosis not present

## 2018-07-22 DIAGNOSIS — E785 Hyperlipidemia, unspecified: Secondary | ICD-10-CM | POA: Diagnosis not present

## 2018-07-22 DIAGNOSIS — N390 Urinary tract infection, site not specified: Secondary | ICD-10-CM | POA: Diagnosis not present

## 2018-07-22 DIAGNOSIS — I129 Hypertensive chronic kidney disease with stage 1 through stage 4 chronic kidney disease, or unspecified chronic kidney disease: Secondary | ICD-10-CM | POA: Diagnosis not present

## 2018-07-22 DIAGNOSIS — Z9181 History of falling: Secondary | ICD-10-CM | POA: Diagnosis not present

## 2018-07-22 DIAGNOSIS — N183 Chronic kidney disease, stage 3 (moderate): Secondary | ICD-10-CM | POA: Diagnosis not present

## 2018-07-22 DIAGNOSIS — E1122 Type 2 diabetes mellitus with diabetic chronic kidney disease: Secondary | ICD-10-CM | POA: Diagnosis not present

## 2018-07-22 DIAGNOSIS — R69 Illness, unspecified: Secondary | ICD-10-CM | POA: Diagnosis not present

## 2018-07-22 DIAGNOSIS — I679 Cerebrovascular disease, unspecified: Secondary | ICD-10-CM | POA: Diagnosis not present

## 2018-07-22 DIAGNOSIS — M48061 Spinal stenosis, lumbar region without neurogenic claudication: Secondary | ICD-10-CM | POA: Diagnosis not present

## 2018-07-23 ENCOUNTER — Telehealth: Payer: Self-pay

## 2018-07-23 DIAGNOSIS — I6300 Cerebral infarction due to thrombosis of unspecified precerebral artery: Secondary | ICD-10-CM

## 2018-07-23 DIAGNOSIS — M5136 Other intervertebral disc degeneration, lumbar region: Secondary | ICD-10-CM

## 2018-07-23 NOTE — Telephone Encounter (Signed)
Pt daughter in law is asking for a walker with a seat to be ordered for pt. Pt is needing this to get out and walk and have a seat if she gets tired. Please advise. Ottis Stain, CMA

## 2018-07-24 NOTE — Addendum Note (Signed)
Addended by: Owens Shark, Lynell Greenhouse on: 07/24/2018 08:31 PM   Modules accepted: Orders

## 2018-07-24 NOTE — Telephone Encounter (Signed)
Ordered walker with wheels and seat.  Dorris Singh, MD  Family Medicine Teaching Service

## 2018-07-27 DIAGNOSIS — E1122 Type 2 diabetes mellitus with diabetic chronic kidney disease: Secondary | ICD-10-CM | POA: Diagnosis not present

## 2018-07-27 DIAGNOSIS — M48061 Spinal stenosis, lumbar region without neurogenic claudication: Secondary | ICD-10-CM | POA: Diagnosis not present

## 2018-07-27 DIAGNOSIS — N390 Urinary tract infection, site not specified: Secondary | ICD-10-CM | POA: Diagnosis not present

## 2018-07-27 DIAGNOSIS — E785 Hyperlipidemia, unspecified: Secondary | ICD-10-CM | POA: Diagnosis not present

## 2018-07-27 DIAGNOSIS — I679 Cerebrovascular disease, unspecified: Secondary | ICD-10-CM | POA: Diagnosis not present

## 2018-07-27 DIAGNOSIS — R69 Illness, unspecified: Secondary | ICD-10-CM | POA: Diagnosis not present

## 2018-07-27 DIAGNOSIS — Z9181 History of falling: Secondary | ICD-10-CM | POA: Diagnosis not present

## 2018-07-27 DIAGNOSIS — I129 Hypertensive chronic kidney disease with stage 1 through stage 4 chronic kidney disease, or unspecified chronic kidney disease: Secondary | ICD-10-CM | POA: Diagnosis not present

## 2018-07-27 DIAGNOSIS — N183 Chronic kidney disease, stage 3 (moderate): Secondary | ICD-10-CM | POA: Diagnosis not present

## 2018-07-27 DIAGNOSIS — Z85830 Personal history of malignant neoplasm of bone: Secondary | ICD-10-CM | POA: Diagnosis not present

## 2018-07-28 DIAGNOSIS — E785 Hyperlipidemia, unspecified: Secondary | ICD-10-CM | POA: Diagnosis not present

## 2018-07-28 DIAGNOSIS — N390 Urinary tract infection, site not specified: Secondary | ICD-10-CM | POA: Diagnosis not present

## 2018-07-28 DIAGNOSIS — M48061 Spinal stenosis, lumbar region without neurogenic claudication: Secondary | ICD-10-CM | POA: Diagnosis not present

## 2018-07-28 DIAGNOSIS — Z9181 History of falling: Secondary | ICD-10-CM | POA: Diagnosis not present

## 2018-07-28 DIAGNOSIS — E1122 Type 2 diabetes mellitus with diabetic chronic kidney disease: Secondary | ICD-10-CM | POA: Diagnosis not present

## 2018-07-28 DIAGNOSIS — N183 Chronic kidney disease, stage 3 (moderate): Secondary | ICD-10-CM | POA: Diagnosis not present

## 2018-07-28 DIAGNOSIS — I129 Hypertensive chronic kidney disease with stage 1 through stage 4 chronic kidney disease, or unspecified chronic kidney disease: Secondary | ICD-10-CM | POA: Diagnosis not present

## 2018-07-28 DIAGNOSIS — R69 Illness, unspecified: Secondary | ICD-10-CM | POA: Diagnosis not present

## 2018-07-28 DIAGNOSIS — Z85830 Personal history of malignant neoplasm of bone: Secondary | ICD-10-CM | POA: Diagnosis not present

## 2018-07-28 DIAGNOSIS — I679 Cerebrovascular disease, unspecified: Secondary | ICD-10-CM | POA: Diagnosis not present

## 2018-07-29 DIAGNOSIS — Z85830 Personal history of malignant neoplasm of bone: Secondary | ICD-10-CM | POA: Diagnosis not present

## 2018-07-29 DIAGNOSIS — N183 Chronic kidney disease, stage 3 (moderate): Secondary | ICD-10-CM | POA: Diagnosis not present

## 2018-07-29 DIAGNOSIS — I129 Hypertensive chronic kidney disease with stage 1 through stage 4 chronic kidney disease, or unspecified chronic kidney disease: Secondary | ICD-10-CM | POA: Diagnosis not present

## 2018-07-29 DIAGNOSIS — I679 Cerebrovascular disease, unspecified: Secondary | ICD-10-CM | POA: Diagnosis not present

## 2018-07-29 DIAGNOSIS — E1122 Type 2 diabetes mellitus with diabetic chronic kidney disease: Secondary | ICD-10-CM | POA: Diagnosis not present

## 2018-07-29 DIAGNOSIS — M48061 Spinal stenosis, lumbar region without neurogenic claudication: Secondary | ICD-10-CM | POA: Diagnosis not present

## 2018-07-29 DIAGNOSIS — R69 Illness, unspecified: Secondary | ICD-10-CM | POA: Diagnosis not present

## 2018-07-29 DIAGNOSIS — E785 Hyperlipidemia, unspecified: Secondary | ICD-10-CM | POA: Diagnosis not present

## 2018-07-29 DIAGNOSIS — N390 Urinary tract infection, site not specified: Secondary | ICD-10-CM | POA: Diagnosis not present

## 2018-07-29 DIAGNOSIS — Z9181 History of falling: Secondary | ICD-10-CM | POA: Diagnosis not present

## 2018-08-03 DIAGNOSIS — M48061 Spinal stenosis, lumbar region without neurogenic claudication: Secondary | ICD-10-CM | POA: Diagnosis not present

## 2018-08-03 DIAGNOSIS — I679 Cerebrovascular disease, unspecified: Secondary | ICD-10-CM | POA: Diagnosis not present

## 2018-08-03 DIAGNOSIS — E1122 Type 2 diabetes mellitus with diabetic chronic kidney disease: Secondary | ICD-10-CM | POA: Diagnosis not present

## 2018-08-03 DIAGNOSIS — N183 Chronic kidney disease, stage 3 (moderate): Secondary | ICD-10-CM | POA: Diagnosis not present

## 2018-08-03 DIAGNOSIS — R69 Illness, unspecified: Secondary | ICD-10-CM | POA: Diagnosis not present

## 2018-08-03 DIAGNOSIS — Z9181 History of falling: Secondary | ICD-10-CM | POA: Diagnosis not present

## 2018-08-03 DIAGNOSIS — I129 Hypertensive chronic kidney disease with stage 1 through stage 4 chronic kidney disease, or unspecified chronic kidney disease: Secondary | ICD-10-CM | POA: Diagnosis not present

## 2018-08-03 DIAGNOSIS — E785 Hyperlipidemia, unspecified: Secondary | ICD-10-CM | POA: Diagnosis not present

## 2018-08-03 DIAGNOSIS — N390 Urinary tract infection, site not specified: Secondary | ICD-10-CM | POA: Diagnosis not present

## 2018-08-03 DIAGNOSIS — Z85830 Personal history of malignant neoplasm of bone: Secondary | ICD-10-CM | POA: Diagnosis not present

## 2018-08-05 DIAGNOSIS — N390 Urinary tract infection, site not specified: Secondary | ICD-10-CM | POA: Diagnosis not present

## 2018-08-05 DIAGNOSIS — Z9181 History of falling: Secondary | ICD-10-CM | POA: Diagnosis not present

## 2018-08-05 DIAGNOSIS — N183 Chronic kidney disease, stage 3 (moderate): Secondary | ICD-10-CM | POA: Diagnosis not present

## 2018-08-05 DIAGNOSIS — R69 Illness, unspecified: Secondary | ICD-10-CM | POA: Diagnosis not present

## 2018-08-05 DIAGNOSIS — E1122 Type 2 diabetes mellitus with diabetic chronic kidney disease: Secondary | ICD-10-CM | POA: Diagnosis not present

## 2018-08-05 DIAGNOSIS — M48061 Spinal stenosis, lumbar region without neurogenic claudication: Secondary | ICD-10-CM | POA: Diagnosis not present

## 2018-08-05 DIAGNOSIS — I129 Hypertensive chronic kidney disease with stage 1 through stage 4 chronic kidney disease, or unspecified chronic kidney disease: Secondary | ICD-10-CM | POA: Diagnosis not present

## 2018-08-05 DIAGNOSIS — I679 Cerebrovascular disease, unspecified: Secondary | ICD-10-CM | POA: Diagnosis not present

## 2018-08-05 DIAGNOSIS — Z85830 Personal history of malignant neoplasm of bone: Secondary | ICD-10-CM | POA: Diagnosis not present

## 2018-08-05 DIAGNOSIS — E785 Hyperlipidemia, unspecified: Secondary | ICD-10-CM | POA: Diagnosis not present

## 2018-08-10 DIAGNOSIS — M48061 Spinal stenosis, lumbar region without neurogenic claudication: Secondary | ICD-10-CM | POA: Diagnosis not present

## 2018-08-10 DIAGNOSIS — R69 Illness, unspecified: Secondary | ICD-10-CM | POA: Diagnosis not present

## 2018-08-10 DIAGNOSIS — Z85830 Personal history of malignant neoplasm of bone: Secondary | ICD-10-CM | POA: Diagnosis not present

## 2018-08-10 DIAGNOSIS — E1122 Type 2 diabetes mellitus with diabetic chronic kidney disease: Secondary | ICD-10-CM | POA: Diagnosis not present

## 2018-08-10 DIAGNOSIS — Z9181 History of falling: Secondary | ICD-10-CM | POA: Diagnosis not present

## 2018-08-10 DIAGNOSIS — N183 Chronic kidney disease, stage 3 (moderate): Secondary | ICD-10-CM | POA: Diagnosis not present

## 2018-08-10 DIAGNOSIS — I129 Hypertensive chronic kidney disease with stage 1 through stage 4 chronic kidney disease, or unspecified chronic kidney disease: Secondary | ICD-10-CM | POA: Diagnosis not present

## 2018-08-10 DIAGNOSIS — E785 Hyperlipidemia, unspecified: Secondary | ICD-10-CM | POA: Diagnosis not present

## 2018-08-10 DIAGNOSIS — N390 Urinary tract infection, site not specified: Secondary | ICD-10-CM | POA: Diagnosis not present

## 2018-08-10 DIAGNOSIS — I679 Cerebrovascular disease, unspecified: Secondary | ICD-10-CM | POA: Diagnosis not present

## 2018-08-12 ENCOUNTER — Encounter: Payer: Self-pay | Admitting: Gastroenterology

## 2018-08-14 ENCOUNTER — Telehealth: Payer: Self-pay | Admitting: *Deleted

## 2018-08-14 DIAGNOSIS — N390 Urinary tract infection, site not specified: Secondary | ICD-10-CM | POA: Diagnosis not present

## 2018-08-14 DIAGNOSIS — E785 Hyperlipidemia, unspecified: Secondary | ICD-10-CM | POA: Diagnosis not present

## 2018-08-14 DIAGNOSIS — R69 Illness, unspecified: Secondary | ICD-10-CM | POA: Diagnosis not present

## 2018-08-14 DIAGNOSIS — N183 Chronic kidney disease, stage 3 (moderate): Secondary | ICD-10-CM | POA: Diagnosis not present

## 2018-08-14 DIAGNOSIS — E1122 Type 2 diabetes mellitus with diabetic chronic kidney disease: Secondary | ICD-10-CM | POA: Diagnosis not present

## 2018-08-14 DIAGNOSIS — M48061 Spinal stenosis, lumbar region without neurogenic claudication: Secondary | ICD-10-CM | POA: Diagnosis not present

## 2018-08-14 DIAGNOSIS — Z9181 History of falling: Secondary | ICD-10-CM | POA: Diagnosis not present

## 2018-08-14 DIAGNOSIS — I129 Hypertensive chronic kidney disease with stage 1 through stage 4 chronic kidney disease, or unspecified chronic kidney disease: Secondary | ICD-10-CM | POA: Diagnosis not present

## 2018-08-14 DIAGNOSIS — I679 Cerebrovascular disease, unspecified: Secondary | ICD-10-CM | POA: Diagnosis not present

## 2018-08-14 DIAGNOSIS — Z85830 Personal history of malignant neoplasm of bone: Secondary | ICD-10-CM | POA: Diagnosis not present

## 2018-08-14 NOTE — Telephone Encounter (Signed)
Mickel Baas from [facility] calling for PT verbal orders as follows:  2 time(s) weekly for 4 week(s), then 1 time(s) weekly for 2 week(s)  You can leave verbal orders on confidential voicemail.  Christen Bame, CMA

## 2018-08-18 DIAGNOSIS — I679 Cerebrovascular disease, unspecified: Secondary | ICD-10-CM | POA: Diagnosis not present

## 2018-08-18 DIAGNOSIS — R69 Illness, unspecified: Secondary | ICD-10-CM | POA: Diagnosis not present

## 2018-08-18 DIAGNOSIS — Z85830 Personal history of malignant neoplasm of bone: Secondary | ICD-10-CM | POA: Diagnosis not present

## 2018-08-18 DIAGNOSIS — N183 Chronic kidney disease, stage 3 (moderate): Secondary | ICD-10-CM | POA: Diagnosis not present

## 2018-08-18 DIAGNOSIS — E1122 Type 2 diabetes mellitus with diabetic chronic kidney disease: Secondary | ICD-10-CM | POA: Diagnosis not present

## 2018-08-18 DIAGNOSIS — M48061 Spinal stenosis, lumbar region without neurogenic claudication: Secondary | ICD-10-CM | POA: Diagnosis not present

## 2018-08-18 DIAGNOSIS — E785 Hyperlipidemia, unspecified: Secondary | ICD-10-CM | POA: Diagnosis not present

## 2018-08-18 DIAGNOSIS — Z87440 Personal history of urinary (tract) infections: Secondary | ICD-10-CM | POA: Diagnosis not present

## 2018-08-18 DIAGNOSIS — I129 Hypertensive chronic kidney disease with stage 1 through stage 4 chronic kidney disease, or unspecified chronic kidney disease: Secondary | ICD-10-CM | POA: Diagnosis not present

## 2018-08-18 DIAGNOSIS — Z9181 History of falling: Secondary | ICD-10-CM | POA: Diagnosis not present

## 2018-08-19 ENCOUNTER — Encounter: Payer: Self-pay | Admitting: Podiatry

## 2018-08-19 ENCOUNTER — Ambulatory Visit (INDEPENDENT_AMBULATORY_CARE_PROVIDER_SITE_OTHER): Payer: Medicare HMO | Admitting: Podiatry

## 2018-08-19 ENCOUNTER — Other Ambulatory Visit: Payer: Self-pay

## 2018-08-19 VITALS — Temp 98.2°F

## 2018-08-19 DIAGNOSIS — E1159 Type 2 diabetes mellitus with other circulatory complications: Secondary | ICD-10-CM

## 2018-08-19 DIAGNOSIS — E785 Hyperlipidemia, unspecified: Secondary | ICD-10-CM | POA: Diagnosis not present

## 2018-08-19 DIAGNOSIS — Z85830 Personal history of malignant neoplasm of bone: Secondary | ICD-10-CM | POA: Diagnosis not present

## 2018-08-19 DIAGNOSIS — Z9181 History of falling: Secondary | ICD-10-CM | POA: Diagnosis not present

## 2018-08-19 DIAGNOSIS — B351 Tinea unguium: Secondary | ICD-10-CM

## 2018-08-19 DIAGNOSIS — E1142 Type 2 diabetes mellitus with diabetic polyneuropathy: Secondary | ICD-10-CM

## 2018-08-19 DIAGNOSIS — R69 Illness, unspecified: Secondary | ICD-10-CM | POA: Diagnosis not present

## 2018-08-19 DIAGNOSIS — E1122 Type 2 diabetes mellitus with diabetic chronic kidney disease: Secondary | ICD-10-CM | POA: Diagnosis not present

## 2018-08-19 DIAGNOSIS — M79675 Pain in left toe(s): Secondary | ICD-10-CM | POA: Diagnosis not present

## 2018-08-19 DIAGNOSIS — M79674 Pain in right toe(s): Secondary | ICD-10-CM | POA: Diagnosis not present

## 2018-08-19 DIAGNOSIS — M48061 Spinal stenosis, lumbar region without neurogenic claudication: Secondary | ICD-10-CM | POA: Diagnosis not present

## 2018-08-19 DIAGNOSIS — N183 Chronic kidney disease, stage 3 (moderate): Secondary | ICD-10-CM | POA: Diagnosis not present

## 2018-08-19 DIAGNOSIS — I129 Hypertensive chronic kidney disease with stage 1 through stage 4 chronic kidney disease, or unspecified chronic kidney disease: Secondary | ICD-10-CM | POA: Diagnosis not present

## 2018-08-19 DIAGNOSIS — Z87440 Personal history of urinary (tract) infections: Secondary | ICD-10-CM | POA: Diagnosis not present

## 2018-08-19 DIAGNOSIS — E1140 Type 2 diabetes mellitus with diabetic neuropathy, unspecified: Secondary | ICD-10-CM | POA: Insufficient documentation

## 2018-08-19 DIAGNOSIS — I679 Cerebrovascular disease, unspecified: Secondary | ICD-10-CM | POA: Diagnosis not present

## 2018-08-19 NOTE — Progress Notes (Signed)
Complaint:  Visit Type: Patient returns to my office for continued preventative foot care services. Complaint: Patient states" my nails have grown long and thick and become painful to walk and wear shoes" Patient has been diagnosed with DM with neuropathy angiopathy.. The patient presents for preventative foot care services. No changes to ROS  Podiatric Exam: Vascular: dorsalis pedis and posterior tibial pulses are not  palpable bilateral. Capillary return is immediate. Temperature gradient is WNL. Skin turgor WNL  Sensorium: Absent  Semmes Weinstein monofilament test. Normal tactile sensation bilaterally. Nail Exam: Pt has thick disfigured discolored nails with subungual debris noted bilateral entire nail hallux through fifth toenails Ulcer Exam: There is no evidence of ulcer or pre-ulcerative changes or infection. Orthopedic Exam: Muscle tone and strength are WNL. No limitations in general ROM. No crepitus or effusions noted. Foot type and digits show no abnormalities. Bony prominences are unremarkable. Skin: No Porokeratosis. No infection or ulcers  Diagnosis:  Onychomycosis, , Pain in right toe, pain in left toes  Treatment & Plan Procedures and Treatment: Consent by patient was obtained for treatment procedures.   Debridement of mycotic and hypertrophic toenails, 1 through 5 bilateral and clearing of subungual debris. No ulceration, no infection noted.  Return Visit-Office Procedure: Patient instructed to return to the office for a follow up visit 3 months for continued evaluation and treatment.    Gardiner Barefoot DPM

## 2018-08-21 DIAGNOSIS — Z87440 Personal history of urinary (tract) infections: Secondary | ICD-10-CM | POA: Diagnosis not present

## 2018-08-21 DIAGNOSIS — E785 Hyperlipidemia, unspecified: Secondary | ICD-10-CM | POA: Diagnosis not present

## 2018-08-21 DIAGNOSIS — R69 Illness, unspecified: Secondary | ICD-10-CM | POA: Diagnosis not present

## 2018-08-21 DIAGNOSIS — Z9181 History of falling: Secondary | ICD-10-CM | POA: Diagnosis not present

## 2018-08-21 DIAGNOSIS — N183 Chronic kidney disease, stage 3 (moderate): Secondary | ICD-10-CM | POA: Diagnosis not present

## 2018-08-21 DIAGNOSIS — M48061 Spinal stenosis, lumbar region without neurogenic claudication: Secondary | ICD-10-CM | POA: Diagnosis not present

## 2018-08-21 DIAGNOSIS — Z85830 Personal history of malignant neoplasm of bone: Secondary | ICD-10-CM | POA: Diagnosis not present

## 2018-08-21 DIAGNOSIS — I679 Cerebrovascular disease, unspecified: Secondary | ICD-10-CM | POA: Diagnosis not present

## 2018-08-21 DIAGNOSIS — E1122 Type 2 diabetes mellitus with diabetic chronic kidney disease: Secondary | ICD-10-CM | POA: Diagnosis not present

## 2018-08-21 DIAGNOSIS — I129 Hypertensive chronic kidney disease with stage 1 through stage 4 chronic kidney disease, or unspecified chronic kidney disease: Secondary | ICD-10-CM | POA: Diagnosis not present

## 2018-08-24 DIAGNOSIS — I129 Hypertensive chronic kidney disease with stage 1 through stage 4 chronic kidney disease, or unspecified chronic kidney disease: Secondary | ICD-10-CM | POA: Diagnosis not present

## 2018-08-24 DIAGNOSIS — R69 Illness, unspecified: Secondary | ICD-10-CM | POA: Diagnosis not present

## 2018-08-24 DIAGNOSIS — M48061 Spinal stenosis, lumbar region without neurogenic claudication: Secondary | ICD-10-CM | POA: Diagnosis not present

## 2018-08-24 DIAGNOSIS — Z87440 Personal history of urinary (tract) infections: Secondary | ICD-10-CM | POA: Diagnosis not present

## 2018-08-24 DIAGNOSIS — Z85830 Personal history of malignant neoplasm of bone: Secondary | ICD-10-CM | POA: Diagnosis not present

## 2018-08-24 DIAGNOSIS — N183 Chronic kidney disease, stage 3 (moderate): Secondary | ICD-10-CM | POA: Diagnosis not present

## 2018-08-24 DIAGNOSIS — I679 Cerebrovascular disease, unspecified: Secondary | ICD-10-CM | POA: Diagnosis not present

## 2018-08-24 DIAGNOSIS — E785 Hyperlipidemia, unspecified: Secondary | ICD-10-CM | POA: Diagnosis not present

## 2018-08-24 DIAGNOSIS — E1122 Type 2 diabetes mellitus with diabetic chronic kidney disease: Secondary | ICD-10-CM | POA: Diagnosis not present

## 2018-08-24 DIAGNOSIS — Z9181 History of falling: Secondary | ICD-10-CM | POA: Diagnosis not present

## 2018-08-26 DIAGNOSIS — E785 Hyperlipidemia, unspecified: Secondary | ICD-10-CM | POA: Diagnosis not present

## 2018-08-26 DIAGNOSIS — I129 Hypertensive chronic kidney disease with stage 1 through stage 4 chronic kidney disease, or unspecified chronic kidney disease: Secondary | ICD-10-CM | POA: Diagnosis not present

## 2018-08-26 DIAGNOSIS — Z87440 Personal history of urinary (tract) infections: Secondary | ICD-10-CM | POA: Diagnosis not present

## 2018-08-26 DIAGNOSIS — Z9181 History of falling: Secondary | ICD-10-CM | POA: Diagnosis not present

## 2018-08-26 DIAGNOSIS — E1122 Type 2 diabetes mellitus with diabetic chronic kidney disease: Secondary | ICD-10-CM | POA: Diagnosis not present

## 2018-08-26 DIAGNOSIS — I679 Cerebrovascular disease, unspecified: Secondary | ICD-10-CM | POA: Diagnosis not present

## 2018-08-26 DIAGNOSIS — N183 Chronic kidney disease, stage 3 (moderate): Secondary | ICD-10-CM | POA: Diagnosis not present

## 2018-08-26 DIAGNOSIS — M48061 Spinal stenosis, lumbar region without neurogenic claudication: Secondary | ICD-10-CM | POA: Diagnosis not present

## 2018-08-26 DIAGNOSIS — R69 Illness, unspecified: Secondary | ICD-10-CM | POA: Diagnosis not present

## 2018-08-26 DIAGNOSIS — Z85830 Personal history of malignant neoplasm of bone: Secondary | ICD-10-CM | POA: Diagnosis not present

## 2018-08-27 DIAGNOSIS — M48061 Spinal stenosis, lumbar region without neurogenic claudication: Secondary | ICD-10-CM | POA: Diagnosis not present

## 2018-08-27 DIAGNOSIS — E785 Hyperlipidemia, unspecified: Secondary | ICD-10-CM | POA: Diagnosis not present

## 2018-08-27 DIAGNOSIS — Z85830 Personal history of malignant neoplasm of bone: Secondary | ICD-10-CM | POA: Diagnosis not present

## 2018-08-27 DIAGNOSIS — N183 Chronic kidney disease, stage 3 (moderate): Secondary | ICD-10-CM | POA: Diagnosis not present

## 2018-08-27 DIAGNOSIS — I679 Cerebrovascular disease, unspecified: Secondary | ICD-10-CM | POA: Diagnosis not present

## 2018-08-27 DIAGNOSIS — R69 Illness, unspecified: Secondary | ICD-10-CM | POA: Diagnosis not present

## 2018-08-27 DIAGNOSIS — Z87440 Personal history of urinary (tract) infections: Secondary | ICD-10-CM | POA: Diagnosis not present

## 2018-08-27 DIAGNOSIS — E1122 Type 2 diabetes mellitus with diabetic chronic kidney disease: Secondary | ICD-10-CM | POA: Diagnosis not present

## 2018-08-27 DIAGNOSIS — I129 Hypertensive chronic kidney disease with stage 1 through stage 4 chronic kidney disease, or unspecified chronic kidney disease: Secondary | ICD-10-CM | POA: Diagnosis not present

## 2018-08-27 DIAGNOSIS — Z9181 History of falling: Secondary | ICD-10-CM | POA: Diagnosis not present

## 2018-08-31 DIAGNOSIS — N183 Chronic kidney disease, stage 3 (moderate): Secondary | ICD-10-CM | POA: Diagnosis not present

## 2018-08-31 DIAGNOSIS — M48061 Spinal stenosis, lumbar region without neurogenic claudication: Secondary | ICD-10-CM | POA: Diagnosis not present

## 2018-08-31 DIAGNOSIS — E1122 Type 2 diabetes mellitus with diabetic chronic kidney disease: Secondary | ICD-10-CM | POA: Diagnosis not present

## 2018-08-31 DIAGNOSIS — E785 Hyperlipidemia, unspecified: Secondary | ICD-10-CM | POA: Diagnosis not present

## 2018-08-31 DIAGNOSIS — Z87440 Personal history of urinary (tract) infections: Secondary | ICD-10-CM | POA: Diagnosis not present

## 2018-08-31 DIAGNOSIS — Z9181 History of falling: Secondary | ICD-10-CM | POA: Diagnosis not present

## 2018-08-31 DIAGNOSIS — I679 Cerebrovascular disease, unspecified: Secondary | ICD-10-CM | POA: Diagnosis not present

## 2018-08-31 DIAGNOSIS — R69 Illness, unspecified: Secondary | ICD-10-CM | POA: Diagnosis not present

## 2018-08-31 DIAGNOSIS — I129 Hypertensive chronic kidney disease with stage 1 through stage 4 chronic kidney disease, or unspecified chronic kidney disease: Secondary | ICD-10-CM | POA: Diagnosis not present

## 2018-08-31 DIAGNOSIS — Z85830 Personal history of malignant neoplasm of bone: Secondary | ICD-10-CM | POA: Diagnosis not present

## 2018-09-02 DIAGNOSIS — Z87440 Personal history of urinary (tract) infections: Secondary | ICD-10-CM | POA: Diagnosis not present

## 2018-09-02 DIAGNOSIS — E785 Hyperlipidemia, unspecified: Secondary | ICD-10-CM | POA: Diagnosis not present

## 2018-09-02 DIAGNOSIS — I679 Cerebrovascular disease, unspecified: Secondary | ICD-10-CM | POA: Diagnosis not present

## 2018-09-02 DIAGNOSIS — R69 Illness, unspecified: Secondary | ICD-10-CM | POA: Diagnosis not present

## 2018-09-02 DIAGNOSIS — Z9181 History of falling: Secondary | ICD-10-CM | POA: Diagnosis not present

## 2018-09-02 DIAGNOSIS — Z85830 Personal history of malignant neoplasm of bone: Secondary | ICD-10-CM | POA: Diagnosis not present

## 2018-09-02 DIAGNOSIS — I129 Hypertensive chronic kidney disease with stage 1 through stage 4 chronic kidney disease, or unspecified chronic kidney disease: Secondary | ICD-10-CM | POA: Diagnosis not present

## 2018-09-02 DIAGNOSIS — M48061 Spinal stenosis, lumbar region without neurogenic claudication: Secondary | ICD-10-CM | POA: Diagnosis not present

## 2018-09-02 DIAGNOSIS — N183 Chronic kidney disease, stage 3 (moderate): Secondary | ICD-10-CM | POA: Diagnosis not present

## 2018-09-02 DIAGNOSIS — E1122 Type 2 diabetes mellitus with diabetic chronic kidney disease: Secondary | ICD-10-CM | POA: Diagnosis not present

## 2018-09-03 NOTE — Telephone Encounter (Signed)
I thought I did-- but just called again. She couldn't remember if I called them in either. Now they are.  Thank you!!!  Apolonio Schneiders

## 2018-09-03 NOTE — Telephone Encounter (Signed)
Were these called in?  Mary Walters, CMA  

## 2018-09-04 ENCOUNTER — Encounter (INDEPENDENT_AMBULATORY_CARE_PROVIDER_SITE_OTHER): Payer: Self-pay

## 2018-09-04 ENCOUNTER — Other Ambulatory Visit (INDEPENDENT_AMBULATORY_CARE_PROVIDER_SITE_OTHER): Payer: Medicare HMO

## 2018-09-04 ENCOUNTER — Encounter: Payer: Self-pay | Admitting: Gastroenterology

## 2018-09-04 ENCOUNTER — Ambulatory Visit (INDEPENDENT_AMBULATORY_CARE_PROVIDER_SITE_OTHER): Payer: Medicare HMO | Admitting: Gastroenterology

## 2018-09-04 VITALS — BP 140/82 | HR 73 | Temp 96.7°F | Ht 60.0 in | Wt 133.0 lb

## 2018-09-04 DIAGNOSIS — R748 Abnormal levels of other serum enzymes: Secondary | ICD-10-CM

## 2018-09-04 LAB — HEPATIC FUNCTION PANEL
ALT: 11 U/L (ref 0–35)
AST: 19 U/L (ref 0–37)
Albumin: 4.2 g/dL (ref 3.5–5.2)
Alkaline Phosphatase: 113 U/L (ref 39–117)
Bilirubin, Direct: 0.1 mg/dL (ref 0.0–0.3)
Total Bilirubin: 0.4 mg/dL (ref 0.2–1.2)
Total Protein: 7.8 g/dL (ref 6.0–8.3)

## 2018-09-04 LAB — IGA: IgA: 113 mg/dL (ref 68–378)

## 2018-09-04 LAB — PROTIME-INR
INR: 1.1 ratio — ABNORMAL HIGH (ref 0.8–1.0)
Prothrombin Time: 12.9 s (ref 9.6–13.1)

## 2018-09-04 NOTE — Patient Instructions (Signed)
If you are age 85 or older, your body mass index should be between 23-30. Your Body mass index is 25.97 kg/m. If this is out of the aforementioned range listed, please consider follow up with your Primary Care Provider.  Your provider has requested that you go to the basement level for lab work before leaving today. Press "B" on the elevator. The lab is located at the first door on the left as you exit the elevator.  Thank you for choosing me and [facility].  Janett Billow Zehr-PA

## 2018-09-04 NOTE — Progress Notes (Signed)
I agree with repeating LFTs and checking serologies, in particular AMA to assess for PBC. Drug induced liver injury also possible if any new exposures. If the AP elevation persists and lab workup negative, would then consider cross sectional imaging of the liver with MRI / MRCP.

## 2018-09-04 NOTE — Progress Notes (Signed)
09/04/2018 Mary Walters [id number] 10/29/1928   HISTORY OF PRESENT ILLNESS: This is an 85 year old female who was referred here by her PCP, Dr. Dorris Singh, for evaluation regarding elevated alkaline phosphatase level.  It appears that this is been elevated dating back to March of this year which is as far back as we have labs available.  At that time it was elevated at 542, but has continued to trend down and in June was 279.  All other LFTs normal.  She is here today with her daughter-in-law.  The first that they were aware of this elevation was in June.  She denies any complaints of abdominal pain.  She says that she does not eat like she used to, but she denies any weight loss.  No complaints today.  She had an ultrasound performed in June that showed normal liver parenchyma with multiple lesions present the they were indicating were likely cysts.  GGT is elevated at 227.  Hepatitis C antibody negative.   Past Medical History:  Diagnosis Date  . Diabetes mellitus without complication ([place])   . HLD (hyperlipidemia) 06/02/2017  . Hypertension   . 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 Urge incontinence 09/05/2017  . Vascular dementia of acute onset without behavioral disturbance ([place]) 09/05/2017   History reviewed. No pertinent surgical history.  reports that she has never smoked. She has never used smokeless tobacco. She reports current alcohol use of about 2.0 standard drinks of alcohol per week. She reports that she does not use drugs. family history includes Hypertension in her mother. No Known Allergies    Outpatient Encounter Medications as of 09/04/2018  Medication Sig  . acetaminophen (TYLENOL) 325 MG tablet Take 650 mg by mouth every 6 (six) hours as needed.  Marland Kitchen amLODipine (NORVASC) 2.5 MG tablet Take 1 tablet (2.5 mg total) by mouth at bedtime.  Marland Kitchen aspirin EC 81 MG tablet Take 81 mg by mouth daily.  Marland Kitchen atorvastatin (LIPITOR) 20 MG tablet Take 1 tablet (20 mg total) by mouth  daily.  . Blood Glucose Monitoring Suppl (ONETOUCH VERIO FLEX SYSTEM) w/Device KIT Check once daily in the morning before breakfast  . candesartan (ATACAND) 8 MG tablet Take 1 tablet (8 mg total) by mouth at bedtime.  . Cholecalciferol (VITAMIN D) 50 MCG (2000 UT) tablet Take 2,000 Units by mouth daily.  Marland Kitchen glucose blood test strip Use as instructed  . Lancet Devices (ONE TOUCH DELICA LANCING DEV) MISC 1 Device by Does not apply route daily as needed.  Glory Rosebush Delica Lancets 83M MISC 1 Units by Does not apply route daily as needed.   No facility-administered encounter medications on file as of 09/04/2018.      REVIEW OF SYSTEMS  : All other systems reviewed and negative except where noted in the History of Present Illness.   PHYSICAL EXAM: BP 140/82   Pulse 73   Temp (!) 96.7 F (35.9 C)   Ht 5' (1.524 m)   Wt 133 lb (60.3 kg)   SpO2 98%   BMI 25.97 kg/m  General: Well developed black female in no acute distress; uses walker Head: Normocephalic and atraumatic Eyes:  Sclerae anicteric, conjunctiva pink. Ears: Normal auditory acuity Lungs: Clear throughout to auscultation; no increased WOB. Heart: Regular rate and rhythm; no M/R/G. Abdomen: Soft, non-distended.  BS present.  Non-tender. Musculoskeletal: Symmetrical with no gross deformities  Skin: No lesions on visible extremities Extremities: No edema  Neurological: Alert oriented x 4, grossly non-focal Psychological:  Alert and cooperative. Normal mood and affect  ASSESSMENT AND PLAN: *85 year old female with elevated ALP.  Dating back to March is as far back as I have lab results.  As high as 542, but have actually trended down since then to 279 in June.  GGT is elevated.  Hep C antibody negative.  Ultrasound shows normal liver parenchyma, but has multiple liver lesions, likely cysts.  No GI complaints.  Will check some labs including viral hepatitis B studies, PT/INR, repeat LFTs, ANA, AMA, anti-smooth muscle antibody, IgG.   At 85 years old likely will not need to be too concerned about this at this point, but will follow-up results.  CC:  Martyn Malay, MD

## 2018-09-06 ENCOUNTER — Other Ambulatory Visit: Payer: Self-pay | Admitting: Family Medicine

## 2018-09-06 DIAGNOSIS — I152 Hypertension secondary to endocrine disorders: Secondary | ICD-10-CM

## 2018-09-06 DIAGNOSIS — E1159 Type 2 diabetes mellitus with other circulatory complications: Secondary | ICD-10-CM

## 2018-09-07 DIAGNOSIS — Z87440 Personal history of urinary (tract) infections: Secondary | ICD-10-CM | POA: Diagnosis not present

## 2018-09-07 DIAGNOSIS — N183 Chronic kidney disease, stage 3 (moderate): Secondary | ICD-10-CM | POA: Diagnosis not present

## 2018-09-07 DIAGNOSIS — E785 Hyperlipidemia, unspecified: Secondary | ICD-10-CM | POA: Diagnosis not present

## 2018-09-07 DIAGNOSIS — I129 Hypertensive chronic kidney disease with stage 1 through stage 4 chronic kidney disease, or unspecified chronic kidney disease: Secondary | ICD-10-CM | POA: Diagnosis not present

## 2018-09-07 DIAGNOSIS — I679 Cerebrovascular disease, unspecified: Secondary | ICD-10-CM | POA: Diagnosis not present

## 2018-09-07 DIAGNOSIS — Z85830 Personal history of malignant neoplasm of bone: Secondary | ICD-10-CM | POA: Diagnosis not present

## 2018-09-07 DIAGNOSIS — Z9181 History of falling: Secondary | ICD-10-CM | POA: Diagnosis not present

## 2018-09-07 DIAGNOSIS — R69 Illness, unspecified: Secondary | ICD-10-CM | POA: Diagnosis not present

## 2018-09-07 DIAGNOSIS — E1122 Type 2 diabetes mellitus with diabetic chronic kidney disease: Secondary | ICD-10-CM | POA: Diagnosis not present

## 2018-09-07 DIAGNOSIS — M48061 Spinal stenosis, lumbar region without neurogenic claudication: Secondary | ICD-10-CM | POA: Diagnosis not present

## 2018-09-08 ENCOUNTER — Telehealth: Payer: Self-pay | Admitting: *Deleted

## 2018-09-08 DIAGNOSIS — Z87440 Personal history of urinary (tract) infections: Secondary | ICD-10-CM | POA: Diagnosis not present

## 2018-09-08 DIAGNOSIS — I679 Cerebrovascular disease, unspecified: Secondary | ICD-10-CM | POA: Diagnosis not present

## 2018-09-08 DIAGNOSIS — E1122 Type 2 diabetes mellitus with diabetic chronic kidney disease: Secondary | ICD-10-CM | POA: Diagnosis not present

## 2018-09-08 DIAGNOSIS — E785 Hyperlipidemia, unspecified: Secondary | ICD-10-CM | POA: Diagnosis not present

## 2018-09-08 DIAGNOSIS — Z9181 History of falling: Secondary | ICD-10-CM | POA: Diagnosis not present

## 2018-09-08 DIAGNOSIS — R69 Illness, unspecified: Secondary | ICD-10-CM | POA: Diagnosis not present

## 2018-09-08 DIAGNOSIS — M48061 Spinal stenosis, lumbar region without neurogenic claudication: Secondary | ICD-10-CM | POA: Diagnosis not present

## 2018-09-08 DIAGNOSIS — N183 Chronic kidney disease, stage 3 (moderate): Secondary | ICD-10-CM | POA: Diagnosis not present

## 2018-09-08 DIAGNOSIS — I129 Hypertensive chronic kidney disease with stage 1 through stage 4 chronic kidney disease, or unspecified chronic kidney disease: Secondary | ICD-10-CM | POA: Diagnosis not present

## 2018-09-08 DIAGNOSIS — Z85830 Personal history of malignant neoplasm of bone: Secondary | ICD-10-CM | POA: Diagnosis not present

## 2018-09-08 LAB — HEPATITIS B SURFACE ANTIGEN: Hepatitis B Surface Ag: NONREACTIVE

## 2018-09-08 LAB — ANTI-SMOOTH MUSCLE ANTIBODY, IGG: Actin (Smooth Muscle) Antibody (IGG): <20 U (ref <20)

## 2018-09-08 LAB — MITOCHONDRIAL ANTIBODIES: Mitochondrial M2 Ab, IgG: <=20 U

## 2018-09-08 LAB — HEPATITIS B SURFACE ANTIBODY,QUALITATIVE: Hep B S Ab: NONREACTIVE

## 2018-09-08 LAB — ANA: Anti Nuclear Antibody (ANA): NEGATIVE

## 2018-09-08 NOTE — Telephone Encounter (Signed)
Mickel Baas calls to let PCP know that pt is being discharged from [facility] due to "reaching the Max potential due to cognitive abilities". Christen Bame, CMA

## 2018-09-14 ENCOUNTER — Other Ambulatory Visit: Payer: Self-pay | Admitting: Family Medicine

## 2018-09-14 ENCOUNTER — Other Ambulatory Visit: Payer: Self-pay

## 2018-09-14 ENCOUNTER — Telehealth: Payer: Self-pay | Admitting: Family Medicine

## 2018-09-14 DIAGNOSIS — Z20828 Contact with and (suspected) exposure to other viral communicable diseases: Secondary | ICD-10-CM

## 2018-09-14 DIAGNOSIS — R6889 Other general symptoms and signs: Secondary | ICD-10-CM | POA: Diagnosis not present

## 2018-09-14 DIAGNOSIS — Z20822 Contact with and (suspected) exposure to covid-19: Secondary | ICD-10-CM

## 2018-09-14 NOTE — Telephone Encounter (Signed)
Received message that patient has had close contact contact with person with coronavirus 19+ test.  The contact is asymptomatic.  Testing ordered for patient as she is considered high risk.

## 2018-09-16 LAB — NOVEL CORONAVIRUS, NAA: SARS-CoV-2, NAA: NOT DETECTED

## 2018-09-17 ENCOUNTER — Other Ambulatory Visit: Payer: Self-pay

## 2018-09-17 DIAGNOSIS — R208 Other disturbances of skin sensation: Secondary | ICD-10-CM | POA: Diagnosis not present

## 2018-09-17 DIAGNOSIS — I679 Cerebrovascular disease, unspecified: Secondary | ICD-10-CM | POA: Diagnosis not present

## 2018-09-17 DIAGNOSIS — Z85830 Personal history of malignant neoplasm of bone: Secondary | ICD-10-CM | POA: Diagnosis not present

## 2018-09-17 DIAGNOSIS — I129 Hypertensive chronic kidney disease with stage 1 through stage 4 chronic kidney disease, or unspecified chronic kidney disease: Secondary | ICD-10-CM | POA: Diagnosis not present

## 2018-09-17 DIAGNOSIS — F0150 Vascular dementia without behavioral disturbance: Secondary | ICD-10-CM

## 2018-09-17 DIAGNOSIS — E1122 Type 2 diabetes mellitus with diabetic chronic kidney disease: Secondary | ICD-10-CM | POA: Diagnosis not present

## 2018-09-17 DIAGNOSIS — M48061 Spinal stenosis, lumbar region without neurogenic claudication: Secondary | ICD-10-CM | POA: Diagnosis not present

## 2018-09-17 DIAGNOSIS — Z9181 History of falling: Secondary | ICD-10-CM | POA: Diagnosis not present

## 2018-09-17 DIAGNOSIS — E785 Hyperlipidemia, unspecified: Secondary | ICD-10-CM | POA: Diagnosis not present

## 2018-09-17 DIAGNOSIS — R69 Illness, unspecified: Secondary | ICD-10-CM | POA: Diagnosis not present

## 2018-09-17 DIAGNOSIS — E1140 Type 2 diabetes mellitus with diabetic neuropathy, unspecified: Secondary | ICD-10-CM | POA: Diagnosis not present

## 2018-09-17 DIAGNOSIS — Z87440 Personal history of urinary (tract) infections: Secondary | ICD-10-CM | POA: Diagnosis not present

## 2018-09-17 DIAGNOSIS — N183 Chronic kidney disease, stage 3 (moderate): Secondary | ICD-10-CM | POA: Diagnosis not present

## 2018-09-18 MED ORDER — ATORVASTATIN CALCIUM 20 MG PO TABS: 20.0000 mg | ORAL_TABLET | Freq: Every day | ORAL | 3 refills | Status: DC

## 2018-09-22 DIAGNOSIS — Z85830 Personal history of malignant neoplasm of bone: Secondary | ICD-10-CM | POA: Diagnosis not present

## 2018-09-22 DIAGNOSIS — E1122 Type 2 diabetes mellitus with diabetic chronic kidney disease: Secondary | ICD-10-CM | POA: Diagnosis not present

## 2018-09-22 DIAGNOSIS — M48061 Spinal stenosis, lumbar region without neurogenic claudication: Secondary | ICD-10-CM | POA: Diagnosis not present

## 2018-09-22 DIAGNOSIS — E785 Hyperlipidemia, unspecified: Secondary | ICD-10-CM | POA: Diagnosis not present

## 2018-09-22 DIAGNOSIS — Z87440 Personal history of urinary (tract) infections: Secondary | ICD-10-CM | POA: Diagnosis not present

## 2018-09-22 DIAGNOSIS — R69 Illness, unspecified: Secondary | ICD-10-CM | POA: Diagnosis not present

## 2018-09-22 DIAGNOSIS — Z9181 History of falling: Secondary | ICD-10-CM | POA: Diagnosis not present

## 2018-09-22 DIAGNOSIS — I129 Hypertensive chronic kidney disease with stage 1 through stage 4 chronic kidney disease, or unspecified chronic kidney disease: Secondary | ICD-10-CM | POA: Diagnosis not present

## 2018-09-22 DIAGNOSIS — N183 Chronic kidney disease, stage 3 (moderate): Secondary | ICD-10-CM | POA: Diagnosis not present

## 2018-09-22 DIAGNOSIS — I679 Cerebrovascular disease, unspecified: Secondary | ICD-10-CM | POA: Diagnosis not present

## 2018-09-27 ENCOUNTER — Other Ambulatory Visit: Payer: Self-pay | Admitting: Family Medicine

## 2018-09-27 DIAGNOSIS — E559 Vitamin D deficiency, unspecified: Secondary | ICD-10-CM

## 2018-09-30 ENCOUNTER — Telehealth: Payer: Self-pay | Admitting: Family Medicine

## 2018-09-30 NOTE — Telephone Encounter (Signed)
Pt's daughter in law is calling and would like to speak with Dr. Owens Shark concerning the patient.   The best call back number is [phone]

## 2018-09-30 NOTE — Telephone Encounter (Signed)
Called patient's daughter-in-law Sunday Spillers.  The patient woke up this morning and was a little bit more confused than usual.  Specifically she was stating she wanted to go back to her apartment and was very worried while she was out there.  She denies any pain.  She has had a strong odor to her urine but no dysuria.  No hematuria.  She is otherwise doing well.  She is eating and drinking normally she is back to her baseline at this time.  Discussed with Dr. Nori Riis.  Will discuss with Dr. McDiarmid as well.  May consider as needed.  Risperdal or other medication for this.  Will check in tomorrow on patient's status.  Patient's granddaughter is to take vitals tonight.  And also collect a urine sample and specimen cup.  Dorris Singh, MD  Family Medicine Teaching Service

## 2018-10-01 ENCOUNTER — Telehealth: Payer: Self-pay | Admitting: Family Medicine

## 2018-10-01 NOTE — Telephone Encounter (Signed)
Spoke to [employer] over the phone and confirmed urine culture order.  She will fax [place] back the results when they are available.  Wilber Oliphant, M.D.  PGY-2  Family Medicine  [phone] 10/01/2018 3:30 PM

## 2018-10-01 NOTE — Telephone Encounter (Signed)
Mary Walters, [phone] received a text regarding a question from [place] about orders (?)  Please call her back to discuss, thanks.

## 2018-10-01 NOTE — Telephone Encounter (Signed)
Called home health agency. Requested urine specimen. Nurse from [facility] is to call back. If she does, please request urine culture be collected.   Let me know if they have questions.  Thanks!

## 2018-10-01 NOTE — Telephone Encounter (Signed)
Called daughter in law to check in. Ms. Condrey has had some increasing urinary frequency. No fevers, chills, nausea, vomiting. No change in behavior this AM. Will send urine collection container home with granddaughter for collection.   Reviewed return precautions.  Dorris Singh, MD  Family Medicine Teaching Service

## 2018-10-08 ENCOUNTER — Other Ambulatory Visit: Payer: Self-pay | Admitting: Family Medicine

## 2018-10-08 DIAGNOSIS — N3941 Urge incontinence: Secondary | ICD-10-CM

## 2018-10-08 NOTE — Progress Notes (Signed)
Urine culture ordered as future given symptoms.   Dorris Singh, MD  Family Medicine Teaching Service

## 2018-10-09 DIAGNOSIS — Z9181 History of falling: Secondary | ICD-10-CM | POA: Diagnosis not present

## 2018-10-09 DIAGNOSIS — M48061 Spinal stenosis, lumbar region without neurogenic claudication: Secondary | ICD-10-CM | POA: Diagnosis not present

## 2018-10-09 DIAGNOSIS — E1122 Type 2 diabetes mellitus with diabetic chronic kidney disease: Secondary | ICD-10-CM | POA: Diagnosis not present

## 2018-10-09 DIAGNOSIS — R69 Illness, unspecified: Secondary | ICD-10-CM | POA: Diagnosis not present

## 2018-10-09 DIAGNOSIS — I129 Hypertensive chronic kidney disease with stage 1 through stage 4 chronic kidney disease, or unspecified chronic kidney disease: Secondary | ICD-10-CM | POA: Diagnosis not present

## 2018-10-09 DIAGNOSIS — N183 Chronic kidney disease, stage 3 (moderate): Secondary | ICD-10-CM | POA: Diagnosis not present

## 2018-10-09 DIAGNOSIS — Z87440 Personal history of urinary (tract) infections: Secondary | ICD-10-CM | POA: Diagnosis not present

## 2018-10-09 DIAGNOSIS — Z85830 Personal history of malignant neoplasm of bone: Secondary | ICD-10-CM | POA: Diagnosis not present

## 2018-10-09 DIAGNOSIS — E785 Hyperlipidemia, unspecified: Secondary | ICD-10-CM | POA: Diagnosis not present

## 2018-10-09 DIAGNOSIS — I679 Cerebrovascular disease, unspecified: Secondary | ICD-10-CM | POA: Diagnosis not present

## 2018-10-13 DIAGNOSIS — M48061 Spinal stenosis, lumbar region without neurogenic claudication: Secondary | ICD-10-CM | POA: Diagnosis not present

## 2018-10-13 DIAGNOSIS — E1122 Type 2 diabetes mellitus with diabetic chronic kidney disease: Secondary | ICD-10-CM | POA: Diagnosis not present

## 2018-10-13 DIAGNOSIS — I679 Cerebrovascular disease, unspecified: Secondary | ICD-10-CM | POA: Diagnosis not present

## 2018-10-13 DIAGNOSIS — N183 Chronic kidney disease, stage 3 (moderate): Secondary | ICD-10-CM | POA: Diagnosis not present

## 2018-10-13 DIAGNOSIS — I129 Hypertensive chronic kidney disease with stage 1 through stage 4 chronic kidney disease, or unspecified chronic kidney disease: Secondary | ICD-10-CM | POA: Diagnosis not present

## 2018-10-13 DIAGNOSIS — Z87440 Personal history of urinary (tract) infections: Secondary | ICD-10-CM | POA: Diagnosis not present

## 2018-10-13 DIAGNOSIS — E785 Hyperlipidemia, unspecified: Secondary | ICD-10-CM | POA: Diagnosis not present

## 2018-10-13 DIAGNOSIS — Z85830 Personal history of malignant neoplasm of bone: Secondary | ICD-10-CM | POA: Diagnosis not present

## 2018-10-13 DIAGNOSIS — Z9181 History of falling: Secondary | ICD-10-CM | POA: Diagnosis not present

## 2018-10-13 DIAGNOSIS — R69 Illness, unspecified: Secondary | ICD-10-CM | POA: Diagnosis not present

## 2018-10-14 NOTE — Progress Notes (Signed)
I just received results in my physical inbox. It was collected on 10/09/18 and was normal. I am putting in into batch scanning.

## 2018-11-11 ENCOUNTER — Encounter: Payer: Self-pay | Admitting: Family Medicine

## 2018-11-20 ENCOUNTER — Ambulatory Visit: Payer: Medicare HMO | Admitting: Podiatry

## 2018-12-22 ENCOUNTER — Ambulatory Visit: Payer: Medicare HMO | Admitting: Podiatry

## 2019-01-01 ENCOUNTER — Other Ambulatory Visit: Payer: Self-pay

## 2019-01-01 DIAGNOSIS — I152 Hypertension secondary to endocrine disorders: Secondary | ICD-10-CM

## 2019-01-01 DIAGNOSIS — E1159 Type 2 diabetes mellitus with other circulatory complications: Secondary | ICD-10-CM

## 2019-01-01 NOTE — Telephone Encounter (Signed)
The request for Candesartan Cilexetil is for 4mg  tabs. It appears that the previous Rx was for 8mg  tabs. Ottis Stain, CMA

## 2019-01-03 NOTE — Telephone Encounter (Signed)
Refill not appropriate.  This was refilled by Dr. Andria Frames 5 months ago and she should have refills remaining.  Please call this patient and ask her to  schedule virtually or in person (preferred) for chronic disease follow-up as she has not been seen in about 6 months.

## 2019-01-14 ENCOUNTER — Encounter: Payer: Self-pay | Admitting: Podiatry

## 2019-01-14 ENCOUNTER — Ambulatory Visit: Payer: Medicare HMO | Admitting: Podiatry

## 2019-01-14 ENCOUNTER — Other Ambulatory Visit: Payer: Self-pay

## 2019-01-14 DIAGNOSIS — E1142 Type 2 diabetes mellitus with diabetic polyneuropathy: Secondary | ICD-10-CM

## 2019-01-14 DIAGNOSIS — M79674 Pain in right toe(s): Secondary | ICD-10-CM

## 2019-01-14 DIAGNOSIS — B351 Tinea unguium: Secondary | ICD-10-CM | POA: Diagnosis not present

## 2019-01-14 DIAGNOSIS — E1159 Type 2 diabetes mellitus with other circulatory complications: Secondary | ICD-10-CM | POA: Diagnosis not present

## 2019-01-14 DIAGNOSIS — M79675 Pain in left toe(s): Secondary | ICD-10-CM | POA: Diagnosis not present

## 2019-01-14 NOTE — Progress Notes (Addendum)
Complaint:  Visit Type: Patient returns to my office for continued preventative foot care services. Complaint: Patient states" my nails have grown long and thick and become painful to walk and wear shoes" Patient has been diagnosed with DM with neuropathy angiopathy.. The patient presents for preventative foot care services. No changes to ROS.  Patient presents to the office with her granddaughter.  Podiatric Exam: Vascular: dorsalis pedis and posterior tibial pulses are not  palpable bilateral. Capillary return is immediate. Temperature gradient is WNL. Skin turgor WNL  Sensorium: Absent  Semmes Weinstein monofilament test. Normal tactile sensation bilaterally. Nail Exam: Pt has thick disfigured discolored nails with subungual debris noted bilateral entire nail hallux through fifth toenails Ulcer Exam: There is no evidence of ulcer or pre-ulcerative changes or infection. Orthopedic Exam: Muscle tone and strength are WNL. No limitations in general ROM. No crepitus or effusions noted. Foot type and digits show no abnormalities. Bony prominences are unremarkable. Skin: No Porokeratosis. No infection or ulcers  Diagnosis:  Onychomycosis, , Pain in right toe, pain in left toes  Treatment & Plan Procedures and Treatment: Consent by patient was obtained for treatment procedures.   Debridement of mycotic and hypertrophic toenails, 1 through 5 bilateral and clearing of subungual debris. No ulceration, no infection noted.  Return Visit-Office Procedure: Patient instructed to return to the office for a follow up visit 3 months for continued evaluation and treatment.    Gardiner Barefoot DPM

## 2019-01-19 ENCOUNTER — Encounter: Payer: Self-pay | Admitting: Family Medicine

## 2019-01-19 ENCOUNTER — Telehealth: Payer: Self-pay | Admitting: Family Medicine

## 2019-01-19 NOTE — Telephone Encounter (Signed)
Called patient's caregiver, [name], back regarding family's request.  Patient has had about a week of constipation.  She finally had a hard bowel movement yesterday.  There is a small amount of bright red blood.  No further bleeding this is just on the tissue paper.  The patient's appetite has gone down over the past several days, no abdominal pain vomiting or fevers.  She is otherwise acting like her usual self.  Recommended MiraLAX twice a day.  They will pick up from the pharmacy tonight.  Instructed family members to call back if no improvement in appetite or other changes.  Terisa Starr, MD  Family Medicine Teaching Service

## 2019-02-18 ENCOUNTER — Other Ambulatory Visit: Payer: Self-pay

## 2019-02-18 ENCOUNTER — Encounter (HOSPITAL_COMMUNITY): Payer: Self-pay | Admitting: Emergency Medicine

## 2019-02-18 ENCOUNTER — Emergency Department (HOSPITAL_COMMUNITY): Payer: Medicare HMO

## 2019-02-18 ENCOUNTER — Emergency Department (HOSPITAL_COMMUNITY)
Admission: EM | Admit: 2019-02-18 | Discharge: 2019-02-18 | Disposition: A | Discharge status: Home / Self Care | Payer: Medicare HMO | Attending: Emergency Medicine | Admitting: Emergency Medicine

## 2019-02-18 DIAGNOSIS — R1084 Generalized abdominal pain: Secondary | ICD-10-CM | POA: Diagnosis not present

## 2019-02-18 DIAGNOSIS — E1122 Type 2 diabetes mellitus with diabetic chronic kidney disease: Secondary | ICD-10-CM | POA: Insufficient documentation

## 2019-02-18 DIAGNOSIS — R1111 Vomiting without nausea: Secondary | ICD-10-CM | POA: Diagnosis not present

## 2019-02-18 DIAGNOSIS — R55 Syncope and collapse: Secondary | ICD-10-CM | POA: Diagnosis not present

## 2019-02-18 DIAGNOSIS — R531 Weakness: Secondary | ICD-10-CM | POA: Insufficient documentation

## 2019-02-18 DIAGNOSIS — R1030 Lower abdominal pain, unspecified: Secondary | ICD-10-CM | POA: Diagnosis present

## 2019-02-18 DIAGNOSIS — N183 Chronic kidney disease, stage 3 unspecified: Secondary | ICD-10-CM | POA: Diagnosis not present

## 2019-02-18 DIAGNOSIS — Z79899 Other long term (current) drug therapy: Secondary | ICD-10-CM | POA: Insufficient documentation

## 2019-02-18 DIAGNOSIS — K5900 Constipation, unspecified: Secondary | ICD-10-CM | POA: Diagnosis not present

## 2019-02-18 DIAGNOSIS — Z8673 Personal history of transient ischemic attack (TIA), and cerebral infarction without residual deficits: Secondary | ICD-10-CM | POA: Diagnosis not present

## 2019-02-18 DIAGNOSIS — I129 Hypertensive chronic kidney disease with stage 1 through stage 4 chronic kidney disease, or unspecified chronic kidney disease: Secondary | ICD-10-CM | POA: Insufficient documentation

## 2019-02-18 LAB — COMPREHENSIVE METABOLIC PANEL
ALT: 14 U/L (ref 0–44)
AST: 22 U/L (ref 15–41)
Albumin: 3.9 g/dL (ref 3.5–5.0)
Alkaline Phosphatase: 81 U/L (ref 38–126)
Anion gap: 11 (ref 5–15)
BUN: 15 mg/dL (ref 8–23)
CO2: 25 mmol/L (ref 22–32)
Calcium: 9.7 mg/dL (ref 8.9–10.3)
Chloride: 107 mmol/L (ref 98–111)
Creatinine, Ser: 0.86 mg/dL (ref 0.44–1.00)
GFR calc Af Amer: >60 mL/min (ref >60)
GFR calc non Af Amer: >60 mL/min (ref >60)
Glucose, Bld: 165 mg/dL — ABNORMAL HIGH (ref 70–99)
Potassium: 3.6 mmol/L (ref 3.5–5.1)
Sodium: 143 mmol/L (ref 135–145)
Total Bilirubin: 0.9 mg/dL (ref 0.3–1.2)
Total Protein: 7.3 g/dL (ref 6.5–8.1)

## 2019-02-18 LAB — CBC
HCT: 43.7 % (ref 36.0–46.0)
Hemoglobin: 13.7 g/dL (ref 12.0–15.0)
MCH: 27.1 pg (ref 26.0–34.0)
MCHC: 31.4 g/dL (ref 30.0–36.0)
MCV: 86.5 fL (ref 80.0–100.0)
Platelets: 237 10*3/uL (ref 150–400)
RBC: 5.05 MIL/uL (ref 3.87–5.11)
RDW: 14.2 % (ref 11.5–15.5)
WBC: 9 10*3/uL (ref 4.0–10.5)
nRBC: 0 % (ref 0.0–0.2)

## 2019-02-18 LAB — URINALYSIS, ROUTINE W REFLEX MICROSCOPIC
Bilirubin Urine: NEGATIVE
Glucose, UA: NEGATIVE mg/dL
Hgb urine dipstick: NEGATIVE
Ketones, ur: 5 mg/dL — AB
Leukocytes,Ua: NEGATIVE
Nitrite: NEGATIVE
Protein, ur: NEGATIVE mg/dL
Specific Gravity, Urine: 1.024 (ref 1.005–1.030)
pH: 5 (ref 5.0–8.0)

## 2019-02-18 LAB — TROPONIN I (HIGH SENSITIVITY): Troponin I (High Sensitivity): 7 ng/L (ref <18)

## 2019-02-18 LAB — POC OCCULT BLOOD, ED: Fecal Occult Bld: NEGATIVE

## 2019-02-18 NOTE — ED Provider Notes (Signed)
[facility] EMERGENCY DEPARTMENT Provider Note   CSN: [id number] Arrival date & time: 02/18/19  1332     History Chief Complaint  Patient presents with   Weakness    Mary Walters is a 86 y.o. female.  HPI Patient gets assistance from family members in her home.  Patient is typically alert and interactive.  Appetite waxes and wanes.  Patient denies that she is typically in pain.  She usually sleeps in fairly late.  This morning, her daughter came down to get her up and showered and dressed around 11-ish.  She reports they went into the bathroom and the patient was trying to have a bowel movement and her daughter was helping her with an enema.  She reports it was painful so she stopped and they went on to finish bathing.  She then got her back to her recliner chair and settled.  She had brought her salad to eat.  A little while later she heard her making some unusual noises.  Her daughter went down to check on her and found that she was tilted somewhat toward the left and kind of putting her head and shoulders back and holding her lower abdomen.  Patient reports she recalls this happening.  She reports she was uncomfortable and was having lower abdominal pain.  Her daughter describes it is somewhat looking like seizure activity but she did not seem to lose consciousness.  At that point her daughter called 911.  After patient had gotten here and had been waiting she did go on to have a large bowel movement.  She reports she feels fine at this time.  She does not have any pain and no complaints.    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   Diabetic neuropathy ([place]) 08/19/2018   Hypertensive urgency 04/10/2018   Fall 12/08/2017   Vascular dementia of acute onset without behavioral disturbance ([place]) 09/05/2017   Urge incontinence 09/05/2017   Decreased sensation of foot 09/04/2017   Onychomycosis of toenail 09/04/2017   Memory difficulties 08/06/2017   HLD (hyperlipidemia) 06/02/2017   CKD (chronic kidney disease), stage III 06/02/2017   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 No pertinent surgical history.   OB History   No obstetric history on file.     Family History  Problem Relation Age of Onset   Hypertension Mother     Social History   Tobacco Use   Smoking status: Never Smoker   Smokeless tobacco: Never Used  Substance Use Topics   Alcohol use: Yes    Alcohol/week: 2.0 standard drinks    Types: 1 Glasses of wine, 1 Shots of liquor per week    Comment: Patient occasionally drinks wine.  She talks about rum but her grandchildren say she does not buy any   Drug use: Never    Home Medications Prior to Admission medications   Medication Sig Start Date End  Date Taking? Authorizing Provider  acetaminophen (TYLENOL) 325 MG tablet Take 650 mg by mouth every 6 (six) hours as needed.    [provider]  amLODipine (NORVASC) 2.5 MG tablet TAKE 1 TABLET (2.5 MG TOTAL) BY MOUTH AT BEDTIME. 09/07/18   Wilber Oliphant, MD  aspirin EC 81 MG tablet Take 81 mg by mouth daily.    [provider]  atorvastatin (LIPITOR) 20 MG tablet Take 1 tablet (20 mg total) by mouth daily. 09/18/18   Wilber Oliphant, MD  Blood Glucose Monitoring Suppl (ONETOUCH VERIO FLEX SYSTEM) w/Device KIT Check once daily in the morning  before breakfast 06/11/18   Martyn Malay, MD  candesartan (ATACAND) 8 MG tablet Take 1 tablet (8 mg total) by mouth at bedtime. 07/13/18   Zenia Resides, MD  Cholecalciferol (VITAMIN D) 50 MCG (2000 UT) tablet Take 2,000 Units by mouth daily.    [provider]  glucose blood test strip Use as instructed 06/11/18   Martyn Malay, MD  Lancet Devices (ONE TOUCH DELICA LANCING DEV) MISC 1 Device by Does not apply route daily as needed. 06/02/17   Glenis Smoker, MD  OneTouch Delica Lancets 40G MISC 1 Units by Does not apply route daily as needed. 06/11/18   Martyn Malay, MD    Allergies    Patient has no known allergies.  Review of Systems   Review of Systems 10 Systems reviewed and are negative for acute change except as noted in the HPI.  Physical Exam Updated Vital Signs BP (!) 152/75    Pulse (!) 103    Temp 98.2 F (36.8 C) (Oral)    Resp 17    SpO2 99%   Physical Exam Constitutional:      Comments: Patient is alert and nontoxic.  She is interactive and clinically well in appearance.  Speech is clear, respirations are nonlabored.  HENT:     Head: Normocephalic and atraumatic.  Eyes:     Extraocular Movements: Extraocular movements intact.     Conjunctiva/sclera: Conjunctivae normal.  Cardiovascular:     Rate and Rhythm: Normal rate and regular rhythm.  Pulmonary:     Effort: Pulmonary effort is normal.     Breath sounds: Normal breath sounds.  Abdominal:     General: There is no distension.     Palpations: Abdomen is soft.     Tenderness: There is no abdominal tenderness. There is no guarding.  Genitourinary:    Comments: Soft brown stool in the vault.  No fecal impaction.  No blood. Musculoskeletal:     Cervical back: Neck supple.     Comments: Feet are symmetric.  No peripheral edema.  Feet are dry with scaling skin but no wounds.  She denies any pain to palpation of the lower extremities or the calves.  Skin:    General: Skin is warm and dry.    Neurological:     General: No focal deficit present.     Mental Status: She is oriented to person, place, and time.     Motor: No weakness.     Coordination: Coordination normal.  Psychiatric:        Mood and Affect: Mood normal.     ED Results / Procedures / Treatments   Labs (all labs ordered are listed, but only abnormal results are displayed) Labs Reviewed  COMPREHENSIVE METABOLIC PANEL - Abnormal; Notable for the following components:      Result Value   Glucose, Bld  165 (*)    All other components within normal limits  URINALYSIS, ROUTINE W REFLEX MICROSCOPIC - Abnormal; Notable for the following components:   APPearance HAZY (*)    Ketones, ur 5 (*)    All other components within normal limits  CBC  POC OCCULT BLOOD, ED  TROPONIN I (HIGH SENSITIVITY)  TROPONIN I (HIGH SENSITIVITY)    EKG EKG Interpretation  Date/Time:  Thursday February 18 2019 15:59:57 EST Ventricular Rate:  87 PR Interval:    QRS Duration: 74 QT Interval:  414 QTC Calculation: 499 R Axis:   -36 Text Interpretation: Sinus rhythm Left ventricular hypertrophy Inferior infarct, old Anterior Q waves, possibly due to LVH Baseline wander in lead(s) V3 similar to previous except t wave flattening in V2 Confirmed by Charlesetta Shanks [phone]) on 02/18/2019 4:12:51 PM   Radiology DG Chest 2 View  Result Date: 02/18/2019 CLINICAL DATA:  Weakness for the past 2 days. EXAM: CHEST - 2 VIEW COMPARISON:  Chest x-ray dated April 11, 2018. FINDINGS: The patient is rotated to the left. Stable mild cardiomegaly. Normal mediastinal contours. Normal pulmonary vascularity. No focal consolidation, pleural effusion, or pneumothorax. No acute osseous abnormality. IMPRESSION: No active cardiopulmonary disease. Electronically Signed   By: Titus Dubin M.D.   On: 02/18/2019 14:58    Procedures Procedures (including critical care time)  Medications Ordered in ED Medications - No data to display  ED Course  I have  reviewed the triage vital signs and the nursing notes.  Pertinent labs & imaging results that were available during my care of the patient were reviewed by me and considered in my medical decision making (see chart for details).  Clinical Course as of Feb 18 1912  Thu Feb 18, 2019  1759 Recheck: Patient feels well.  She has had no recurrences of pain.  Will trial ambulation for baseline gait with walker.   [MP]    Clinical Course User Index [MP] Charlesetta Shanks, MD   MDM Rules/Calculators/A&P                     Patient had an episode this morning which appears to have been a near syncopal, vasovagal episode in response to abdominal pain.  She had been attempting to have a bowel movement earlier and with the help of her daughter trying to administer an enema.  She has just recently been taking MiraLAX for some constipation.  Apparently that did not go well in terms of developing pain and not really passing much of a bowel movement.  He proceeded to get dressed and and cleaned up.  Subsequent to that an episode occurred after eating whereby her daughter observed her to be holding her lower abdomen and kind of moaning and thrusting herself somewhat back to the left.  By the time of the patient made it to the emergency department, she did go on to have a large bowel movement while in the waiting room.  She has been asymptomatic since that time.  Diagnostic studies are within normal limits.  There is no leukocytosis or anemia.  No signs of UTI.  No urinary retention.  No fecal impaction.  No rectal blood.  Patient has ambulated at baseline with a walker to the bathroom and back.  At this time, plan will be for discharge home.  Return precautions reviewed.  Final Clinical Impression(s) / ED Diagnoses Final diagnoses:  Lower abdominal pain  Generalized weakness    Rx / DC Orders ED Discharge Orders  None       Charlesetta Shanks, MD 02/18/19 [id number]

## 2019-02-18 NOTE — ED Notes (Signed)
Pt ambulated to bathroom using walker and 1 person assist

## 2019-02-18 NOTE — ED Triage Notes (Signed)
Pt arrives to ED from home where she lives with family with complaints of generalized weakness for the past two days. Patient has hx of stroke with left sided deficients. Per family patient is at baseline at this time but is not eating or getting around as easy. Per family patient has been constipated this week.

## 2019-02-18 NOTE — Discharge Instructions (Addendum)
1.  Continue taking your MiraLAX daily until you are having a normal bowel movement without difficulty. 2.  Return to the emergency department if you have recurrence of abdominal pain, develop a fever or other concerning symptoms. 3.  Make a follow-up appointment with your doctor within the next 1 to 3 days for recheck.

## 2019-03-12 ENCOUNTER — Telehealth: Payer: Self-pay

## 2019-03-12 DIAGNOSIS — K5904 Chronic idiopathic constipation: Secondary | ICD-10-CM

## 2019-03-12 NOTE — Telephone Encounter (Signed)
Sarah calls nurse line stating she has been doing 1 cap full of miralax in patients "drink" 2x daily. Maralyn Sago stated the patient is still not able to go to the bathroom on her own. Maralyn Sago stated, "I have to pull it out of her." Please call Sarah with any advice and/or medication changes.

## 2019-03-15 MED ORDER — GLYCERIN (ADULT) 2 G RE SUPP: 1.0000 | RECTAL | 0 refills | Status: DC | PRN

## 2019-03-15 NOTE — Telephone Encounter (Signed)
Called Sarah back.  We discussed at length.  The patient is drinking ensures and tea throughout the day.  She is drinking 1-2 classes with MiraLAX mixed in.  She is having soft stools but does have harder stools more proximally located in the rectum.  Recommended she trial a suppository for 1 to 2 days in a row to loosen stools.  She will call if there is no improvement.  Glycerin suppository sent to pharmacy.  Also discussed with Dr. McDiarmid.  Terisa Starr, MD  Family Medicine Teaching Service

## 2019-04-19 ENCOUNTER — Ambulatory Visit: Payer: Medicare HMO | Admitting: Podiatry

## 2019-08-20 ENCOUNTER — Other Ambulatory Visit: Payer: Self-pay | Admitting: Family Medicine

## 2019-08-20 DIAGNOSIS — E1159 Type 2 diabetes mellitus with other circulatory complications: Secondary | ICD-10-CM

## 2019-08-20 DIAGNOSIS — I152 Hypertension secondary to endocrine disorders: Secondary | ICD-10-CM

## 2019-08-23 ENCOUNTER — Encounter: Payer: Self-pay | Admitting: Podiatry

## 2019-08-23 ENCOUNTER — Other Ambulatory Visit: Payer: Self-pay

## 2019-08-23 ENCOUNTER — Ambulatory Visit: Payer: Medicare HMO | Admitting: Podiatry

## 2019-08-23 DIAGNOSIS — B351 Tinea unguium: Secondary | ICD-10-CM | POA: Diagnosis not present

## 2019-08-23 DIAGNOSIS — M79674 Pain in right toe(s): Secondary | ICD-10-CM

## 2019-08-23 DIAGNOSIS — N183 Chronic kidney disease, stage 3 unspecified: Secondary | ICD-10-CM | POA: Diagnosis not present

## 2019-08-23 DIAGNOSIS — E1159 Type 2 diabetes mellitus with other circulatory complications: Secondary | ICD-10-CM

## 2019-08-23 DIAGNOSIS — M79675 Pain in left toe(s): Secondary | ICD-10-CM

## 2019-08-23 NOTE — Progress Notes (Addendum)
This patient returns to my office for at risk foot care.  This patient requires this care by a professional since this patient will be at risk due to having diabetes and chronic kidney disease.  This patient is unable to cut nails hierelf since the patient cannot reach her nails.These nails are painful walking and wearing shoes.  This patient presents for at risk foot care today.  General Appearance  Alert, conversant and in no acute stress.  Vascular  Dorsalis pedis and posterior tibial  pulses are  Not palpable  bilaterally.  Capillary return is within normal limits  bilaterally. Temperature is within normal limits  bilaterally.  Neurologic  Senn-Weinstein monofilament wire test absent   bilaterally. Muscle power within normal limits bilaterally.  Nails Thick disfigured discolored nails with subungual debris  from hallux to fifth toes bilaterally. No evidence of bacterial infection or drainage bilaterally.  Orthopedic  No limitations of motion  feet .  No crepitus or effusions noted.  No bony pathology or digital deformities noted.  HAV  B/L.  Skin  normotropic skin with no porokeratosis noted bilaterally.  No signs of infections or ulcers noted.     Onychomycosis  Pain in right toes  Pain in left toe  Debridement of nails with nail nipper and then dremel tool.  She was instructed to return to the office due to her at risk problems.   Helane Gunther DPM

## 2019-08-24 NOTE — Telephone Encounter (Signed)
Needs appointment before next refill. Hasn't been seen in over a year.

## 2019-08-25 ENCOUNTER — Other Ambulatory Visit: Payer: Self-pay | Admitting: Family Medicine

## 2019-08-25 DIAGNOSIS — E1159 Type 2 diabetes mellitus with other circulatory complications: Secondary | ICD-10-CM

## 2019-08-25 DIAGNOSIS — I152 Hypertension secondary to endocrine disorders: Secondary | ICD-10-CM

## 2019-08-25 NOTE — Telephone Encounter (Signed)
Called patient, no answer. Left HIPPA compliant VM to return call to office to schedule physical.   Veronda Prude, RN

## 2019-10-25 ENCOUNTER — Ambulatory Visit: Payer: Medicare HMO | Admitting: Family Medicine

## 2019-11-15 ENCOUNTER — Other Ambulatory Visit: Payer: Self-pay | Admitting: Family Medicine

## 2019-11-15 DIAGNOSIS — E1159 Type 2 diabetes mellitus with other circulatory complications: Secondary | ICD-10-CM

## 2019-11-15 DIAGNOSIS — I152 Hypertension secondary to endocrine disorders: Secondary | ICD-10-CM

## 2019-11-17 NOTE — Telephone Encounter (Signed)
Called phone number provided and daughter picked up.  Informed that I will not fill this medication in the future without follow-up in the office.  Family will call to make an appointment, preferably on Monday.

## 2019-12-10 ENCOUNTER — Other Ambulatory Visit: Payer: Self-pay | Admitting: Family Medicine

## 2019-12-10 DIAGNOSIS — F0150 Vascular dementia without behavioral disturbance: Secondary | ICD-10-CM

## 2020-02-05 ENCOUNTER — Other Ambulatory Visit: Payer: Self-pay | Admitting: Family Medicine

## 2020-02-05 DIAGNOSIS — E1159 Type 2 diabetes mellitus with other circulatory complications: Secondary | ICD-10-CM

## 2020-02-05 DIAGNOSIS — I152 Hypertension secondary to endocrine disorders: Secondary | ICD-10-CM

## 2020-02-07 NOTE — Telephone Encounter (Signed)
Needs appointment for future refills. Have called to notify patient in the past.

## 2020-02-08 MED ORDER — AMLODIPINE BESYLATE 2.5 MG PO TABS: 2.5000 mg | ORAL_TABLET | Freq: Every day | ORAL | 0 refills | Status: DC

## 2020-02-08 NOTE — Telephone Encounter (Signed)
Original rx-transmission to pharmacy failed. Resent per original order.   Semaj Kham C Yvonne Stopher, RN  

## 2020-02-08 NOTE — Addendum Note (Signed)
Addended by: Veronda Prude on: 02/08/2020 01:39 PM   Modules accepted: Orders

## 2020-03-20 ENCOUNTER — Telehealth: Payer: Self-pay | Admitting: Family Medicine

## 2020-03-20 DIAGNOSIS — I6300 Cerebral infarction due to thrombosis of unspecified precerebral artery: Secondary | ICD-10-CM

## 2020-03-20 DIAGNOSIS — F0150 Vascular dementia without behavioral disturbance: Secondary | ICD-10-CM

## 2020-03-20 NOTE — Telephone Encounter (Signed)
Spoke with son. Patient has had some decline in last few months, would like to explore options for additional support at home. Given functional decline, PT home health ordered.  Patient due for labs- Needs repeat metabolic panel, lipid, vitamin D (on supplementation, not for screening).  Scheduled with Dr. Rachael Darby.  Discussed there may be limited options for at home services (only has Medicare?) will ask CCM to weigh in to see if additional support can be offered to family. Amenable to referral.  Terisa Starr, MD  [facility] Medicine Teaching Service

## 2020-03-20 NOTE — Telephone Encounter (Signed)
Patient has some increasing needs at home, needs home health. This requires face to face--called Ms. Phillips Hay to discuss.   The patient will likely need a visit (in person) as she needs blood work completed as well.  Will try again later.  Terisa Starr, MD  Family Medicine Teaching Service

## 2020-03-24 NOTE — Telephone Encounter (Signed)
Marchelle Folks RN with [facility] calls nurse line stating they are starting services on 3/13. Marchelle Folks is requesting recent office notes as the ones she received are out of date. Advised those are the most recent ones, however she does have an upcoming apt on 3/28. Marchelle Folks reports as long as she keeps that apt they will be able to provide services and will still start on 3/13.

## 2020-04-03 ENCOUNTER — Ambulatory Visit: Payer: Medicare HMO | Admitting: Podiatry

## 2020-04-03 ENCOUNTER — Other Ambulatory Visit: Payer: Self-pay

## 2020-04-03 ENCOUNTER — Encounter: Payer: Self-pay | Admitting: Podiatry

## 2020-04-03 DIAGNOSIS — M79674 Pain in right toe(s): Secondary | ICD-10-CM

## 2020-04-03 DIAGNOSIS — B351 Tinea unguium: Secondary | ICD-10-CM

## 2020-04-03 DIAGNOSIS — N183 Chronic kidney disease, stage 3 unspecified: Secondary | ICD-10-CM

## 2020-04-03 DIAGNOSIS — M79675 Pain in left toe(s): Secondary | ICD-10-CM | POA: Diagnosis not present

## 2020-04-03 DIAGNOSIS — E1159 Type 2 diabetes mellitus with other circulatory complications: Secondary | ICD-10-CM | POA: Diagnosis not present

## 2020-04-03 NOTE — Progress Notes (Signed)
This patient returns to my office for at risk foot care.  This patient requires this care by a professional since this patient will be at risk due to having diabetes and chronic kidney disease.  This patient is unable to cut nails hierelf since the patient cannot reach her nails.These nails are painful walking and wearing shoes. Patient presents with her female caregiver.  Patient has not been seen in over 7 months. This patient presents for at risk foot care today.  General Appearance  Alert, conversant and in no acute stress.  Vascular  Dorsalis pedis and posterior tibial  pulses are  Not palpable  bilaterally.  Capillary return is within normal limits  bilaterally. Temperature is within normal limits  bilaterally.  Neurologic  Senn-Weinstein monofilament wire test absent   bilaterally. Muscle power within normal limits bilaterally.  Nails Thick disfigured discolored nails with subungual debris  from hallux to fifth toes bilaterally. No evidence of bacterial infection or drainage bilaterally.  Orthopedic  No limitations of motion  feet .  No crepitus or effusions noted.  No bony pathology or digital deformities noted.  HAV  B/L.  Skin  normotropic skin with no porokeratosis noted bilaterally.  No signs of infections or ulcers noted.     Onychomycosis  Pain in right toes  Pain in left toe  Debridement of nails with nail nipper and then dremel tool.  She was instructed to return to the office due to her at risk problems.   Helane Gunther DPM

## 2020-04-10 ENCOUNTER — Other Ambulatory Visit: Payer: Self-pay

## 2020-04-10 ENCOUNTER — Encounter: Payer: Self-pay | Admitting: Family Medicine

## 2020-04-10 ENCOUNTER — Ambulatory Visit (INDEPENDENT_AMBULATORY_CARE_PROVIDER_SITE_OTHER): Payer: Medicare HMO | Admitting: Family Medicine

## 2020-04-10 VITALS — BP 154/86 | HR 88

## 2020-04-10 DIAGNOSIS — E1159 Type 2 diabetes mellitus with other circulatory complications: Secondary | ICD-10-CM | POA: Diagnosis not present

## 2020-04-10 DIAGNOSIS — F0150 Vascular dementia without behavioral disturbance: Secondary | ICD-10-CM

## 2020-04-10 DIAGNOSIS — I152 Hypertension secondary to endocrine disorders: Secondary | ICD-10-CM

## 2020-04-10 DIAGNOSIS — R413 Other amnesia: Secondary | ICD-10-CM | POA: Diagnosis not present

## 2020-04-10 DIAGNOSIS — E782 Mixed hyperlipidemia: Secondary | ICD-10-CM | POA: Diagnosis not present

## 2020-04-10 DIAGNOSIS — E559 Vitamin D deficiency, unspecified: Secondary | ICD-10-CM | POA: Diagnosis not present

## 2020-04-10 DIAGNOSIS — R69 Illness, unspecified: Secondary | ICD-10-CM | POA: Diagnosis not present

## 2020-04-10 DIAGNOSIS — E1122 Type 2 diabetes mellitus with diabetic chronic kidney disease: Secondary | ICD-10-CM

## 2020-04-10 DIAGNOSIS — N183 Chronic kidney disease, stage 3 unspecified: Secondary | ICD-10-CM | POA: Diagnosis not present

## 2020-04-10 DIAGNOSIS — I10 Essential (primary) hypertension: Secondary | ICD-10-CM

## 2020-04-10 LAB — POCT GLYCOSYLATED HEMOGLOBIN (HGB A1C): HbA1c, POC (controlled diabetic range): 6.1 % (ref 0.0–7.0)

## 2020-04-10 NOTE — Patient Instructions (Addendum)
It was great seeing Mary Walters today!  Please check-out at the front desk before leaving the clinic. I'd like to see her back in 6 months but if she needs to be seen earlier than that for any new issues we're happy to fit you in, just give [place] a call!  Visit Remembers: -Continue to take medications daily.  -Mary Walters was referred for home health services someone should call you with additional information.    Regarding lab work today:  Due to recent changes in healthcare laws, you may see the results of your imaging and laboratory studies on MyChart before your provider has had a chance to review them.  I understand that in some cases there may be results that are confusing or concerning to you. Not all laboratory results come back in the same time frame and you may be waiting for multiple results in order to interpret others.  Please give [place] 72 hours in order for your provider to thoroughly review all the results before contacting the office for clarification of your results. If everything is normal, you will get a letter in the mail or a message in My Chart. Please give [place] a call if you do not hear from [place] after 2 weeks.  Please bring all of your medications with you to each visit.    If you haven't already, sign up for My Chart to have easy access to your labs results, and communication with your primary care physician.  Feel free to call with any questions or concerns at any time, at [phone].   Take care,  Dr. Katherina [facility]

## 2020-04-10 NOTE — Assessment & Plan Note (Signed)
BP 154/86 today. Continue amlodipine and valsartan. BMP and lipid panel today.

## 2020-04-10 NOTE — Progress Notes (Signed)
     SUBJECTIVE:   CHIEF COMPLAINT / HPI:   Mary Walters is a 87 y.o. female here for HTN and DM follow up.   Diabetes Mellitus  Eats 3 meals a day and fruit, jello, pudding and chips for snacks. Takes no medications. Does not check blood sugar at home.   HTN Takes candesartan and amlodipine. Denies missing doses of antihypertensive medications. Her son gives her medications daily.  Home BP Monitoring: No  Exercises:  not active    PERTINENT  PMH / PSH: reviewed and updated as appropriate   OBJECTIVE:   BP (!) 154/86   Pulse 88   SpO2 99%    GEN: pleasant elderly female, in no acute distress CV: regular rate and rhythm RESP: no increased work of breathing, clear to ascultation bilaterally  MSK: no edema, strength 4/5 bilateral upper and lower extremities, rolling walker present in exam room SKIN: warm, dry NEURO: alert, oriented to person, place, not time, moves all extremities appropriately    ASSESSMENT/PLAN:   Vascular dementia of acute onset without behavioral disturbance Referral placed for home health services. Son reports pt has recently completed PT in the home. No recent falls. Uses a rolling walker. He would like for someone to come to the home to help pt during the day.   Type 2 diabetes mellitus with stage 3 chronic kidney disease, without long-term current use of insulin (HCC) Stable. A1c today 6.1. Pt is well controlled with diet.  Encouraged continued diet rich in vegetables and complex carbs.   Statin therapy: Lipitor  ACEi/ARB: Valsartan Foot exam: UTD Urine microalbumine: Consider at next visit Eye exam: Advise yearly eye exam.  PCV vaccine: Due, consider PCV 20    HLD (hyperlipidemia) Taking Lipitor 20 mg daily. Tolerating statin well. Lipid panel today.    Hypertension BP 154/86 today. Continue amlodipine and valsartan. BMP and lipid panel today.   Vitamin D deficiency Check Vit D and assess Ca level with BMP.      Katha Cabal, DO PGY-2,  Family Medicine 04/10/2020

## 2020-04-10 NOTE — Assessment & Plan Note (Signed)
Taking Lipitor 20 mg daily. Tolerating statin well. Lipid panel today.

## 2020-04-10 NOTE — Assessment & Plan Note (Signed)
Stable. A1c today 6.1. Pt is well controlled with diet.  Encouraged continued diet rich in vegetables and complex carbs.   Statin therapy: Lipitor  ACEi/ARB: Valsartan Foot exam: UTD Urine microalbumine: Consider at next visit Eye exam: Advise yearly eye exam.  PCV vaccine: Due, consider PCV 20

## 2020-04-10 NOTE — Assessment & Plan Note (Addendum)
Referral placed for home health services. Son reports pt has recently completed PT in the home. No recent falls. Uses a rolling walker. He would like for someone to come to the home to help pt during the day.

## 2020-04-10 NOTE — Assessment & Plan Note (Signed)
Check Vit D and assess Ca level with BMP.

## 2020-04-11 LAB — VITAMIN D 25 HYDROXY (VIT D DEFICIENCY, FRACTURES): Vit D, 25-Hydroxy: 29.4 ng/mL — ABNORMAL LOW (ref 30.0–100.0)

## 2020-04-11 LAB — BASIC METABOLIC PANEL
BUN/Creatinine Ratio: 23 (ref 12–28)
BUN: 19 mg/dL (ref 8–27)
CO2: 23 mmol/L (ref 20–29)
Calcium: 9.9 mg/dL (ref 8.7–10.3)
Chloride: 104 mmol/L (ref 96–106)
Creatinine, Ser: 0.81 mg/dL (ref 0.57–1.00)
Glucose: 168 mg/dL — ABNORMAL HIGH (ref 65–99)
Potassium: 3.8 mmol/L (ref 3.5–5.2)
Sodium: 143 mmol/L (ref 134–144)
eGFR: 69 mL/min/{1.73_m2} (ref >59)

## 2020-04-11 LAB — LIPID PANEL
Chol/HDL Ratio: 2.3 ratio (ref 0.0–4.4)
Cholesterol, Total: 165 mg/dL (ref 100–199)
HDL: 72 mg/dL (ref >39)
LDL Chol Calc (NIH): 79 mg/dL (ref 0–99)
Triglycerides: 71 mg/dL (ref 0–149)
VLDL Cholesterol Cal: 14 mg/dL (ref 5–40)

## 2020-04-13 ENCOUNTER — Encounter: Payer: Self-pay | Admitting: *Deleted

## 2020-05-06 ENCOUNTER — Other Ambulatory Visit: Payer: Self-pay | Admitting: Family Medicine

## 2020-05-06 DIAGNOSIS — E1159 Type 2 diabetes mellitus with other circulatory complications: Secondary | ICD-10-CM

## 2020-05-06 DIAGNOSIS — I152 Hypertension secondary to endocrine disorders: Secondary | ICD-10-CM

## 2020-06-18 ENCOUNTER — Other Ambulatory Visit: Payer: Self-pay

## 2020-06-18 ENCOUNTER — Encounter (HOSPITAL_COMMUNITY): Payer: Self-pay | Admitting: Emergency Medicine

## 2020-06-18 ENCOUNTER — Emergency Department (HOSPITAL_COMMUNITY): Payer: Medicare HMO

## 2020-06-18 ENCOUNTER — Emergency Department (HOSPITAL_COMMUNITY)
Admission: EM | Admit: 2020-06-18 | Discharge: 2020-06-19 | Disposition: A | Discharge status: Home / Self Care | Payer: Medicare HMO | Source: Home / Self Care | Attending: Emergency Medicine | Admitting: Emergency Medicine

## 2020-06-18 DIAGNOSIS — R0902 Hypoxemia: Secondary | ICD-10-CM | POA: Diagnosis not present

## 2020-06-18 DIAGNOSIS — Z79899 Other long term (current) drug therapy: Secondary | ICD-10-CM | POA: Insufficient documentation

## 2020-06-18 DIAGNOSIS — U071 COVID-19: Secondary | ICD-10-CM

## 2020-06-18 DIAGNOSIS — I129 Hypertensive chronic kidney disease with stage 1 through stage 4 chronic kidney disease, or unspecified chronic kidney disease: Secondary | ICD-10-CM | POA: Insufficient documentation

## 2020-06-18 DIAGNOSIS — I619 Nontraumatic intracerebral hemorrhage, unspecified: Secondary | ICD-10-CM | POA: Diagnosis not present

## 2020-06-18 DIAGNOSIS — N183 Chronic kidney disease, stage 3 unspecified: Secondary | ICD-10-CM | POA: Insufficient documentation

## 2020-06-18 DIAGNOSIS — R531 Weakness: Secondary | ICD-10-CM | POA: Insufficient documentation

## 2020-06-18 DIAGNOSIS — E1122 Type 2 diabetes mellitus with diabetic chronic kidney disease: Secondary | ICD-10-CM | POA: Insufficient documentation

## 2020-06-18 DIAGNOSIS — S0990XA Unspecified injury of head, initial encounter: Secondary | ICD-10-CM | POA: Diagnosis not present

## 2020-06-18 DIAGNOSIS — R059 Cough, unspecified: Secondary | ICD-10-CM | POA: Diagnosis not present

## 2020-06-18 DIAGNOSIS — Z7982 Long term (current) use of aspirin: Secondary | ICD-10-CM | POA: Insufficient documentation

## 2020-06-18 DIAGNOSIS — F0390 Unspecified dementia without behavioral disturbance: Secondary | ICD-10-CM | POA: Insufficient documentation

## 2020-06-18 DIAGNOSIS — E1140 Type 2 diabetes mellitus with diabetic neuropathy, unspecified: Secondary | ICD-10-CM | POA: Insufficient documentation

## 2020-06-18 DIAGNOSIS — I517 Cardiomegaly: Secondary | ICD-10-CM | POA: Diagnosis not present

## 2020-06-18 DIAGNOSIS — Z8673 Personal history of transient ischemic attack (TIA), and cerebral infarction without residual deficits: Secondary | ICD-10-CM | POA: Insufficient documentation

## 2020-06-18 DIAGNOSIS — R296 Repeated falls: Secondary | ICD-10-CM | POA: Diagnosis not present

## 2020-06-18 DIAGNOSIS — I10 Essential (primary) hypertension: Secondary | ICD-10-CM | POA: Diagnosis not present

## 2020-06-18 LAB — CBC WITH DIFFERENTIAL/PLATELET
Abs Immature Granulocytes: 0.02 10*3/uL (ref 0.00–0.07)
Basophils Absolute: 0 10*3/uL (ref 0.0–0.1)
Basophils Relative: 0 %
Eosinophils Absolute: 0 10*3/uL (ref 0.0–0.5)
Eosinophils Relative: 0 %
HCT: 38 % (ref 36.0–46.0)
Hemoglobin: 11.9 g/dL — ABNORMAL LOW (ref 12.0–15.0)
Immature Granulocytes: 1 %
Lymphocytes Relative: 15 %
Lymphs Abs: 0.6 10*3/uL — ABNORMAL LOW (ref 0.7–4.0)
MCH: 26.9 pg (ref 26.0–34.0)
MCHC: 31.3 g/dL (ref 30.0–36.0)
MCV: 85.8 fL (ref 80.0–100.0)
Monocytes Absolute: 0.2 10*3/uL (ref 0.1–1.0)
Monocytes Relative: 5 %
Neutro Abs: 3.4 10*3/uL (ref 1.7–7.7)
Neutrophils Relative %: 79 %
Platelets: 150 10*3/uL (ref 150–400)
RBC: 4.43 MIL/uL (ref 3.87–5.11)
RDW: 14.6 % (ref 11.5–15.5)
WBC: 4.3 10*3/uL (ref 4.0–10.5)
nRBC: 0 % (ref 0.0–0.2)

## 2020-06-18 LAB — COMPREHENSIVE METABOLIC PANEL
ALT: 17 U/L (ref 0–44)
AST: 32 U/L (ref 15–41)
Albumin: 3.2 g/dL — ABNORMAL LOW (ref 3.5–5.0)
Alkaline Phosphatase: 71 U/L (ref 38–126)
Anion gap: 8 (ref 5–15)
BUN: 10 mg/dL (ref 8–23)
CO2: 24 mmol/L (ref 22–32)
Calcium: 8.8 mg/dL — ABNORMAL LOW (ref 8.9–10.3)
Chloride: 106 mmol/L (ref 98–111)
Creatinine, Ser: 0.78 mg/dL (ref 0.44–1.00)
GFR, Estimated: >60 mL/min (ref >60)
Glucose, Bld: 176 mg/dL — ABNORMAL HIGH (ref 70–99)
Potassium: 3.6 mmol/L (ref 3.5–5.1)
Sodium: 138 mmol/L (ref 135–145)
Total Bilirubin: 0.7 mg/dL (ref 0.3–1.2)
Total Protein: 6.7 g/dL (ref 6.5–8.1)

## 2020-06-18 LAB — RESP PANEL BY RT-PCR (FLU A&B, COVID) ARPGX2
Influenza A by PCR: NEGATIVE
Influenza B by PCR: NEGATIVE
SARS Coronavirus 2 by RT PCR: POSITIVE — AB

## 2020-06-18 MED ORDER — SODIUM CHLORIDE 0.9% FLUSH: 3.0000 mL | Freq: Once | INTRAVENOUS | Status: DC

## 2020-06-18 NOTE — ED Provider Notes (Signed)
Emergency Medicine Provider Triage Evaluation Note  Mary Walters , a 87 y.o. female  was evaluated in triage.  Pt complains of  Fall.  Daughter reports that she has left sided weakness.  She has had two falls recently.    Review of Systems  Positive: AMS, falls Negative: Fever, sickness  Physical Exam  BP (!) 165/90 (BP Location: Left Arm)   Pulse 95   Temp 99.8 F (37.7 C) (Oral)   Resp 18   SpO2 100%  Gen:   Awake, no distress   Resp:  Normal effort  MSK:   Moves extremities without difficulty  Other:  No drift on left arm or leg  Medical Decision Making  Medically screening exam initiated at 5:00 PM.  Appropriate orders placed.  Mary Walters was informed that the remainder of the evaluation will be completed by another provider, this initial triage assessment does not replace that evaluation, and the importance of remaining in the ED until their evaluation is complete.     Cristina Gong, PA-C 06/18/20 1707    Wynetta Fines, MD 06/18/20 [phone]

## 2020-06-18 NOTE — ED Notes (Signed)
Pt vomited on the way to room. Set up suction at beside. Cleaned and changed pt.

## 2020-06-18 NOTE — ED Provider Notes (Signed)
[facility] EMERGENCY DEPARTMENT Provider Note   CSN: [id number] Arrival date & time: 06/18/20  1634     History Chief Complaint  Patient presents with  . Weakness  . Fall    Mary Walters is a 87 y.o. female.  The history is provided by the patient, a caregiver and a relative.  Fall This is a new problem. The current episode started more than 2 days ago. The problem occurs rarely. The problem has not changed since onset.Pertinent negatives include no chest pain, no abdominal pain, no headaches and no shortness of breath. The symptoms are aggravated by standing. Nothing relieves the symptoms. She has tried nothing for the symptoms.   Patient brought to the emergency department by her daughter-in-law with concerns about patient's willingness/ability to stand and walk with assistance like she normally is. Patient had 2 separate controlled falls this past week 4-5 days ago.  Did not reportedly hit her head during the falls.  Each of the falls were witnessed by family members and were at least partially controlled.  Since that time however patient has been unable to stand and support herself on her feet as much as she normally is.  Daughter-in-law tells me that she does not normally ambulate on her own at baseline.  They also report worsening productive cough over the last several days however no fever, shortness of breath.     Past Medical History:  Diagnosis Date  . Diabetes mellitus without complication ([place])   . HLD (hyperlipidemia) 06/02/2017  . Hypertension   . 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 Urge incontinence 09/05/2017  . Vascular dementia of acute onset without behavioral disturbance ([place]) 09/05/2017    Patient Active Problem List   Diagnosis Date Noted  . Vitamin D deficiency 04/10/2020  . Alkaline phosphatase elevation 09/04/2018  . Type 2 diabetes mellitus with vascular disease ([place]) 08/19/2018  . Diabetic neuropathy ([place]) 08/19/2018  .  Hypertensive urgency 04/10/2018  . Fall 12/08/2017  . Vascular dementia of acute onset without behavioral disturbance ([place]) 09/05/2017  . Urge incontinence 09/05/2017  . Decreased sensation of foot 09/04/2017  . Onychomycosis of toenail 09/04/2017  . Memory difficulties 08/06/2017  . HLD (hyperlipidemia) 06/02/2017  . CKD (chronic kidney disease), stage III ([place]) 06/02/2017  . 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 Type 2 diabetes mellitus with stage 3 chronic kidney disease, without long-term current use of insulin ([place]) 05/14/2017  . Hypertension 05/14/2017    History reviewed. No pertinent surgical history.   OB History   No obstetric history on file.     Family History  Problem Relation Age of Onset  . Hypertension Mother     Social History   Tobacco Use  . Smoking status: Never Smoker  . Smokeless tobacco: Never Used  Vaping Use  . Vaping Use: Never used  Substance Use Topics  . Alcohol use: Yes    Alcohol/week: 2.0 standard drinks    Types: 1 Glasses of wine, 1 Shots of liquor per week    Comment: Patient occasionally drinks wine.  She talks about rum but her grandchildren say she does not buy any  . Drug use: Never    Home Medications Prior to Admission medications   Medication Sig Start Date End Date Taking? Authorizing Provider  amLODipine (NORVASC) 2.5 MG tablet TAKE 1 TABLET BY MOUTH AT BEDTIME. Patient taking differently: Take 2.5 mg by mouth at bedtime. 05/08/20  Yes Wilber Oliphant, MD  aspirin EC 81 MG tablet  Take 81 mg by mouth at bedtime.   Yes [provider]  atorvastatin (LIPITOR) 20 MG tablet TAKE 1 TABLET BY MOUTH EVERY DAY Patient taking differently: Take 20 mg by mouth at bedtime. 12/14/19  Yes Wilber Oliphant, MD  candesartan (ATACAND) 8 MG tablet TAKE 1 TABLET (8 MG TOTAL) BY MOUTH AT BEDTIME. 08/25/19  Yes Wilber Oliphant, MD  acetaminophen (TYLENOL) 325 MG tablet Take 650 mg by mouth every 6 (six) hours as needed. Patient not taking: No  sig reported    [provider]  Blood Glucose Monitoring Suppl ([place]) w/Device KIT Check once daily in the morning before breakfast 06/11/18   Martyn Malay, MD  Cholecalciferol (VITAMIN D) 50 MCG (2000 UT) tablet Take 2,000 Units by mouth daily. Patient not taking: No sig reported    [provider]  glucose blood test strip Use as instructed 06/11/18   Martyn Malay, MD  glycerin adult 2 g suppository Place 1 suppository rectally as needed for constipation. Patient not taking: No sig reported 03/15/19   Martyn Malay, MD  Lancet Devices (ONE TOUCH DELICA LANCING DEV) MISC 1 Device by Does not apply route daily as needed. 06/02/17   Glenis Smoker, MD  OneTouch Delica Lancets 16S MISC 1 Units by Does not apply route daily as needed. 06/11/18   Martyn Malay, MD    Allergies    Patient has no known allergies.  Review of Systems   Review of Systems  Constitutional: Negative for chills and fever.  HENT: Negative for ear pain and sore throat.   Eyes: Negative for pain and visual disturbance.  Respiratory: Positive for cough. Negative for shortness of breath.   Cardiovascular: Negative for chest pain and palpitations.  Gastrointestinal: Negative for abdominal pain and vomiting.  Genitourinary: Negative for dysuria and hematuria.  Musculoskeletal: Negative for arthralgias and back pain.  Skin: Negative for color change and rash.  Neurological: Positive for weakness. Negative for dizziness, seizures, syncope, speech difficulty and headaches.  All other systems reviewed and are negative.   Physical Exam Updated Vital Signs BP (!) 155/96 (BP Location: Left Arm)   Pulse 78   Temp 99 F (37.2 C) (Oral)   Resp 16   SpO2 96%   Physical Exam Vitals and nursing note reviewed. Exam conducted with a chaperone present.  Constitutional:      General: She is not in acute distress.    Appearance: She is well-developed. She is not ill-appearing  or toxic-appearing.     Comments:  Elderly appearing  HENT:     Head: Normocephalic and atraumatic.     Mouth/Throat:     Mouth: Mucous membranes are moist. No injury or oral lesions.     Pharynx: Posterior oropharyngeal erythema (mild) present. No oropharyngeal exudate.     Tonsils: No tonsillar exudate.  Eyes:     General: No visual field deficit.    Conjunctiva/sclera: Conjunctivae normal.  Cardiovascular:     Rate and Rhythm: Normal rate and regular rhythm.     Pulses: Normal pulses.     Heart sounds: No murmur heard.   Pulmonary:     Effort: Pulmonary effort is normal. No tachypnea or respiratory distress.     Breath sounds: Normal breath sounds. Transmitted upper airway sounds present.  Chest:     Chest wall: No deformity, swelling or tenderness.  Abdominal:     General: Abdomen is flat.     Palpations: Abdomen  is soft.     Tenderness: There is no abdominal tenderness.  Musculoskeletal:     Cervical back: Full passive range of motion without pain, normal range of motion and neck supple. No rigidity. No spinous process tenderness or muscular tenderness.     Right lower leg: No edema.     Left lower leg: No edema.     Comments:  5/5 strength in bilateral upper and lower extremities with full range of motion across all joint lines.  No deformities, swelling, abrasions or other injuries noted.  Skin:    General: Skin is warm and dry.     Findings: No rash or wound.  Neurological:     General: No focal deficit present.     Mental Status: She is alert. Mental status is at baseline. She is confused.     GCS: GCS eye subscore is 4. GCS verbal subscore is 4. GCS motor subscore is 6.     Cranial Nerves: No cranial nerve deficit, dysarthria or facial asymmetry.     Sensory: Sensation is intact. No sensory deficit.     Motor: Motor function is intact. No weakness, tremor, abnormal muscle tone or pronator drift.     Coordination: Finger-Nose-Finger Test and Heel to [employer]  normal.     Comments:    Psychiatric:        Behavior: Behavior is cooperative.     ED Results / Procedures / Treatments   Labs (all labs ordered are listed, but only abnormal results are displayed) Labs Reviewed  RESP PANEL BY RT-PCR (FLU A&B, COVID) ARPGX2 - Abnormal; Notable for the following components:      Result Value   SARS Coronavirus 2 by RT PCR POSITIVE (*)    All other components within normal limits  URINALYSIS, ROUTINE W REFLEX MICROSCOPIC - Abnormal; Notable for the following components:   Glucose, UA 50 (*)    Ketones, ur 20 (*)    Protein, ur 30 (*)    All other components within normal limits  COMPREHENSIVE METABOLIC PANEL - Abnormal; Notable for the following components:   Glucose, Bld 176 (*)    Calcium 8.8 (*)    Albumin 3.2 (*)    All other components within normal limits  CBC WITH DIFFERENTIAL/PLATELET - Abnormal; Notable for the following components:   Hemoglobin 11.9 (*)    Lymphs Abs 0.6 (*)    All other components within normal limits    EKG EKG Interpretation  Date/Time:  Sunday June 18 2020 16:58:33 EDT Ventricular Rate:  91 PR Interval:  166 QRS Duration: 74 QT Interval:  352 QTC Calculation: 432 R Axis:   -22 Text Interpretation: Normal sinus rhythm Minimal voltage criteria for LVH, may be normal variant ( R in aVL ) Inferior infarct , age undetermined Anterolateral infarct , age undetermined Abnormal ECG Confirmed by Quintella Reichert [phone]) on 06/18/2020 6:02:25 PM   Radiology CT Head Wo Contrast  Result Date: 06/18/2020 CLINICAL DATA:  Head trauma, multiple falls. EXAM: CT HEAD WITHOUT CONTRAST TECHNIQUE: Contiguous axial images were obtained from the base of the skull through the vertex without intravenous contrast. COMPARISON:  Head CT dated 05/13/2017 FINDINGS: Brain: Generalized age related parenchymal volume loss with commensurate dilatation of the ventricles and sulci. Chronic small vessel ischemic changes within the bilateral  periventricular and subcortical white matter regions. Old lacunar infarcts within the bilateral basal ganglia regions and pons. No mass, hemorrhage, edema or other evidence of acute parenchymal abnormality. Extra-axial hemorrhage. Vascular: Chronic  calcified atherosclerotic changes of the large vessels at the skull base. No unexpected hyperdense vessel. Skull: Normal. Negative for fracture or focal lesion. Sinuses/Orbits: No acute finding. Other: None. IMPRESSION: 1. No acute findings. No intracranial mass, hemorrhage or edema. No skull fracture. 2. Chronic small vessel ischemic changes in the white matter and basal ganglia regions. Electronically Signed   By: Franki Cabot M.D.   On: 06/18/2020 20:00   DG Chest Portable 1 View  Result Date: 06/18/2020 CLINICAL DATA:  Cough, productive. EXAM: PORTABLE CHEST 1 VIEW COMPARISON:  Chest x-rays dated 02/18/2019 and 04/11/2018. FINDINGS: Stable cardiomegaly. Lungs are clear. Probable chronic pleural thickening at the LEFT costophrenic angle. No pleural effusion or pneumothorax is seen. Osseous structures about the chest are unremarkable. IMPRESSION: 1. No active disease. No evidence of pneumonia or pulmonary edema. 2. Stable cardiomegaly. Electronically Signed   By: Franki Cabot M.D.   On: 06/18/2020 20:01    Procedures Procedures   Medications Ordered in ED Medications - No data to display  ED Course  I have reviewed the triage vital signs and the nursing notes.  Pertinent labs & imaging results that were available during my care of the patient were reviewed by me and considered in my medical decision making (see chart for details).  Clinical Course as of 06/19/20 1500  Sun Jun 18, 2020  2022 Imaging reviewed by myself-no evidence of consolidative pneumonia or other acute cardiopulmonary abnormalities on chest x-ray.  CT head also without evidence of acute intracranial abnormalities. [ZB]  2052 COVID testing was positive, likely explaining her URI  sxs and productive cough. No oxygen requirement at this time. [ZB]  Mon Jun 19, 2020  0008 Still waiting on urine collection to r/o UTI though likely presentation 2/2 COVID infection. [ZB]    Clinical Course User Index [ZB] Pearson Grippe, DO   MDM Rules/Calculators/A&P                          Patient is a 87 year old female with history as above including dementia who was brought to the emergency department by her daughter-in-law with concerns for patient being less active over the last several days particularly with standing and getting out of bed.  This was preceded by 2 separate falls earlier in the week. In the emergency department she was hemodynamically stable, not technically febrile but temperature 99.8.  She had an active, productive cough however not hypoxic or having any evidence of increased work of breathing.  Patient does have dementia however tells me that she has no complaints. I have a low index of suspicion for traumatic musculoskeletal or spinal injury based on my exam. Ordered CT head to rule out intracranial hemorrhage or evidence of large vascular territory infarct. Low index of suspicion for CVA based on reassuring neurologic exam. It is also apparent that she has an upper respiratory tract infection.  Could have pneumonia however afebrile and not hypoxic.  Ordered chest x-ray and COVID/influenza testing.  Will also obtain blood work to assess for metabolic/electrolyte derangements as well as urinalysis to rule out urinary tract infection.  Please see ED clinical course above for further medical decision-making.  Patient has been having URI symptoms for almost 1 week. Does not meet criteria for COVID treatment. No evidence of UTI Safe and stable for d/c home with outpatient follow up within the next 3-5 days as needed.  Final Clinical Impression(s) / ED Diagnoses Final diagnoses:  COVID-19 virus infection  Rx / DC Orders ED Discharge Orders    None        Pearson Grippe, DO 06/19/20 1500    Quintella Reichert, MD 06/19/20 1745

## 2020-06-18 NOTE — ED Triage Notes (Signed)
Family reports 2 falls in the past week.  Reports L sided weakness that is new from previous strokes and unsteady gait.  No arm or leg drift.  Pt states she feels fine.  No known injury from fall.  Also reports confusion.

## 2020-06-19 ENCOUNTER — Telehealth: Payer: Self-pay | Admitting: Family Medicine

## 2020-06-19 DIAGNOSIS — U071 COVID-19: Secondary | ICD-10-CM

## 2020-06-19 LAB — URINALYSIS, ROUTINE W REFLEX MICROSCOPIC
Bacteria, UA: NONE SEEN
Bilirubin Urine: NEGATIVE
Glucose, UA: 50 mg/dL — AB
Hgb urine dipstick: NEGATIVE
Ketones, ur: 20 mg/dL — AB
Leukocytes,Ua: NEGATIVE
Nitrite: NEGATIVE
Protein, ur: 30 mg/dL — AB
Specific Gravity, Urine: 1.016 (ref 1.005–1.030)
pH: 5 (ref 5.0–8.0)

## 2020-06-19 MED ORDER — BENZONATATE 100 MG PO CAPS: 100.0000 mg | ORAL_CAPSULE | Freq: Two times a day (BID) | ORAL | 0 refills | Status: DC | PRN

## 2020-06-19 NOTE — Discharge Instructions (Signed)
You have tested positive for COVID-19.  This is likely the cause of your productive cough, decreased activity and fatigue. Your chest x-ray does not show evidence of pneumonia which is reassuring. Your blood work is also reassuring and does not show any new abnormalities that require further testing at this time. Please see attached information on how to treat symptoms of COVID-19 infection at home. Please schedule an appointment with your primary care doctor to be evaluated within the next 3-5 days especially if symptoms worsen. If you develop shortness of breath, chest pain please return to the emergency department.

## 2020-06-19 NOTE — Telephone Encounter (Signed)
Attempted to call daughter in law [name]) about grandmother Kush).  Recommend Tessalon pearles (at pharmacy) and Mucinex---asked DIL to call back with questions.  Terisa Starr, MD  Family Medicine Teaching Service

## 2020-06-22 ENCOUNTER — Emergency Department (HOSPITAL_COMMUNITY): Payer: Medicare HMO

## 2020-06-22 ENCOUNTER — Inpatient Hospital Stay (HOSPITAL_COMMUNITY): Payer: Medicare HMO

## 2020-06-22 ENCOUNTER — Telehealth: Payer: Self-pay | Admitting: Family Medicine

## 2020-06-22 ENCOUNTER — Inpatient Hospital Stay (HOSPITAL_COMMUNITY)
Admission: EM | Admit: 2020-06-22 | Discharge: 2020-06-28 | DRG: 177 | Disposition: A | Discharge status: Home Health | Payer: Medicare HMO | Attending: Family Medicine | Admitting: Family Medicine

## 2020-06-22 DIAGNOSIS — G9341 Metabolic encephalopathy: Secondary | ICD-10-CM | POA: Diagnosis present

## 2020-06-22 DIAGNOSIS — R296 Repeated falls: Secondary | ICD-10-CM | POA: Diagnosis present

## 2020-06-22 DIAGNOSIS — E785 Hyperlipidemia, unspecified: Secondary | ICD-10-CM | POA: Diagnosis present

## 2020-06-22 DIAGNOSIS — F0150 Vascular dementia without behavioral disturbance: Secondary | ICD-10-CM | POA: Diagnosis present

## 2020-06-22 DIAGNOSIS — Z7982 Long term (current) use of aspirin: Secondary | ICD-10-CM

## 2020-06-22 DIAGNOSIS — Z66 Do not resuscitate: Secondary | ICD-10-CM | POA: Diagnosis present

## 2020-06-22 DIAGNOSIS — R0602 Shortness of breath: Secondary | ICD-10-CM | POA: Diagnosis not present

## 2020-06-22 DIAGNOSIS — I69392 Facial weakness following cerebral infarction: Secondary | ICD-10-CM | POA: Diagnosis not present

## 2020-06-22 DIAGNOSIS — I517 Cardiomegaly: Secondary | ICD-10-CM | POA: Diagnosis not present

## 2020-06-22 DIAGNOSIS — I69354 Hemiplegia and hemiparesis following cerebral infarction affecting left non-dominant side: Secondary | ICD-10-CM | POA: Diagnosis not present

## 2020-06-22 DIAGNOSIS — R410 Disorientation, unspecified: Secondary | ICD-10-CM | POA: Diagnosis not present

## 2020-06-22 DIAGNOSIS — J1282 Pneumonia due to coronavirus disease 2019: Secondary | ICD-10-CM | POA: Diagnosis not present

## 2020-06-22 DIAGNOSIS — Z8249 Family history of ischemic heart disease and other diseases of the circulatory system: Secondary | ICD-10-CM | POA: Diagnosis not present

## 2020-06-22 DIAGNOSIS — E1122 Type 2 diabetes mellitus with diabetic chronic kidney disease: Secondary | ICD-10-CM | POA: Diagnosis not present

## 2020-06-22 DIAGNOSIS — Z28310 Unvaccinated for covid-19: Secondary | ICD-10-CM

## 2020-06-22 DIAGNOSIS — E1165 Type 2 diabetes mellitus with hyperglycemia: Secondary | ICD-10-CM | POA: Diagnosis not present

## 2020-06-22 DIAGNOSIS — T380X5A Adverse effect of glucocorticoids and synthetic analogues, initial encounter: Secondary | ICD-10-CM | POA: Diagnosis not present

## 2020-06-22 DIAGNOSIS — I129 Hypertensive chronic kidney disease with stage 1 through stage 4 chronic kidney disease, or unspecified chronic kidney disease: Secondary | ICD-10-CM | POA: Diagnosis present

## 2020-06-22 DIAGNOSIS — A0839 Other viral enteritis: Secondary | ICD-10-CM | POA: Diagnosis present

## 2020-06-22 DIAGNOSIS — I10 Essential (primary) hypertension: Secondary | ICD-10-CM

## 2020-06-22 DIAGNOSIS — R4182 Altered mental status, unspecified: Secondary | ICD-10-CM | POA: Diagnosis not present

## 2020-06-22 DIAGNOSIS — E1140 Type 2 diabetes mellitus with diabetic neuropathy, unspecified: Secondary | ICD-10-CM | POA: Diagnosis not present

## 2020-06-22 DIAGNOSIS — Z79899 Other long term (current) drug therapy: Secondary | ICD-10-CM | POA: Diagnosis not present

## 2020-06-22 DIAGNOSIS — R69 Illness, unspecified: Secondary | ICD-10-CM | POA: Diagnosis not present

## 2020-06-22 DIAGNOSIS — W19XXXA Unspecified fall, initial encounter: Secondary | ICD-10-CM | POA: Diagnosis present

## 2020-06-22 DIAGNOSIS — N183 Chronic kidney disease, stage 3 unspecified: Secondary | ICD-10-CM | POA: Diagnosis not present

## 2020-06-22 DIAGNOSIS — R5383 Other fatigue: Secondary | ICD-10-CM | POA: Diagnosis not present

## 2020-06-22 DIAGNOSIS — E559 Vitamin D deficiency, unspecified: Secondary | ICD-10-CM | POA: Diagnosis present

## 2020-06-22 DIAGNOSIS — Z743 Need for continuous supervision: Secondary | ICD-10-CM | POA: Diagnosis not present

## 2020-06-22 DIAGNOSIS — E860 Dehydration: Secondary | ICD-10-CM | POA: Diagnosis present

## 2020-06-22 DIAGNOSIS — U071 COVID-19: Secondary | ICD-10-CM | POA: Diagnosis not present

## 2020-06-22 DIAGNOSIS — R001 Bradycardia, unspecified: Secondary | ICD-10-CM | POA: Diagnosis not present

## 2020-06-22 DIAGNOSIS — J9601 Acute respiratory failure with hypoxia: Secondary | ICD-10-CM | POA: Diagnosis present

## 2020-06-22 DIAGNOSIS — G319 Degenerative disease of nervous system, unspecified: Secondary | ICD-10-CM | POA: Diagnosis not present

## 2020-06-22 DIAGNOSIS — R208 Other disturbances of skin sensation: Secondary | ICD-10-CM | POA: Diagnosis not present

## 2020-06-22 DIAGNOSIS — R404 Transient alteration of awareness: Secondary | ICD-10-CM | POA: Diagnosis not present

## 2020-06-22 DIAGNOSIS — R0902 Hypoxemia: Secondary | ICD-10-CM

## 2020-06-22 LAB — CBC WITH DIFFERENTIAL/PLATELET
Abs Immature Granulocytes: 0 10*3/uL (ref 0.00–0.07)
Basophils Absolute: 0 10*3/uL (ref 0.0–0.1)
Basophils Relative: 0 %
Eosinophils Absolute: 0 10*3/uL (ref 0.0–0.5)
Eosinophils Relative: 0 %
HCT: 39 % (ref 36.0–46.0)
Hemoglobin: 12 g/dL (ref 12.0–15.0)
Lymphocytes Relative: 7 %
Lymphs Abs: 0.5 10*3/uL — ABNORMAL LOW (ref 0.7–4.0)
MCH: 26.3 pg (ref 26.0–34.0)
MCHC: 30.8 g/dL (ref 30.0–36.0)
MCV: 85.3 fL (ref 80.0–100.0)
Monocytes Absolute: 0.1 10*3/uL (ref 0.1–1.0)
Monocytes Relative: 1 %
Neutro Abs: 6.4 10*3/uL (ref 1.7–7.7)
Neutrophils Relative %: 92 %
Platelets: 232 10*3/uL (ref 150–400)
RBC: 4.57 MIL/uL (ref 3.87–5.11)
RDW: 15 % (ref 11.5–15.5)
WBC: 7 10*3/uL (ref 4.0–10.5)
nRBC: 0 % (ref 0.0–0.2)
nRBC: 2 /100 WBC — ABNORMAL HIGH

## 2020-06-22 LAB — COMPREHENSIVE METABOLIC PANEL
ALT: 22 U/L (ref 0–44)
AST: 42 U/L — ABNORMAL HIGH (ref 15–41)
Albumin: 3.1 g/dL — ABNORMAL LOW (ref 3.5–5.0)
Alkaline Phosphatase: 69 U/L (ref 38–126)
Anion gap: 12 (ref 5–15)
BUN: 13 mg/dL (ref 8–23)
CO2: 25 mmol/L (ref 22–32)
Calcium: 8.9 mg/dL (ref 8.9–10.3)
Chloride: 105 mmol/L (ref 98–111)
Creatinine, Ser: 0.8 mg/dL (ref 0.44–1.00)
GFR, Estimated: >60 mL/min (ref >60)
Glucose, Bld: 107 mg/dL — ABNORMAL HIGH (ref 70–99)
Potassium: 3.6 mmol/L (ref 3.5–5.1)
Sodium: 142 mmol/L (ref 135–145)
Total Bilirubin: 0.7 mg/dL (ref 0.3–1.2)
Total Protein: 7.4 g/dL (ref 6.5–8.1)

## 2020-06-22 LAB — C-REACTIVE PROTEIN: CRP: 15.4 mg/dL — ABNORMAL HIGH (ref <1.0)

## 2020-06-22 LAB — PROCALCITONIN: Procalcitonin: <0.1 ng/mL

## 2020-06-22 LAB — PROTIME-INR
INR: 1 (ref 0.8–1.2)
Prothrombin Time: 13.7 seconds (ref 11.4–15.2)

## 2020-06-22 LAB — FIBRINOGEN: Fibrinogen: 752 mg/dL — ABNORMAL HIGH (ref 210–475)

## 2020-06-22 LAB — TRIGLYCERIDES: Triglycerides: 92 mg/dL (ref <150)

## 2020-06-22 LAB — D-DIMER, QUANTITATIVE: D-Dimer, Quant: 3.93 ug/mL-FEU — ABNORMAL HIGH (ref 0.00–0.50)

## 2020-06-22 LAB — LACTATE DEHYDROGENASE: LDH: 260 U/L — ABNORMAL HIGH (ref 98–192)

## 2020-06-22 LAB — FERRITIN: Ferritin: 436 ng/mL — ABNORMAL HIGH (ref 11–307)

## 2020-06-22 LAB — LACTIC ACID, PLASMA: Lactic Acid, Venous: 1.2 mmol/L (ref 0.5–1.9)

## 2020-06-22 MED ORDER — ASPIRIN EC 81 MG PO TBEC
81.0000 mg | DELAYED_RELEASE_TABLET | Freq: Every day | ORAL | Status: DC
  Administered 2020-06-23 – 2020-06-27 (×6): 81 mg via ORAL
  Filled 2020-06-22 (×6): qty 1

## 2020-06-22 MED ORDER — INSULIN ASPART 100 UNIT/ML IJ SOLN
0.0000 [IU] | Freq: Three times a day (TID) | INTRAMUSCULAR | Status: DC
  Administered 2020-06-23: 2 [IU] via SUBCUTANEOUS
  Administered 2020-06-23: 3 [IU] via SUBCUTANEOUS
  Administered 2020-06-23 – 2020-06-24 (×2): 2 [IU] via SUBCUTANEOUS
  Administered 2020-06-24: 5 [IU] via SUBCUTANEOUS
  Administered 2020-06-24 – 2020-06-25 (×2): 2 [IU] via SUBCUTANEOUS
  Administered 2020-06-25: 3 [IU] via SUBCUTANEOUS
  Administered 2020-06-25: 5 [IU] via SUBCUTANEOUS
  Administered 2020-06-26: 7 [IU] via SUBCUTANEOUS
  Administered 2020-06-26: 2 [IU] via SUBCUTANEOUS
  Administered 2020-06-26: 7 [IU] via SUBCUTANEOUS
  Administered 2020-06-27: 3 [IU] via SUBCUTANEOUS
  Administered 2020-06-27 (×2): 2 [IU] via SUBCUTANEOUS

## 2020-06-22 MED ORDER — AMLODIPINE BESYLATE 5 MG PO TABS
2.5000 mg | ORAL_TABLET | Freq: Every day | ORAL | Status: DC
  Administered 2020-06-23 (×2): 2.5 mg via ORAL
  Filled 2020-06-22 (×2): qty 1

## 2020-06-22 MED ORDER — ATORVASTATIN CALCIUM 10 MG PO TABS
20.0000 mg | ORAL_TABLET | Freq: Every day | ORAL | Status: DC
  Administered 2020-06-23 – 2020-06-27 (×6): 20 mg via ORAL
  Filled 2020-06-22 (×6): qty 2

## 2020-06-22 MED ORDER — SODIUM CHLORIDE 0.9 % IV SOLN
100.0000 mg | Freq: Every day | INTRAVENOUS | Status: AC
  Administered 2020-06-24 – 2020-06-27 (×4): 100 mg via INTRAVENOUS
  Filled 2020-06-22 (×2): qty 20
  Filled 2020-06-22: qty 100
  Filled 2020-06-22: qty 20

## 2020-06-22 MED ORDER — DEXAMETHASONE 4 MG PO TABS
6.0000 mg | ORAL_TABLET | ORAL | Status: DC
  Administered 2020-06-23 – 2020-06-25 (×3): 6 mg via ORAL
  Filled 2020-06-22 (×4): qty 2

## 2020-06-22 MED ORDER — ENOXAPARIN SODIUM 40 MG/0.4ML IJ SOSY
40.0000 mg | PREFILLED_SYRINGE | INTRAMUSCULAR | Status: DC
  Administered 2020-06-23 – 2020-06-27 (×4): 40 mg via SUBCUTANEOUS
  Filled 2020-06-22 (×4): qty 0.4

## 2020-06-22 MED ORDER — SODIUM CHLORIDE 0.9 % IV BOLUS
500.0000 mL | Freq: Once | INTRAVENOUS | Status: AC
  Administered 2020-06-22: 500 mL via INTRAVENOUS

## 2020-06-22 MED ORDER — SODIUM CHLORIDE 0.9 % IV SOLN
200.0000 mg | Freq: Once | INTRAVENOUS | Status: AC
  Administered 2020-06-23: 200 mg via INTRAVENOUS
  Filled 2020-06-22: qty 40

## 2020-06-22 NOTE — ED Notes (Signed)
Family med at bedside and are ok and aware of pt going to MRI before being transferred to floor. Pt does not need monitoring for transport

## 2020-06-22 NOTE — ED Provider Notes (Signed)
[facility] EMERGENCY DEPARTMENT Provider Note   CSN: [id number] Arrival date & time: 06/22/20  1305     History Chief Complaint  Patient presents with   Fatigue    COVID positive     Mary Walters is a 87 y.o. female.  87 year old female with prior medical history as detailed below presents for evaluation.  Patient with previously diagnosed COVID.  Patient is returning to ED with complaint of decreased p.o. intake.  Level 5 caveat secondary to patient's dementia.  History is obtained from the patient's daughter through a phone contact.  The history is provided by the patient and medical records.  Illness Location:  COVID-positive, decreased p.o. 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   Urge incontinence 09/05/2017   Vascular dementia of acute onset without behavioral disturbance ([place]) 09/05/2017    Patient Active Problem List   Diagnosis Date Noted   Vitamin D deficiency 04/10/2020   Alkaline phosphatase elevation 09/04/2018   Type 2 diabetes mellitus with vascular disease ([place]) 08/19/2018   Diabetic neuropathy ([place]) 08/19/2018   Hypertensive urgency 04/10/2018   Fall 12/08/2017   Vascular dementia of acute onset without behavioral disturbance ([place]) 09/05/2017   Urge incontinence 09/05/2017   Decreased sensation of foot 09/04/2017   Onychomycosis of toenail 09/04/2017   Memory difficulties 08/06/2017   HLD (hyperlipidemia) 06/02/2017   CKD (chronic kidney disease), stage III ([place]) 06/02/2017   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   OB History   No obstetric history on file.     Family History  Problem Relation Age of Onset   Hypertension Mother     Social History   Tobacco Use   Smoking status: Never   Smokeless tobacco: Never  Vaping Use   Vaping Use: Never used  Substance Use Topics   Alcohol use: Yes    Alcohol/week: 2.0 standard drinks    Types: 1 Glasses of wine, 1 Shots of liquor per week    Comment: Patient occasionally drinks wine.  She talks about rum but her grandchildren say she does not buy any   Drug use: Never    Home Medications Prior to Admission medications   Medication Sig Start Date End Date Taking? Authorizing Provider  acetaminophen (TYLENOL) 325 MG tablet Take 650 mg by mouth every 6 (six) hours as needed. Patient not taking: No sig reported    [provider]  amLODipine (NORVASC) 2.5 MG tablet TAKE 1 TABLET BY MOUTH AT BEDTIME. Patient taking differently: Take 2.5 mg by mouth at bedtime. 05/08/20   Wilber Oliphant, MD  aspirin EC 81 MG tablet Take 81 mg by mouth at bedtime.    [provider]  atorvastatin (LIPITOR) 20 MG tablet TAKE 1 TABLET BY MOUTH EVERY DAY Patient taking differently: Take 20 mg by mouth at bedtime. 12/14/19   Wilber Oliphant, MD  benzonatate (TESSALON) 100 MG capsule Take 1 capsule (100 mg total) by mouth 2 (two) times daily as needed for cough. 06/19/20   [place] Shark,  Raiford Simmonds, MD  Blood Glucose Monitoring Suppl (ONETOUCH VERIO FLEX SYSTEM) w/Device KIT Check once daily in the morning before breakfast 06/11/18   Martyn Malay, MD  candesartan (ATACAND) 8 MG tablet TAKE 1 TABLET (8 MG TOTAL) BY MOUTH AT BEDTIME. 08/25/19   Wilber Oliphant, MD  Cholecalciferol (VITAMIN D) 50 MCG (2000 UT) tablet Take 2,000 Units by mouth daily. Patient not taking: No sig reported    [provider]  glucose blood test strip Use as instructed 06/11/18   Martyn Malay, MD  glycerin adult 2 g  suppository Place 1 suppository rectally as needed for constipation. Patient not taking: No sig reported 03/15/19   Martyn Malay, MD  Lancet Devices (ONE TOUCH DELICA LANCING DEV) MISC 1 Device by Does not apply route daily as needed. 06/02/17   Glenis Smoker, MD  OneTouch Delica Lancets 41O MISC 1 Units by Does not apply route daily as needed. 06/11/18   Martyn Malay, MD    Allergies    Patient has no known allergies.  Review of Systems   Review of Systems  All other systems reviewed and are negative.  Physical Exam Updated Vital Signs BP (!) 178/84   Pulse 93   Temp 99.9 F (37.7 C) (Oral)   Resp 10   SpO2 92%   Physical Exam Vitals and nursing note reviewed.  Constitutional:      General: She is not in acute distress.    Appearance: She is well-developed.  HENT:     Head: Normocephalic and atraumatic.     Mouth/Throat:     Mouth: Mucous membranes are dry.  Eyes:     Conjunctiva/sclera: Conjunctivae normal.     Pupils: Pupils are equal, round, and reactive to light.  Cardiovascular:     Rate and Rhythm: Normal rate and regular rhythm.     Heart sounds: Normal heart sounds.  Pulmonary:     Effort: Pulmonary effort is normal. No respiratory distress.     Breath sounds: Normal breath sounds.  Abdominal:     General: There is no distension.     Palpations: Abdomen is soft.     Tenderness: There is no abdominal tenderness.  Musculoskeletal:        General: No deformity. Normal range of motion.     Cervical back: Normal range of motion and neck supple.  Skin:    General: Skin is warm and dry.  Neurological:     General: No focal deficit present.     Mental Status: She is alert.    ED Results / Procedures / Treatments   Labs (all labs ordered are listed, but only abnormal results are displayed) Labs Reviewed  D-DIMER, QUANTITATIVE (NOT AT [facility]) - Abnormal; Notable for the following components:      Result Value   D-Dimer, Quant 3.93 (*)    All other  components within normal limits  FIBRINOGEN - Abnormal; Notable for the following components:   Fibrinogen 752 (*)    All other components within normal limits  CULTURE, BLOOD (ROUTINE X 2)  CULTURE, BLOOD (ROUTINE X 2)  CBC WITH DIFFERENTIAL/PLATELET  LACTIC ACID, PLASMA  PROTIME-INR  URINALYSIS, ROUTINE W REFLEX MICROSCOPIC  COMPREHENSIVE METABOLIC PANEL  LACTIC ACID, PLASMA  FERRITIN  C-REACTIVE PROTEIN  LACTATE DEHYDROGENASE  PROCALCITONIN  TRIGLYCERIDES    EKG EKG Interpretation  Date/Time:  Thursday June 22 2020 13:21:01 EDT Ventricular Rate:  94 PR Interval:    QRS Duration: 77 QT  Interval:  360 QTC Calculation: 451 R Axis:   -30 Text Interpretation: Normal sinus rhythm Inferior infarct, old Confirmed by Dene Gentry [phone]) on 06/22/2020 1:26:49 PM  Radiology DG Chest Port 1 View  Result Date: 06/22/2020 CLINICAL DATA:  Shortness of breath and fatigue. EXAM: PORTABLE CHEST 1 VIEW COMPARISON:  06/18/2020 FINDINGS: Numerous leads and wires project over the chest. Patient rotated left. Cardiomegaly accentuated by AP portable technique. No right and no definite left pleural effusion. No pneumothorax. Left mid lung and probable right medial lower lobe airspace and interstitial opacity, new. Left lower lobe not well evaluated. IMPRESSION: Development of left and possible right-sided interstitial/airspace disease, with differential considerations of multifocal infection versus asymmetric pulmonary edema. Cardiomegaly. Electronically Signed   By: Abigail Miyamoto M.D.   On: 06/22/2020 14:25    Procedures Procedures   Medications Ordered in ED Medications  sodium chloride 0.9 % bolus 500 mL (0 mLs Intravenous Stopped 06/22/20 1551)    ED Course  I have reviewed the triage vital signs and the nursing notes.  Pertinent labs & imaging results that were available during my care of the patient were reviewed by me and considered in my medical decision making (see chart for  details).    MDM Rules/Calculators/A&P                          MDM  MSE complete  Mary Walters was evaluated in Emergency Department on 06/22/2020 for the symptoms described in the history of present illness. She was evaluated in the context of the global COVID-19 pandemic, which necessitated consideration that the patient might be at risk for infection with the SARS-CoV-2 virus that causes COVID-19. Institutional protocols and algorithms that pertain to the evaluation of patients at risk for COVID-19 are in a state of rapid change based on information released by regulatory bodies including the CDC and federal and state organizations. These policies and algorithms were followed during the patient's care in the ED.   Patient is presenting with decreased p.o. intake and dehydration in the setting of known COVID infection.  Patient is not vaccinated for COVID.  Patient's daughter reports that the patient is a DNR/DNI.  Patient is noted to be mildly hypoxic into the low 90s.  Chest x-ray is suggestive of early COVID-pneumonia.  Family medicine teaching team is aware of case and accepts the patient in admission.   Final Clinical Impression(s) / ED Diagnoses Final diagnoses:  [id number]-06  Hypoxia    Rx / DC Orders ED Discharge Orders     None        Valarie Merino, MD 06/22/20 1558

## 2020-06-22 NOTE — H&P (Addendum)
[facility] Admission History and Physical Service Pager: [phone]  Patient name: Mary Walters Medical record number: [id number] Date of birth: [birth year] Age: 87 y.o. Gender: female  Primary Care Provider: Wilber Oliphant, MD Consultants: None Code Status: DNR Preferred Emergency Contact: Zada [phone]  Chief Complaint: Weakness  Assessment and Plan: Mary Walters is a 87 y.o. female presenting with falls and altered mental status with known COVID-19 infection admitted for COVID pneumonia and acute encephalopathy. PMH is significant for HTN, HLD, CVA, Type 2 Diabetes, and Dementia.   COVID-19 Pneumonia Likely cause of symptoms given cough, fevers, n/v/d, and weakness & confusion.  Tested positive on 6/5.  Currently hemodynamically stable.  Patient currently breathing adequately on RA with saturations in low 90's.  Will likely need supplemental O2 during hospitalization.  Chest X-ray shows diffuse infiltrates fitting with viral pneumonia.  Rales on exam, will get a BNP and consider ECHO following results. CBC, BMP overall normal.  Ferritin, CRP, LDH, Fibrinogen all elevated in setting of COVID infection.  Lactic acid and Procal normal.  Less concern for superimposed bacterial pneumonia given these labs and chest X-ray appearance. D-Dimer elevated at 3.93. Treat with Remdesivir and Decadron.  Wells score low risk defer CTA Chest at this time with low threshold for CTA given symptoms.  - Admit to FPTS, Attending Dr Owens Shark - Continue to monitor symptoms and O2 requirement - Remdesivir per Pharmacy consult - Decadron 6 mg - Vital signs per floor - Continuous Pulse Ox, keep Sats >88% - follow up blood culture - Follow up UA, blood cultures, LA - AM labs: D-Dimer, BMP, and CBC - Encourage incentive spirometry    Acute Encephalopathy  Vascular Dementia  History of CVA   Has dementia and residual left-sided weakness, at baseline. Per family,  pt is much more confused and new left sided facial droop (not appreciated on exam). She is requiring more assistance to get move. CT Head on 06/18/2020 without acute findings. Given change in mental status. It is reasonable to order MRI for possible stroke given history of CVA.  Could also be delerium in setting of acute illness or worsening of dementia.  Pt not hyponatremic or hypoglycemic, doubt metabolic cause. - follow up MRI, consider touching base with Neuro  - Fall precautions - Delirium precautions  - Soft Diet prior to swallow, advanced as tolerated  - PT/OT/SLP eval and treat - Continue home aspirin 81 mg and statin  Hypertension  BP's elevated 160's-170's.  Takes Amlodipine 5 mg.   - Monitor Blood pressures - Continue Amlodipine 5 mg  Diarrhea Patient has been having multiple episodes of diarrhea in the setting of COVID infection. She is hypertensive therefore will hold mIVFs for now.  - Consider IVF if not maintaining hydration - Monitor Is/Os   Hx of Type 2 Diabetes Glucose on admission 107. Last a1c 6.1 I March 2022. Well controlled without medication.  - sSSI  - Monitor CBGs   Goals of Care Patient transitioned from FULL to DNR. Verified with daughter-in-law who lives at home with patient.   FEN/GI: Soft Diet Prophylaxis: Lovenox  Disposition: Admit to progressive floor  History of Present Illness:  Mary Walters is a 87 y.o. female presenting with weakness.  History limited as patient has dementia. History provided by daughter-in-law.  Indicates patient has had weakness that started 1 week ago.  Indicates over the weekend has multiple falls witnessed by family.  Has gradually gotten worse over this time.  Has had cough, fever, n/v/d over this time period.  Also feels she has new left sided facial droop.  Has mild dementia at baseline, but has been much more confused over this time.  Hard to assess for chest pain or difficulty breathing given confusion.  Did not  receive COVID-19 vaccine.  No history of cardiac issues.  No history of smoking  Review Of Systems: Per HPI with the following additions:   Review of Systems  Unable to perform ROS: Dementia  Constitutional:  Positive for fever and malaise/fatigue.  Respiratory:  Positive for cough.   Gastrointestinal:  Positive for diarrhea, nausea and vomiting.  Neurological:  Positive for weakness.   Patient Active Problem List   Diagnosis Date Noted   Pneumonia due to COVID-19 virus 06/22/2020   Vitamin D deficiency 04/10/2020   Alkaline phosphatase elevation 09/04/2018   Type 2 diabetes mellitus with vascular disease ([place]) 08/19/2018   Diabetic neuropathy ([place]) 08/19/2018   Hypertensive urgency 04/10/2018   Fall 12/08/2017   Vascular dementia of acute onset without behavioral disturbance ([place]) 09/05/2017   Urge incontinence 09/05/2017   Decreased sensation of foot 09/04/2017   Onychomycosis of toenail 09/04/2017   Memory difficulties 08/06/2017   HLD (hyperlipidemia) 06/02/2017   CKD (chronic kidney disease), stage III ([place]) 06/02/2017   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  Social History: Social History   Tobacco Use   Smoking status: Never   Smokeless tobacco: Never  Vaping Use   Vaping Use: Never used  Substance Use Topics   Alcohol use: Yes    Alcohol/week: 2.0 standard drinks    Types: 1 Glasses of wine, 1 Shots of liquor per week    Comment: Patient  occasionally drinks wine.  She talks about rum but her grandchildren say she does not buy any   Drug use: Never   Additional social history: Lives at home with son and daughter-in-law.    Please also refer to relevant sections of EMR.  Family History: Family History  Problem Relation Age of Onset   Hypertension Mother      Allergies and Medications: No Known Allergies No current facility-administered medications on file prior to encounter.   Current Outpatient Medications on File Prior to Encounter  Medication Sig Dispense Refill   acetaminophen (TYLENOL) 325 MG tablet Take 650 mg by mouth every 6 (six) hours as needed.     amLODipine (NORVASC) 2.5 MG tablet TAKE 1 TABLET BY MOUTH AT BEDTIME. (Patient taking differently: Take 2.5 mg by mouth at bedtime.) 90 tablet 1   aspirin EC 81 MG tablet Take 81 mg by mouth at bedtime.     atorvastatin (LIPITOR) 20 MG tablet TAKE 1 TABLET BY MOUTH EVERY DAY (Patient taking differently: Take 20 mg by mouth at bedtime.) 90 tablet 3   benzonatate (TESSALON) 100 MG capsule Take 1 capsule (100 mg total) by mouth 2 (two) times daily as needed for cough. 20 capsule 0   Cholecalciferol (VITAMIN D) 50 MCG (2000 UT)  tablet Take 2,000 Units by mouth daily.     Blood Glucose Monitoring Suppl (ONETOUCH VERIO FLEX SYSTEM) w/Device KIT Check once daily in the morning before breakfast 1 kit 3   glucose blood test strip Use as instructed 100 each 12   Lancet Devices (ONE TOUCH DELICA LANCING DEV) MISC 1 Device by Does not apply route daily as needed. 1 each 3   OneTouch Delica Lancets 31V MISC 1 Units by Does not apply route daily as needed. 100 each 1    Objective: BP (!) 169/142   Pulse (!) 101   Temp 99.9 F (37.7 C) (Oral)   Resp 20   SpO2 97%  Exam:  Physical Exam Constitutional:      General: She is not in acute distress.    Appearance: Normal appearance. She is not toxic-appearing.  HENT:     Head: Normocephalic and atraumatic.     Nose: Nose  normal. No rhinorrhea.     Mouth/Throat:     Mouth: Mucous membranes are moist.     Pharynx: Oropharynx is clear.  Eyes:     Extraocular Movements: Extraocular movements intact.     Conjunctiva/sclera: Conjunctivae normal.  Cardiovascular:     Rate and Rhythm: Normal rate and regular rhythm.     Pulses: Normal pulses.  Pulmonary:     Effort: Pulmonary effort is normal. No respiratory distress.     Breath sounds: Rales present. No wheezing or rhonchi.  Abdominal:     General: Bowel sounds are normal. There is no distension.     Palpations: Abdomen is soft.     Tenderness: There is no abdominal tenderness. There is no guarding or rebound.  Musculoskeletal:        General: No swelling, tenderness or deformity.     Cervical back: Neck supple. No tenderness.  Lymphadenopathy:     Cervical: No cervical adenopathy.  Skin:    General: Skin is warm and dry.     Capillary Refill: Capillary refill takes less than 2 seconds.     Comments: Dry scaly skin on bilateral shins and feet  Neurological:     General: No focal deficit present.     Mental Status: She is alert.     Motor: No weakness.     Comments: Alert and Oriented to person and place only, follows most commands appropriately, unable to recognize daughter in law, 5/5 strength bilaterally UE and LE, CN II-XII grossly intact     Labs and Imaging: CBC BMET  Recent Labs  Lab 06/22/20 1440  WBC 7.0  HGB 12.0  HCT 39.0  PLT 232   Recent Labs  Lab 06/22/20 1440  NA 142  K 3.6  CL 105  CO2 25  BUN 13  CREATININE 0.80  GLUCOSE 107*  CALCIUM 8.9     EKG: Nomral Sinus Rhythm, HR 94, No acute ST or T wave changes   [name]  Result Date: 06/22/2020 CLINICAL DATA:  Mental status changes of unknown cause.  Confusion. EXAM: MRI HEAD WITHOUT CONTRAST TECHNIQUE: Multiplanar, multiecho pulse sequences of the brain and surrounding structures were obtained without intravenous contrast. COMPARISON:  Head CT 06/18/2020.   MRI 04/10/2018. FINDINGS: Brain: Diffusion imaging does not show any acute or subacute infarction. Extensive chronic small-vessel ischemic changes are present throughout the pons. Generalized cerebellar atrophy. Cerebral hemispheres show generalized atrophy with confluent chronic small vessel ischemic changes of the white matter. No large vessel territory infarction. No mass lesion, hemorrhage, hydrocephalus  or extra-axial collection. No apparent change since the study of March 2020. Vascular: Major vessels at the base of the brain show flow. Skull and upper cervical spine: Negative Sinuses/Orbits: Clear/normal Other: None IMPRESSION: No acute finding by MRI. Generalized atrophy with advanced chronic small-vessel ischemic changes throughout the brain, similar to previous studies. Electronically Signed   By: Nelson Chimes M.D.   On: 06/22/2020 19:45   DG Chest Port 1 View  Result Date: 06/22/2020 CLINICAL DATA:  Shortness of breath and fatigue. EXAM: PORTABLE CHEST 1 VIEW COMPARISON:  06/18/2020 FINDINGS: Numerous leads and wires project over the chest. Patient rotated left. Cardiomegaly accentuated by AP portable technique. No right and no definite left pleural effusion. No pneumothorax. Left mid lung and probable right medial lower lobe airspace and interstitial opacity, new. Left lower lobe not well evaluated. IMPRESSION: Development of left and possible right-sided interstitial/airspace disease, with differential considerations of multifocal infection versus asymmetric pulmonary edema. Cardiomegaly. Electronically Signed   By: Abigail Miyamoto M.D.   On: 06/22/2020 14:25     Delora Fuel, MD 06/22/2020, 7:59 PM PGY-1, [place] Intern pager: [phone], text pages welcome  FPTS Upper-Level Resident Addendum   I have independently interviewed and examined the patient. I have discussed the above with the original author and agree with their documentation. My edits for  correction/addition/clarification are within the document. Please see also any attending notes.   Lyndee Hensen, DO PGY-2, [facility] 06/22/2020 8:44 PM  FPTS Service pager: [phone] (text pages welcome through [place])

## 2020-06-22 NOTE — ED Notes (Signed)
Pulse ox reposition on finger.

## 2020-06-22 NOTE — ED Notes (Signed)
Status checked with MRI pt is coming next. Updated family on wait and MRI will call daughter on cellphone

## 2020-06-22 NOTE — Progress Notes (Signed)
FMTS Attending Brief  Note: Terisa Starr, MD   For questions about this patient, please use [email or web address] to page the family medicine resident on call. Pager number [phone].    I  have seen and examined this patient, reviewed their chart. I have discussed this patient with the resident. I will sign the resident note a available.  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  The patient reports she feels fine and provides a limited history.  Her daughter-in-law is at bedside.  Per the daughter-in-law the patient has not been feeling quite herself for about 2 weeks.  She initially had some cough and congestion approximately 10 days prior to presentation.  She has had ongoing diarrhea most days.  Last week she had 3 falls while they were trying to help her from the bed.  The patient is requiring more assistance getting up from bed, typically requiring 1 person to help now with 3.  She has been incontinent of stool and urine.  She said multiple episodes of diarrhea.  She has had a productive cough.  Her last fever was yesterday 1 day prior to presentation.  Daughter in law reports worsening left sided weakness as compared to baseline. The patient herself denies chest pain, difficulty breathing, headache abdominal pain or any pain at all.  PMH- Significant for type 2 diabetes, CVA, HTN, dementia   At baseline the patient lives with her family.  She requires assistance with most activities of daily living.  Typically she requires 1 person to help her get to the bathroom and do her activities of daily living.  She spends most of her time in her bedroom  Surgical and family history reviewed socially the patient lives with her family and usually is alert and oriented to person at  least place and sometimes day.  She is able to recognize family members and carry on a general conversation.  Vital signs reviewed and notable for hypertension and an oxygen saturation less than 94%.  Edentulous appearing with a mild left facial droop that activates appropriately Pupils are equal round and reactive Unable to fully test extraocular muscles Has a normal speech Has full range of motion of bilateral upper extremities and is able to raise arms above her head, in July weight Oriented to person and knows she is in the hospital she does not know the date and she does not know her daughter in law who is sitting next to her Cardiac: Regular rate and rhythm. Normal S1/S2. No murmurs, rubs, or gallops appreciated. Lungs: Clear bilaterally to ascultation.  Lower extremities with thickened nails no ulcerations skin appears well cared for.  Back was not examined.  Labs are notable for an LDH of 260 elevated ferritin and a CRP of 15 procalcitonin is undetectable lactic acid 1.2 metabolic panel, ALT AST and CBC relatively unremarkable.  D-dimer and fibrinogen are both elevated as well  Chest x-ray as compared to prior demonstrates what appears to be has bilateral airspace disease greater on the left as compared to right with some rotation this is a single view chest x-ray EKG shows sinus rhythm without acute ST changes similar to prior.    Acute hypoxic respiratory failure, with oxygen saturation of 91% due to multifocal pneumonia in the setting of COVID-19.  I am concerned given her age and  underlying conditions she is at risk for progression of this disease.  Her symptoms have worsened over the past 2 days and I suspect this is the cause of her confusion.  We will treat with remdesivir, dexamethasone, oxygen to oxygen saturation greater than 94%.  Trend labs, patient be unlikely to be able to prone. Acute encephalopathy, this is likely delirium with superimposed dementia due to her COVID-19  infection.  The daughter does report worsening left-sided weakness in the context of her multiple risk factors for CVA we will obtain an MRI of the brain.  I suspect this is due to deconditioning in the setting of her COVID-19 illness Diarrhea, likely due to COVID-19 monitor for hematochezia or melena. Advanced age and goals of care, the patient is DNR/DNI but the family desires therapies for treatable conditions.  I am concerned about her risk for progression to worsening COVID-19 and hypoxemia.  We will continue discussion with family.  Will sign resident note as available. Signed out to Dr. Deirdre Priest for overnight coverage. Dr. Lum Babe daytime attending.

## 2020-06-22 NOTE — Telephone Encounter (Signed)
Called patient's daughter in law about symptoms.   Patient continues to have relatively poor PO intake, worsening weakness, confusion. Recommended ER evaluation given change in symptoms.   Will make Dr. Lum Babe and inpatient team aware.  Terisa Starr, MD  Family Medicine Teaching Service

## 2020-06-22 NOTE — ED Triage Notes (Signed)
Pt to ED via EMS from home c/o COVID positive, increased lethargy. Pt has history of dementia. Left side deficit from old stroke. Daughter Called PCP office told them pt had COVID, instructed to come to ED. No SHOB. Last VS: 95%ra. Bp: 182/108. HR #20 L Forearm, No medications given by EMS.

## 2020-06-23 DIAGNOSIS — R0902 Hypoxemia: Secondary | ICD-10-CM

## 2020-06-23 DIAGNOSIS — J1282 Pneumonia due to coronavirus disease 2019: Secondary | ICD-10-CM

## 2020-06-23 LAB — CBC WITH DIFFERENTIAL/PLATELET
Abs Immature Granulocytes: 0.12 10*3/uL — ABNORMAL HIGH (ref 0.00–0.07)
Basophils Absolute: 0 10*3/uL (ref 0.0–0.1)
Basophils Relative: 0 %
Eosinophils Absolute: 0 10*3/uL (ref 0.0–0.5)
Eosinophils Relative: 0 %
HCT: 38.4 % (ref 36.0–46.0)
Hemoglobin: 12 g/dL (ref 12.0–15.0)
Immature Granulocytes: 2 %
Lymphocytes Relative: 12 %
Lymphs Abs: 0.9 10*3/uL (ref 0.7–4.0)
MCH: 26.5 pg (ref 26.0–34.0)
MCHC: 31.3 g/dL (ref 30.0–36.0)
MCV: 85 fL (ref 80.0–100.0)
Monocytes Absolute: 0.3 10*3/uL (ref 0.1–1.0)
Monocytes Relative: 4 %
Neutro Abs: 6.2 10*3/uL (ref 1.7–7.7)
Neutrophils Relative %: 82 %
Platelets: 229 10*3/uL (ref 150–400)
RBC: 4.52 MIL/uL (ref 3.87–5.11)
RDW: 14.8 % (ref 11.5–15.5)
WBC: 7.5 10*3/uL (ref 4.0–10.5)
nRBC: 0 % (ref 0.0–0.2)

## 2020-06-23 LAB — BASIC METABOLIC PANEL
Anion gap: 13 (ref 5–15)
BUN: 13 mg/dL (ref 8–23)
CO2: 22 mmol/L (ref 22–32)
Calcium: 9.1 mg/dL (ref 8.9–10.3)
Chloride: 107 mmol/L (ref 98–111)
Creatinine, Ser: 0.8 mg/dL (ref 0.44–1.00)
GFR, Estimated: >60 mL/min (ref >60)
Glucose, Bld: 114 mg/dL — ABNORMAL HIGH (ref 70–99)
Potassium: 3.5 mmol/L (ref 3.5–5.1)
Sodium: 142 mmol/L (ref 135–145)

## 2020-06-23 LAB — GLUCOSE, CAPILLARY
Glucose-Capillary: 180 mg/dL — ABNORMAL HIGH (ref 70–99)
Glucose-Capillary: 227 mg/dL — ABNORMAL HIGH (ref 70–99)
Glucose-Capillary: 161 mg/dL — ABNORMAL HIGH (ref 70–99)
Glucose-Capillary: 154 mg/dL — ABNORMAL HIGH (ref 70–99)

## 2020-06-23 LAB — D-DIMER, QUANTITATIVE: D-Dimer, Quant: 4.17 ug/mL-FEU — ABNORMAL HIGH (ref 0.00–0.50)

## 2020-06-23 LAB — BRAIN NATRIURETIC PEPTIDE: B Natriuretic Peptide: 163.3 pg/mL — ABNORMAL HIGH (ref 0.0–100.0)

## 2020-06-23 LAB — MRSA PCR SCREENING: MRSA by PCR: NEGATIVE

## 2020-06-23 MED ORDER — DEXAMETHASONE 4 MG PO TABS
6.0000 mg | ORAL_TABLET | Freq: Once | ORAL | Status: AC
  Administered 2020-06-23: 6 mg via ORAL
  Filled 2020-06-23: qty 2

## 2020-06-23 MED ORDER — IRBESARTAN 150 MG PO TABS
75.0000 mg | ORAL_TABLET | Freq: Every day | ORAL | Status: DC
  Administered 2020-06-23 – 2020-06-27 (×5): 75 mg via ORAL
  Filled 2020-06-23 (×5): qty 1

## 2020-06-23 MED ORDER — ORAL CARE MOUTH RINSE
15.0000 mL | Freq: Two times a day (BID) | OROMUCOSAL | Status: DC
  Administered 2020-06-23 – 2020-06-27 (×9): 15 mL via OROMUCOSAL

## 2020-06-23 NOTE — TOC Initial Note (Signed)
Transition of Care [facility]) - Initial/Assessment Note    Patient Details  Name: Mary Walters MRN: [id number] Date of Birth: [birth year]  Transition of Care [facility]) CM/SW Contact:    Kermit Balo, RN Phone Number: 06/23/2020, 4:11 PM  Clinical Narrative:                 Patient is on covid isolation so CM reached out to her daughter that she lives with: [name]. [name] feels they do need the wheelchair, lift, hospital bed recommended. CM will order the DME through [employer]. There most likely will be a delay on the wheelchair as most DME companies are out of stock.  Hospital bed to be delivered to the home.  Daughter also agreeable to [facility] services. They have used [facility] in the past but open to another agency. [facility] unable to accept so [facility] will provide the services.  TOC following for further d/c needs.    Expected Discharge Plan: Home w Home Health Services Barriers to Discharge: Continued Medical Work up   Patient Goals and CMS Choice   CMS Medicare.gov Compare Post Acute Care list provided to:: Patient Represenative (must comment) Choice offered to / list presented to : Adult Children  Expected Discharge Plan and Services Expected Discharge Plan: Home w Home Health Services   Discharge Planning Services: CM Consult Post Acute Care Choice: Durable Medical Equipment, Home Health Living arrangements for the past 2 months: Single Family Home                 DME Arranged: Hospital bed, Lightweight manual wheelchair with seat cushion DME Agency: [employer] Date DME Agency Contacted: 06/23/20   Representative spoke with at DME Agency: Velna Hatchet HH Arranged: PT, OT [facility] Agency: [facility] Date [facility] Agency Contacted: 06/23/20   Representative spoke with at [facility] Agency: Marylene Land  Prior Living Arrangements/Services Living arrangements for the past 2 months: Single Family Home Lives with:: Adult Children Patient language and need for interpreter reviewed:: Yes         Need for Family Participation in Patient Care: Yes (Comment) Care giver support system in place?: Yes (comment) Current home services: DME (3 in 1) Criminal Activity/Legal Involvement Pertinent to Current Situation/Hospitalization: No - Comment as needed  Activities of Daily Living      Permission Sought/Granted                  Emotional Assessment           Psych Involvement: No (comment)  Admission diagnosis:  Hypoxia [R09.02] Pneumonia due to COVID-19 virus [U07.1, J12.82] COVID-19 [U07.1] Patient Active Problem List   Diagnosis Date Noted   Hypoxia    Pneumonia due to COVID-19 virus 06/22/2020   Vitamin D deficiency 04/10/2020   Alkaline phosphatase elevation 09/04/2018   Type 2 diabetes mellitus with vascular disease (HCC) 08/19/2018   Diabetic neuropathy (HCC) 08/19/2018   Hypertensive urgency 04/10/2018   Fall 12/08/2017   Vascular dementia of acute onset without behavioral disturbance (HCC) 09/05/2017   Urge incontinence 09/05/2017   Decreased sensation of foot 09/04/2017   Onychomycosis of toenail 09/04/2017   Memory difficulties 08/06/2017   HLD (hyperlipidemia) 06/02/2017   CKD (chronic kidney disease), stage III (HCC) 06/02/2017   Stroke (cerebrum) (HCC) 05/14/2017   Type 2 diabetes mellitus with stage 3 chronic kidney disease, without long-term current use of insulin (HCC) 05/14/2017   Hypertension 05/14/2017   PCP:  Melene Plan, MD Pharmacy:   [phone] - [place],  [place] - [address] [place] [address] Phone: [phone] Fax: [phone]     Social Determinants of Health (SDOH) Interventions    Readmission Risk Interventions Readmission Risk Prevention Plan 04/11/2018  Post Dischage Appt Complete  Medication Screening Complete  Transportation Screening Complete  Some recent data might be hidden

## 2020-06-23 NOTE — Progress Notes (Signed)
FPTS Interim Progress Note  Patient sleeping and resting comfortably. She arouses easily and has no complaints of shortness of breath, difficulty breathing or chest pain. She is pleasantly confused and oriented to person.  Did not have any desaturation on room air during assessment. Rounded with primary RN who reports patient on 2L oxygen as she dipped to low 90's while sleeping. Reports that her BP elevated and she just received her home dose Amlodipine as well as Decadron.  No concerns voiced.  No orders required.  Appreciated nightly round.  Today's Vitals   06/22/20 1855 06/23/20 0049 06/23/20 0100 06/23/20 0145  BP:  (!) 164/94  (!) 182/101  Pulse:  97  90  Resp:  11  20  Temp:  99.2 F (37.3 C)  98.2 F (36.8 C)  TempSrc:  Oral  Oral  SpO2: 97% 92%  98%  Weight:   60.3 kg   Height:   5' (1.524 m)    COVID positive Vital signs stable.  Did not have increase in oxygen requirement during exam but given reported drop in saturations while sleeping will continue prn O2 n/c.  Low suspicion for PE given  Wells score 0. Less likely CHF exacerbation given no significant history, BNP slightly elevated at 163.3  and euvolemic on exam. -Continue to monitor -Maintain O2  sats> 92% -Decadron and Remdsivir -Wean O2 as appropriate  AMS MRI negative for acute changes.  Notable for generalized atrophy with advanced chronic small vessel ischemic disease, similar to previous studies. Likely aging process.  -Continue to monitor -Delirium precautions -Fall precautions  HTN Home medication Amlodipine 2.5 mg.  Late receiving medication tonight.  -Continue to monitor -No adjustments to antihypertensives    Dana Allan, MD 06/23/2020, 3:27 AM PGY-2, [facility] Family Medicine Service pager [phone]

## 2020-06-23 NOTE — Progress Notes (Signed)
Family Medicine Teaching Service Daily Progress Note Intern Pager: [phone]  Patient name: Mary Walters Medical record number: [id number] Date of birth: 1928-05-17 Age: 87 y.o. Gender: female  Primary Care Provider: Melene Plan, MD Consultants: None Code Status: DNR  Pt Overview and Major Events to Date:  6/9 - Admitted, COVID positive   Assessment and Plan: Mary Walters is a 87 y.o. female that presented with falls and altered mental status with COVID -19, found to have pneumonia and concern for acute encephalopathy related to infection. PMH significant for HTN, HLD, CVA, Type 2 DM, and dementia  COVID-19 Pneumonia Currently on 1L O2 satting 100% with no increased WOB, patient did not have desaturations when seen by night-time provider and saturations documented are all >90%. D-dimer 4.17, CRP 15.4, BNP 163.3, WBC 7.5, ferritin 436, BMP overall wnl. If worsening respiratory status, consider CTA and echocardiogram. - Remdesivir per pharmacy (day 2/5) - Decadron 6mg  (day 2/10) - Vitals per floor routine - Continuous pulse ox with goal saturations >88%, wean oxygen as able - Follow up blood and urine cultures - Encourage incentive spirometry  Acute encephalopathy  Vascular dementia  H/o CVA Patient alert and oriented to self only. MRI was negative for acute changes. Possibly related to current sickness/infection. Liver function normal. Will continue to follow up blood and urine cultures as well as urine output and liver function  - Fall and delirium precations - PT/OT/SLP eval and treat - Diet per SLP - Continue home ASA 81mg  and statin  HTN BP 155-184/74-142 with most recent reading 155/74. Patient was given amlodipine early in the morning once transferred to the floor. Patient's home Candesartan was not included on the med-rec but patient has been taking 8mg  at night, which is not on formulary and we will substitute irbesartan. Reassuringly creatinine 0.8. - Continue  home amlodipine 2.5mg  - Irbesartan 75mg  nightly (Candesartan not on formulary) - Continue to monitor BP  Diarrhea  IVF was initially held due to hypertension, creatinine is reassuringly normal at 0.8. No documented bowel movements overnight were documented. - Monitor hydration status  Type 2 DM CBGs since admission 107-114. Well controlled without home medications. - sSSI - Monitor CBGs QAC and QHS    FEN/GI: soft diet PPx: Lovenox   Status is: Inpatient  Remains inpatient appropriate because:Inpatient level of care appropriate due to severity of illness  Dispo: The patient is from: Home              Anticipated d/c is to: Home              Patient currently is not medically stable to d/c.   Difficult to place patient No   Subjective:  Patient is doing well and states that she has no complaints. She denies difficulty breathing, chest pain, leg pain, abdominal pain.  Objective: Temp:  [97.8 F (36.6 C)-99.9 F (37.7 C)] 97.8 F (36.6 C) (06/10 0800) Pulse Rate:  [90-101] 98 (06/10 0800) Resp:  [10-23] 18 (06/10 0800) BP: (140-184)/(69-142) 140/69 (06/10 0800) SpO2:  [91 %-100 %] 100 % (06/10 0800) Weight:  [60.3 kg] 60.3 kg (06/10 0100) Physical Exam: General: NAD, supine in bed, elderly female Cardiovascular: RRR, difficult to auscultate over breathing sounds Respiratory: breathing comfortably, on 1L O2 satting 100%,  Abdomen: soft, non-tender, non-distended Extremities: BLE without tenderness or swelling  Laboratory: Recent Labs  Lab 06/18/20 1703 06/22/20 1440 06/23/20 0158  WBC 4.3 7.0 7.5  HGB 11.9* 12.0 12.0  HCT 38.0 39.0 38.4  PLT 150 232 229   Recent Labs  Lab 06/18/20 1703 06/22/20 1440 06/23/20 0158  NA 138 142 142  K 3.6 3.6 3.5  CL 106 105 107  CO2 24 25 22   BUN 10 13 13   CREATININE 0.78 0.80 0.80  CALCIUM 8.8* 8.9 9.1  PROT 6.7 7.4  --   BILITOT 0.7 0.7  --   ALKPHOS 71 69  --   ALT 17 22  --   AST 32 42*  --   GLUCOSE 176*  107* 114*     Imaging/Diagnostic Tests: [name]  Result Date: 06/22/2020 CLINICAL DATA:  Mental status changes of unknown cause.  Confusion. EXAM: MRI HEAD WITHOUT CONTRAST TECHNIQUE: Multiplanar, multiecho pulse sequences of the brain and surrounding structures were obtained without intravenous contrast. COMPARISON:  Head CT 06/18/2020.  MRI 04/10/2018. FINDINGS: Brain: Diffusion imaging does not show any acute or subacute infarction. Extensive chronic small-vessel ischemic changes are present throughout the pons. Generalized cerebellar atrophy. Cerebral hemispheres show generalized atrophy with confluent chronic small vessel ischemic changes of the white matter. No large vessel territory infarction. No mass lesion, hemorrhage, hydrocephalus or extra-axial collection. No apparent change since the study of March 2020. Vascular: Major vessels at the base of the brain show flow. Skull and upper cervical spine: Negative Sinuses/Orbits: Clear/normal Other: None IMPRESSION: No acute finding by MRI. Generalized atrophy with advanced chronic small-vessel ischemic changes throughout the brain, similar to previous studies. Electronically Signed   By: 04/12/2018 M.D.   On: 06/22/2020 19:45   DG Chest Port 1 View  Result Date: 06/22/2020 CLINICAL DATA:  Shortness of breath and fatigue. EXAM: PORTABLE CHEST 1 VIEW COMPARISON:  06/18/2020 FINDINGS: Numerous leads and wires project over the chest. Patient rotated left. Cardiomegaly accentuated by AP portable technique. No right and no definite left pleural effusion. No pneumothorax. Left mid lung and probable right medial lower lobe airspace and interstitial opacity, new. Left lower lobe not well evaluated. IMPRESSION: Development of left and possible right-sided interstitial/airspace disease, with differential considerations of multifocal infection versus asymmetric pulmonary edema. Cardiomegaly. Electronically Signed   By: 08/22/2020 M.D.   On:  06/22/2020 14:25     Jeronimo Greaves, DO 06/23/2020, 9:00 AM PGY-1,  Family Medicine FPTS Intern pager: [phone], text pages welcome

## 2020-06-23 NOTE — Progress Notes (Signed)
Pt has arrived to 2[address]26 at 0100am, on 06/23/20.Alert and oriented to self and place. Identified appropriately.  Denied chest pain and SOB, VS stable, no signs of acute distress. Cardiac monitor and continuous pulse oc in place, and CCMD notified.  Pt pleasantly confused, unable to obtain history from pt, and no family at the bedside.  Pt instructed to call for assistance, and thought how to use call bell. Bed alarm in place and call bell left within reach.  Will continue to monitor pt and treat per MD orders.

## 2020-06-23 NOTE — Progress Notes (Signed)
Hospital bed:   Length of Need 6 Months  The above medical condition requires: Patient requires the ability to reposition frequently  Head must be elevated greater than: 30 degrees  Bed type Semi-electric  Hoyer Lift Yes  Support Surface: Gel Overlay   Wheelchair:  Patient suffers from weakness which impairs their ability to perform daily activities like bathing and grooming in the home.  A walker will not resolve  issue with performing activities of daily living. A wheelchair will allow patient to safely perform daily activities. Patient is not able to propel themselves in the home using a standard weight wheelchair due to general weakness. Patient can self propel in the lightweight wheelchair. Length of need Lifetime.  Accessories: elevating leg rests (ELRs), wheel locks, extensions and anti-tippers.

## 2020-06-23 NOTE — ED Notes (Signed)
Pt linens changed, and new brief applied and repositioned to promote a comfortable environment. Pt placed on cardiac monitor and transported to admission room. No distress observed.

## 2020-06-23 NOTE — Evaluation (Signed)
Occupational Therapy Evaluation Patient Details Name: Mary Walters MRN: [id number] DOB: [birth year] Today's Date: 06/23/2020    History of Present Illness Pt is a 87 y.o. female presenting to ED on 06/22/20 with known COVID-19 infection (positive test 6/5), AMS and recurrent falls. Patient admitted with COVID PNA, acute encephalopathy. MRI negative for acute abnormality. PMH significant for HTN, HLD, CVA, DMII, vascular dementia.   Clinical Impression   Patient with dementia at baseline with inability to provide home set-up or PLOF. Information obtained from daughter-in-law via phone call. PTA patient was living with family who provides 24hr supervision/assist and was requiring up to +2 assist for all ADLs at baseline. Patient with falls on 5/31 and 6/1 with patient reports a decline in function since then. Patient currently requiring Max A to +2 assist for all bed mobility. Patient also unable to come to full upright position in standing. Patient also limited by deficits listed below including deficits listed below including generalized weakness/debility and would benefit from continued acute OT services in prep for safe d/c home with continued 24hr supervision/assist from family.     Follow Up Recommendations  Home health OT;Supervision/Assistance - 24 hour    Equipment Recommendations  Wheelchair (measurements OT);Wheelchair cushion (measurements OT);Hospital bed    Recommendations for Other Services       Precautions / Restrictions Precautions Precautions: Fall Restrictions Weight Bearing Restrictions: No      Mobility Bed Mobility Overal bed mobility: Needs Assistance Bed Mobility: Rolling;Supine to Sit;Sit to Supine Rolling: Min assist;Max assist   Supine to sit: Max assist Sit to supine: Mod assist;+2 for physical assistance;+2 for safety/equipment   General bed mobility comments: Patient requires Max multimodal cues for all parts of bed mobility. Able to roll R with Min  A but requries Max A and increased time/effort to roll to L. Max A for supine to EOB at BLE. Patient able to elevate trunk. Mod A +2 at trunk and BLE for return to supine.    Transfers Overall transfer level: Needs assistance   Transfers: Lateral/Scoot Transfers          Lateral/Scoot Transfers: Max assist;+2 physical assistance;+2 safety/equipment General transfer comment: Max A +2 for L lateral scoot toward HOB with use of chuck pad.    Balance Overall balance assessment: Needs assistance Sitting-balance support: Bilateral upper extremity supported;Feet supported Sitting balance-Leahy Scale: Poor Sitting balance - Comments: Inconsistent. Requries Min A to Max A with fatigue. Bilateral knee block initially to prevent hips from sliding forward.   Standing balance support: Bilateral upper extremity supported Standing balance-Leahy Scale: Zero Standing balance comment: Unable to extend hips to come fully upright with 2 attempts, bilateral knee block and HHA +2.                           ADL either performed or assessed with clinical judgement   ADL Overall ADL's : Needs assistance/impaired Eating/Feeding: Maximal assistance Eating/Feeding Details (indicate cue type and reason): Max A to bring cup to mouth and take sips. Requires increased time and max multimodal cues. Grooming: Maximal assistance;Wash/dry face Grooming Details (indicate cue type and reason): Max A, Max multimodal cues and hand over hand to bring washcloth to mouth.         Upper Body Dressing : Moderate assistance;Bed level Upper Body Dressing Details (indicate cue type and reason): Mod A to don anterior hospital gown in supine. Requires max multimodal cues for sequencing. Lower Body Dressing: Total assistance;Bed  level Lower Body Dressing Details (indicate cue type and reason): Total A to don footwear in supine.                     Vision Patient Visual Report: Other (comment) (Unable to  state) Vision Assessment?: Vision impaired- to be further tested in functional context Additional Comments: Patient with difficulty locating red button on call bell. Could likely have visual deficits at baseline??     Perception     Praxis      Pertinent Vitals/Pain Pain Assessment: Faces Faces Pain Scale: No hurt Pain Intervention(s): Monitored during session     Hand Dominance  (unknown)   Extremity/Trunk Assessment Upper Extremity Assessment Upper Extremity Assessment: Generalized weakness;RUE deficits/detail;LUE deficits/detail RUE Deficits / Details: Spontaneous movement noted. Generalized weakness. Able to bring washcloth to face and cup to mouth with hand over hand assist. RUE Sensation: WNL RUE Coordination: decreased fine motor;decreased gross motor LUE Deficits / Details: Spontaneous movement noted. Generalized weakness. LUE Sensation: WNL LUE Coordination: decreased fine motor;decreased gross motor   Lower Extremity Assessment Lower Extremity Assessment: Defer to PT evaluation   Cervical / Trunk Assessment Cervical / Trunk Assessment: Kyphotic   Communication Communication Communication: Other (comment) (Patient with dementia)   Cognition Arousal/Alertness: Awake/alert Behavior During Therapy: WFL for tasks assessed/performed Overall Cognitive Status: History of cognitive impairments - at baseline                                 General Comments: Hx of dementia. Pleasantly confused. DIL states that patient could hold casual conversation with family prior to falls x2 last week. Family notes further decline in cognition since falls with inability to recognize family members. Patient with "parrot like" speech this date often repeating therapists. Perseverating on asking this Clinical research associate if I am an Psychologist, clinical".   General Comments  IV out of L wrist upon entry. SpO2 95% on 1L, HR 96bpm, BP 140/69 (prior to session). Titrated to RA. With light activity  at EOB HR in 90's and SpO2 94%. BP 172/85 at conclusion of session.    Exercises     Shoulder Instructions      Home Living Family/patient expects to be discharged to:: Private residence Living Arrangements: Children;Other (Comment) Available Help at Discharge: Family;Available 24 hours/day (DIL and daughter provide 24hr supervision/assist) Type of Home: House Home Access: Other (comment) (Patient lives in converted garage with level entry)     Home Layout: Two level;Able to live on main level with bedroom/bathroom     Bathroom Shower/Tub: Other (comment) (Patient sponge bathes with assist from family.)   Bathroom Toilet: Standard     Home Equipment: Environmental consultant - 2 wheels;Walker - 4 wheels;Bedside commode          Prior Functioning/Environment Level of Independence: Needs assistance  Gait / Transfers Assistance Needed: DIL reports patient able to walk from bed to couch with assistance and use of RW. Limited ambulator at baseline. ADL's / Homemaking Assistance Needed: Assist for all ADLs/IADLs. Intermittently requires assist for self-feeding. Up to +2 assist for bathing/dressing at baseline.            OT Problem List: Decreased strength;Decreased range of motion;Decreased activity tolerance;Impaired vision/perception;Impaired balance (sitting and/or standing);Decreased coordination;Decreased cognition;Decreased safety awareness;Decreased knowledge of use of DME or AE;Impaired UE functional use      OT Treatment/Interventions: Self-care/ADL training;Therapeutic exercise;Energy conservation;DME and/or AE instruction;Therapeutic activities;Patient/family education;Balance training  OT Goals(Current goals can be found in the care plan section) Acute Rehab OT Goals Patient Stated Goal: To return home per daughter-in-law via phone call. OT Goal Formulation: With family Time For Goal Achievement: 07/07/20 Potential to Achieve Goals: Fair ADL Goals Pt Will Perform Eating:  sitting;with mod assist (With multimodal cues for sequencing) Pt Will Perform Grooming: with mod assist;sitting (With multimodal cues for sequencing) Additional ADL Goal #1: Patient will complete sit to stand transfers with use of RW and Mod A in prep for ADLs. Additional ADL Goal #2: Patient will complete bed mobility with Mod A in prep for ADLs. Additional ADL Goal #3: Patient will follow 1-step verbal commands with 75% accuracy in prep for grooming and self-feeding tasks.  OT Frequency: Min 2X/week   Barriers to D/C:            Co-evaluation              AM-PAC OT "6 Clicks" Daily Activity     Outcome Measure Help from another person eating meals?: A Lot Help from another person taking care of personal grooming?: A Lot Help from another person toileting, which includes using toliet, bedpan, or urinal?: Total Help from another person bathing (including washing, rinsing, drying)?: Total Help from another person to put on and taking off regular upper body clothing?: A Lot Help from another person to put on and taking off regular lower body clothing?: Total 6 Click Score: 9   End of Session Equipment Utilized During Treatment: Gait belt;Oxygen (Titrated to RA) Nurse Communication: Mobility status;Other (comment) (Patient titrated to RA. IV out of arm upon entry.)  Activity Tolerance: Patient tolerated treatment well;Patient limited by fatigue Patient left: in chair;with call bell/phone within reach;with bed alarm set  OT Visit Diagnosis: Unsteadiness on feet (R26.81);Other abnormalities of gait and mobility (R26.89);Muscle weakness (generalized) (M62.81);Repeated falls (R29.6);Other symptoms and signs involving cognitive function                Time: 1004-1037 OT Time Calculation (min): 33 min Charges:  OT General Charges $OT Visit: 1 Visit OT Evaluation $OT Eval Moderate Complexity: 1 Mod OT Treatments $Self Care/Home Management : 8-22 mins  Debby Clyne H.  OTR/L Supplemental OT, Department of rehab services [phone]  Tandrea Kommer R H. 06/23/2020, 11:31 AM

## 2020-06-23 NOTE — Progress Notes (Signed)
Patient still missing @12 :40 pm.  --Called MRI @ [phone] as this was patient's last known transfer from Ed and was confirmed that patient is done with MRI and transported back to ED room 12 -- Called ED and asked to speak to RN for room 12 and informed that there is no patient in ED room 12 -- Requested to double check and found patient in room 12 and told them to transfer to her assigned room and was confirmed about transfer soon to 2W 26 -- Received a call back form ED that patient has been located by them and working on her transfer and plan to initiate safety portal zone.  [phone], MD PGY-1, Resident

## 2020-06-23 NOTE — Evaluation (Signed)
Clinical/Bedside Swallow Evaluation Patient Details  Name: Mary Walters MRN: [id number] Date of Birth: 1928/10/21  Today's Date: 06/23/2020 Time: SLP Start Time (ACUTE ONLY): 0901 SLP Stop Time (ACUTE ONLY): 0921 SLP Time Calculation (min) (ACUTE ONLY): 19.55 min  Past Medical History:  Past Medical History:  Diagnosis Date   Diabetes mellitus without complication (HCC)    HLD (hyperlipidemia) 06/02/2017   Hypertension    Stroke (cerebrum) (HCC) 05/14/2017   Urge incontinence 09/05/2017   Vascular dementia of acute onset without behavioral disturbance (HCC) 09/05/2017   Past Surgical History: No past surgical history on file. HPI:  Pt is a 87 y.o. female who presented with falls and altered mental status with known COVID-19 infection and was admitted for COVID pneumonia and acute encephalopathy. MRI brain negative for acute changes. CXR 6/9: Development of left and possible right-sided interstitial/airspace  disease, with differential considerations of multifocal infection  versus asymmetric pulmonary edema. PMH is significant for HTN, HLD, CVA, Type 2 Diabetes, and dementia.   Assessment / Plan / Recommendation Clinical Impression  Pt was seen for bedside swallow evaluation. Pt denied symptoms of oropharyngeal dysphagia prior to admission and these reports were corroborated by her daughter who was contacted via phone. Oral mechanism exam was limited due to pt's difficulty following commands. Oral mucosa was moist and pt presented with limited dentition in poor condition. No s/sx of aspiration were noted with any solids or liquids. Pt demonstrated mild oral holding, but cueing was not required for swallowing. Mastication was prolonged with regular texture solids, but oral clearance was adequate. Pt's daughter reported that the pt typically consumes soft solids with some regular textures and that she needs foods to be chopped. Pt's diet will be modified to dysphagia 3 solids and thin liquids  which is closer to her baseline. Per pt's current nurse, Sujata, pt's night-shift nurse reported pocketing with pills. Considering this report, SLP will follow briefly to ensure diet tolerance. SLP Visit Diagnosis: Dysphagia, unspecified (R13.10)    Aspiration Risk  Mild aspiration risk    Diet Recommendation Thin liquid;Dysphagia 3 (Mech soft)   Liquid Administration via: Cup;Straw Medication Administration: Whole meds with puree Supervision: Staff to assist with self feeding Compensations: Slow rate;Small sips/bites Postural Changes: Seated upright at 90 degrees    Other  Recommendations Oral Care Recommendations: Oral care BID   Follow up Recommendations None      Frequency and Duration min 1 x/week  1 week       Prognosis        Swallow Study   General Date of Onset: 06/22/20 HPI: Pt is a 87 y.o. female who presented with falls and altered mental status with known COVID-19 infection and was admitted for COVID pneumonia and acute encephalopathy. MRI brain negative for acute changes. CXR 6/9: Development of left and possible right-sided interstitial/airspace  disease, with differential considerations of multifocal infection  versus asymmetric pulmonary edema. PMH is significant for HTN, HLD, CVA, Type 2 Diabetes, and dementia. Type of Study: Bedside Swallow Evaluation Previous Swallow Assessment: none Diet Prior to this Study: Regular;Thin liquids Temperature Spikes Noted: No Respiratory Status: Nasal cannula History of Recent Intubation: No Behavior/Cognition: Alert;Cooperative;Pleasant mood Oral Cavity Assessment: Within Functional Limits Oral Care Completed by SLP: No Oral Cavity - Dentition: Missing dentition;Poor condition Vision: Functional for self-feeding Self-Feeding Abilities: Needs assist Patient Positioning: Upright in bed;Postural control adequate for testing Baseline Vocal Quality: Normal Volitional Cough: Cognitively unable to elicit Volitional Swallow:  Unable to elicit  Oral/Motor/Sensory Function Overall Oral Motor/Sensory Function:  (unable to fully assess)   Ice Chips Ice chips: Within functional limits Presentation: Spoon   Thin Liquid Thin Liquid: Within functional limits Presentation: Straw    Nectar Thick Nectar Thick Liquid: Not tested   Honey Thick Honey Thick Liquid: Not tested   Puree Puree: Impaired Presentation: Spoon Oral Phase Impairments: Poor awareness of bolus Oral Phase Functional Implications: Oral holding (mild)   Solid     Solid: Impaired Oral Phase Impairments: Impaired mastication Oral Phase Functional Implications: Oral residue     Mary Walters Clock, MS, CCC-SLP Acute Rehabilitation Services Office number [phone] Pager [phone]  Scheryl Marten 06/23/2020,9:35 AM

## 2020-06-23 NOTE — Plan of Care (Signed)
  Problem: Education: Goal: Knowledge of General Education information will improve Description: Including pain rating scale, medication(s)/side effects and non-pharmacologic comfort measures Outcome: Progressing   Problem: Health Behavior/Discharge Planning: Goal: Ability to manage health-related needs will improve Outcome: Progressing   Problem: Clinical Measurements: Goal: Ability to maintain clinical measurements within normal limits will improve Outcome: Progressing Goal: Will remain free from infection Outcome: Progressing Goal: Diagnostic test results will improve Outcome: Progressing Goal: Respiratory complications will improve Outcome: Progressing Goal: Cardiovascular complication will be avoided Outcome: Progressing   Problem: Activity: Goal: Risk for activity intolerance will decrease Outcome: Progressing   Problem: Nutrition: Goal: Adequate nutrition will be maintained Outcome: Progressing   Problem: Coping: Goal: Level of anxiety will decrease Outcome: Progressing   Problem: Elimination: Goal: Will not experience complications related to bowel motility Outcome: Progressing Goal: Will not experience complications related to urinary retention Outcome: Progressing   Problem: Pain Managment: Goal: General experience of comfort will improve Outcome: Progressing   Problem: Safety: Goal: Ability to remain free from injury will improve Outcome: Progressing   Problem: Skin Integrity: Goal: Risk for impaired skin integrity will decrease Outcome: Progressing   Problem: Activity: Goal: Ability to tolerate increased activity will improve Outcome: Progressing   Problem: Clinical Measurements: Goal: Ability to maintain a body temperature in the normal range will improve Outcome: Progressing   Problem: Respiratory: Goal: Ability to maintain adequate ventilation will improve Outcome: Progressing Goal: Ability to maintain a clear airway will improve Outcome:  Progressing   Problem: Education: Goal: Knowledge of risk factors and measures for prevention of condition will improve Outcome: Progressing   Problem: Coping: Goal: Psychosocial and spiritual needs will be supported Outcome: Progressing   Problem: Respiratory: Goal: Will maintain a patent airway Outcome: Progressing Goal: Complications related to the disease process, condition or treatment will be avoided or minimized Outcome: Progressing   

## 2020-06-23 NOTE — Evaluation (Signed)
Physical Therapy Evaluation Patient Details Name: Mary Walters MRN: [id number] DOB: [birth year] Today's Date: 06/23/2020   History of Present Illness  Pt is a 87 y.o. female presenting to ED on 06/22/20 with known COVID-19 infection (positive test 6/5), AMS and recurrent falls. Patient admitted with COVID PNA, acute encephalopathy. MRI negative for acute abnormality. PMH significant for HTN, HLD, CVA, DMII, vascular dementia.   Clinical Impression  Pt presents with an overall decrease in functional mobility secondary to above. Pt with h/o dementia and poor historian; OT spoke with family who verified home set-up and PLOF. PTA, pt limited household ambulator with RW and assist, family assists with all ADL/iADLs; pt with falls 5/31 and 6/1, pt with decline in function since then. Today, pt required up to maxA+2 for limited mobility; pt with poor sitting balance and unable to achieve fully upright standing. Pt pleasantly confused, inconsistent with command following. Family plans to have pt return home with continued 24/7 assist; recommend follow-up with HHPT services to maximize functional mobility and decrease caregiver burden. Will follow acutely to address established goals.  SpO2 94% on RA HR 90s Post-mobility BP 172/85    Follow Up Recommendations Home health PT;Supervision/Assistance - 24 hour    Equipment Recommendations  Wheelchair (measurements PT);Wheelchair cushion (measurements PT);Hospital bed    Recommendations for Other Services       Precautions / Restrictions Precautions Precautions: Fall Restrictions Weight Bearing Restrictions: No      Mobility  Bed Mobility Overal bed mobility: Needs Assistance Bed Mobility: Rolling;Supine to Sit;Sit to Supine Rolling: Min assist;Max assist   Supine to sit: Max assist Sit to supine: Mod assist;+2 for physical assistance;+2 for safety/equipment   General bed mobility comments: Patient requires Max multimodal cues for all  parts of bed mobility. Able to roll R with Min A but requries Max A and increased time/effort to roll to L. Max A for supine to EOB at BLE. Patient able to elevate trunk. Mod A +2 at trunk and BLE for return to supine.    Transfers Overall transfer level: Needs assistance Equipment used: 1 person hand held assist;None Transfers: Sit to/from Stand;Lateral/Scoot Transfers Sit to Stand: Max assist;+2 physical assistance;+2 safety/equipment        Lateral/Scoot Transfers: Max assist;+2 physical assistance;+2 safety/equipment General transfer comment: Initial standing trial with maxA+1 and HHA, L knee blocked, pt able to offload buttocks but unable to acehive fully upright; additional trial with maxA+2, decreased ability to offload buttocks suspect related to fatigue; lateral scoot towards HOB 2x with use of bed pad and maxA+2  Ambulation/Gait             General Gait Details: Unable this session  Stairs            Wheelchair Mobility    Modified Rankin (Stroke Patients Only)       Balance Overall balance assessment: Needs assistance Sitting-balance support: Bilateral upper extremity supported;Feet supported Sitting balance-Leahy Scale: Poor Sitting balance - Comments: Inconsistent seated balance flucutating from min guard (brief bouts) to maxA with increase fatigue; inconsistently pushing into extension requiring knee block at times to prevent sliding from EOB   Standing balance support: Bilateral upper extremity supported Standing balance-Leahy Scale: Zero Standing balance comment: Unable to extend hips to come fully upright with 2 attempts, bilateral knee block and HHA                             Pertinent Vitals/Pain Pain  Assessment: Faces Faces Pain Scale: No hurt Pain Intervention(s): Monitored during session    Home Living Family/patient expects to be discharged to:: Private residence Living Arrangements: Children Available Help at Discharge:  Family;Available 24 hours/day Type of Home: House Home Access: Level entry     Home Layout: Two level;Able to live on main level with bedroom/bathroom Home Equipment: Dan Humphreys - 2 wheels;Walker - 4 wheels;Bedside commode Additional Comments: Pt lives in converted garage with level entry; daughter and daughter-in-law provide 24/7 assist    Prior Function Level of Independence: Needs assistance   Gait / Transfers Assistance Needed: DIL reports "on a good day" pt able to walk from bed to couch with RW and assistance; limited household ambulator at baseline  ADL's / Homemaking Assistance Needed: Assist for all ADLs/IADLs. Intermittently requires assist for self-feeding. Up to +2 assist for bathing/dressing at baseline; sponge bathes at sink.        Hand Dominance   Dominant Hand:  (unknown)    Extremity/Trunk Assessment   Upper Extremity Assessment Upper Extremity Assessment: Generalized weakness;RUE deficits/detail;LUE deficits/detail;Difficult to assess due to impaired cognition RUE Deficits / Details: Spontaneous movement noted. Generalized weakness. Able to bring washcloth to face and cup to mouth with hand over hand assist. RUE Sensation: WNL RUE Coordination: decreased fine motor;decreased gross motor LUE Deficits / Details: Spontaneous movement noted. Generalized weakness. LUE Sensation: WNL LUE Coordination: decreased fine motor;decreased gross motor    Lower Extremity Assessment Lower Extremity Assessment: Generalized weakness;Difficult to assess due to impaired cognition (Hip and knee strength functionally at least 3/5 with good ROM observed during session; pt not consistently following commands for formal testing)    Cervical / Trunk Assessment Cervical / Trunk Assessment: Kyphotic  Communication   Communication: HOH;Other (comment) (dementia)  Cognition Arousal/Alertness: Awake/alert Behavior During Therapy: WFL for tasks assessed/performed Overall Cognitive Status:  History of cognitive impairments - at baseline                                 General Comments: Hx of dementia. Pleasantly confused. DIL states that patient could hold casual conversation with family prior to falls x2 last week. Family notes further decline in cognition since falls with inability to recognize family members. Patient with "parrot like" speech this date often repeating therapists words and actions. Perseverating on asking this Clinical research associate if I am an Psychologist, clinical".      General Comments General comments (skin integrity, edema, etc.): Noted pt's L wrist PIV out upon entry (RN notified). SpO2 95% on 1L, HR 96, BP 140/69 (prior to session). Session performed on RA with SpO2 maintaining 94% with reliable pleth. Post-mobility BP 172/85. Pt pleasantly confused throughout session. OT spoke with pt's daughter-in-law to confirm d/c recommendations, assist available, DME needs and home set-up    Exercises     Assessment/Plan    PT Assessment Patient needs continued PT services  PT Problem List Decreased strength;Decreased activity tolerance;Decreased balance;Decreased mobility;Decreased cognition;Decreased knowledge of use of DME;Decreased safety awareness;Cardiopulmonary status limiting activity       PT Treatment Interventions DME instruction;Gait training;Functional mobility training;Therapeutic activities;Therapeutic exercise;Balance training;Cognitive remediation;Patient/family education;Wheelchair mobility training    PT Goals (Current goals can be found in the Care Plan section)  Acute Rehab PT Goals Patient Stated Goal: To return home per daughter-in-law via phone call. PT Goal Formulation: With family Time For Goal Achievement: 07/07/20 Potential to Achieve Goals: Good    Frequency Min 3X/week  Barriers to discharge        Co-evaluation PT/OT/SLP Co-Evaluation/Treatment: Yes Reason for Co-Treatment: Necessary to address cognition/behavior during  functional activity;For patient/therapist safety;To address functional/ADL transfers PT goals addressed during session: Mobility/safety with mobility;Balance         AM-PAC PT "6 Clicks" Mobility  Outcome Measure Help needed turning from your back to your side while in a flat bed without using bedrails?: A Lot Help needed moving from lying on your back to sitting on the side of a flat bed without using bedrails?: A Lot Help needed moving to and from a bed to a chair (including a wheelchair)?: Total Help needed standing up from a chair using your arms (e.g., wheelchair or bedside chair)?: Total Help needed to walk in hospital room?: Total Help needed climbing 3-5 steps with a railing? : Total 6 Click Score: 8    End of Session Equipment Utilized During Treatment: Gait belt Activity Tolerance: Patient tolerated treatment well;Patient limited by fatigue Patient left: in bed;with call bell/phone within reach;with bed alarm set (bed in modified chair position) Nurse Communication: Mobility status PT Visit Diagnosis: Other abnormalities of gait and mobility (R26.89);Muscle weakness (generalized) (M62.81)    Time: 0814-4818 PT Time Calculation (min) (ACUTE ONLY): 30 min   Charges:   PT Evaluation $PT Eval Moderate Complexity: 1 Mod     Ina Homes, PT, DPT Acute Rehabilitation Services  Pager [phone] Office [phone]  Malachy Chamber 06/23/2020, 11:49 AM

## 2020-06-23 NOTE — Progress Notes (Addendum)
Waiting for patient to be transferred to floor to evaluate.   It has been noted that she has been OTFC since 1902.  Called 2 [facility], patient had not arrived from ED yet.  Called ED and unable to find patient.  Patient has not received any medications yet.  Still trying to track down patient.  Charge nurse looking into this now.  Asked if she could page me once patient located.  Dana Allan, MD Family Medicine Residency   [phone]

## 2020-06-23 NOTE — ED Notes (Addendum)
Received call from family medicine asking where pt was located and told pt was not on the floor, called 2[facility] and told pt was not there, called MRI since last noted states report was called to the floor and pt went to MRI. MRI states that pt came to MRI around shift change and then returned to the ED, asked pt where they were in the ED and told they came back where they were located before. Pt was brought back to RM 12 by transporter Ladene Artist and did not tell any ED staff member, not on monitor and room door was closed. Once pt was located in the ED, pt was transported to the floor

## 2020-06-23 NOTE — ED Notes (Signed)
Paged DR Rachael Darby for Charge RN Shanda Bumps FM

## 2020-06-23 NOTE — Progress Notes (Signed)
FPTS Interim Progress Note  Called daughter in law, [name], to update her of events overnight.  Discussed the error in transportation from ED to floor.  I explained that I went to evaluate once I had located her and my assessment patient was pleasant and vital signs stable. Ms [name] asked if she could visit and I was not sure about the visiting policy.  Advised to call floor to get visiting policy.  Answered all questions.  Will have day team update family.  Dana Allan, MD 06/23/2020, 8:11 AM PGY-2, [facility] Family Medicine Service pager [phone]

## 2020-06-24 LAB — CBC WITH DIFFERENTIAL/PLATELET
Abs Immature Granulocytes: 0.12 10*3/uL — ABNORMAL HIGH (ref 0.00–0.07)
Basophils Absolute: 0 10*3/uL (ref 0.0–0.1)
Basophils Relative: 0 %
Eosinophils Absolute: 0 10*3/uL (ref 0.0–0.5)
Eosinophils Relative: 0 %
HCT: 36 % (ref 36.0–46.0)
Hemoglobin: 11.6 g/dL — ABNORMAL LOW (ref 12.0–15.0)
Immature Granulocytes: 2 %
Lymphocytes Relative: 12 %
Lymphs Abs: 0.7 10*3/uL (ref 0.7–4.0)
MCH: 26.4 pg (ref 26.0–34.0)
MCHC: 32.2 g/dL (ref 30.0–36.0)
MCV: 81.8 fL (ref 80.0–100.0)
Monocytes Absolute: 0.4 10*3/uL (ref 0.1–1.0)
Monocytes Relative: 6 %
Neutro Abs: 4.7 10*3/uL (ref 1.7–7.7)
Neutrophils Relative %: 80 %
Platelets: 210 10*3/uL (ref 150–400)
RBC: 4.4 MIL/uL (ref 3.87–5.11)
RDW: 14.7 % (ref 11.5–15.5)
WBC: 5.9 10*3/uL (ref 4.0–10.5)
nRBC: 0 % (ref 0.0–0.2)

## 2020-06-24 LAB — GLUCOSE, CAPILLARY
Glucose-Capillary: 198 mg/dL — ABNORMAL HIGH (ref 70–99)
Glucose-Capillary: 274 mg/dL — ABNORMAL HIGH (ref 70–99)
Glucose-Capillary: 200 mg/dL — ABNORMAL HIGH (ref 70–99)
Glucose-Capillary: 193 mg/dL — ABNORMAL HIGH (ref 70–99)

## 2020-06-24 MED ORDER — AMLODIPINE BESYLATE 5 MG PO TABS
5.0000 mg | ORAL_TABLET | Freq: Every day | ORAL | Status: DC
  Administered 2020-06-24 – 2020-06-27 (×4): 5 mg via ORAL
  Filled 2020-06-24 (×4): qty 1

## 2020-06-24 NOTE — Hospital Course (Addendum)
Mary Walters is a 87 y.o. female that presented with falls and altered mental status with COVID -19, found to have pneumonia and concern for acute encephalopathy related to infection. PMH significant for HTN, HLD, CVA, Type 2 DM, and dementia  COVID-19 Pneumonia Patient presented with fall/mental status in the setting of COVID-19 positive.  She was found to have pneumonia and was noted to have altered mental status, likely related to acute infection, see below.  Labs were significant for an ferritin of 436, LDH 260, BNP 163 lactic acid 1.23.93, fibrinogen 752.  Patient started on remdesivir and Decadron per protocol. (4-day course). Patient was placed on 1 L O2 due to initial desaturations, but was able to be quickly weaned off ventilator and maintained appropriate saturations on room air.    Acute encephalopathy in the setting of infection-Improved Vascular dementia  history of CVA Patient found to have COVID-19 pneumonia, due to encephalopathy and concern for possible other infectious sources, blood cultures were obtained which showed no growth.  MRI was completed without any acute changes.  Mental status improved without further intervention beyond treating COVID-pneumonia.  Hypertension Blood pressure was consistently elevated to the 140s-180 systolic.  Patient was continued on home medications, though home amlodipine was increased to 5 mg daily.   All other chronic conditions were stable.

## 2020-06-24 NOTE — Progress Notes (Signed)
Spoke with patient's daughter Mary Walters about current patient status. Patient is doing very well and has not required any oxygen and has not had any episodes of diarrhea since being here.  She has been a very pleasant mood the entire time and interacting well with staff. Daughter was concerned that patient's mental status has changed, she has not recognized the family members in the several days prior to admission and was wondering if this could be related to patient's vascular dementia or another cause.  Very hard to rule out entirely but could be related to worsening vascular dementia but patient was also acutely ill, which can contribute to this as well.  Family is all very close and very supportive and made to provide any needs.  Discussed with Mary Walters that the wheelchair and hospital bed are supposed to be delivered on Monday.  Mary Walters plans to come to the hospital later today and is aware that one of our physicians is always on call in the hospital and would be glad to talk to her if available.   Avriel Kandel, DO

## 2020-06-24 NOTE — Care Management (Signed)
Per [employer], hospital bed and WC will be delivered Monday

## 2020-06-24 NOTE — Progress Notes (Signed)
FPTS Interim Progress Note  Patient sleeping and resting comfortably.  Rounded with primary RN.  Now on room air.  No concerns voiced.  No orders required.  Appreciated nightly round.  Today's Vitals   06/23/20 1200 06/23/20 1606 06/23/20 2030 06/24/20 0005  BP: (!) 159/75 (!) 163/84 (!) 174/96 (!) 178/90  Pulse: 73 72 71 75  Resp: 18 16 18 18   Temp: 98 F (36.7 C) 98 F (36.7 C) 98 F (36.7 C) 98.3 F (36.8 C)  TempSrc: Oral Oral Oral Oral  SpO2: 92% 95% 98% 96%  Weight:      Height:      PainSc:   0-No pain    Dispo: Med , MD 06/24/2020, 4:06 AM PGY-2, [facility] Family Medicine Service pager [phone]

## 2020-06-24 NOTE — Plan of Care (Signed)
  Problem: Education: Goal: Knowledge of General Education information will improve Description: Including pain rating scale, medication(s)/side effects and non-pharmacologic comfort measures Outcome: Progressing   Problem: Health Behavior/Discharge Planning: Goal: Ability to manage health-related needs will improve Outcome: Progressing   Problem: Clinical Measurements: Goal: Ability to maintain clinical measurements within normal limits will improve Outcome: Progressing Goal: Will remain free from infection Outcome: Progressing Goal: Diagnostic test results will improve Outcome: Progressing Goal: Respiratory complications will improve Outcome: Progressing Goal: Cardiovascular complication will be avoided Outcome: Progressing   Problem: Activity: Goal: Risk for activity intolerance will decrease Outcome: Progressing   Problem: Nutrition: Goal: Adequate nutrition will be maintained Outcome: Progressing   Problem: Coping: Goal: Level of anxiety will decrease Outcome: Progressing   Problem: Elimination: Goal: Will not experience complications related to bowel motility Outcome: Progressing Goal: Will not experience complications related to urinary retention Outcome: Progressing   Problem: Pain Managment: Goal: General experience of comfort will improve Outcome: Progressing   Problem: Safety: Goal: Ability to remain free from injury will improve Outcome: Progressing   Problem: Skin Integrity: Goal: Risk for impaired skin integrity will decrease Outcome: Progressing   Problem: Activity: Goal: Ability to tolerate increased activity will improve Outcome: Progressing   Problem: Clinical Measurements: Goal: Ability to maintain a body temperature in the normal range will improve Outcome: Progressing   Problem: Respiratory: Goal: Ability to maintain adequate ventilation will improve Outcome: Progressing Goal: Ability to maintain a clear airway will improve Outcome:  Progressing   Problem: Education: Goal: Knowledge of risk factors and measures for prevention of condition will improve Outcome: Progressing   Problem: Coping: Goal: Psychosocial and spiritual needs will be supported Outcome: Progressing   Problem: Respiratory: Goal: Will maintain a patent airway Outcome: Progressing Goal: Complications related to the disease process, condition or treatment will be avoided or minimized Outcome: Progressing   

## 2020-06-24 NOTE — Progress Notes (Addendum)
Family Medicine Teaching Service Daily Progress Note Intern Pager: [phone]  Patient name: Mary Walters Medical record number: [id number] Date of birth: 1928-02-28 Age: 87 y.o. Gender: female  Primary Care Provider: Melene Plan, MD Consultants: None Code Status: DNR  Pt Overview and Major Events to Date:  Mary Walters is a 87 y.o. female that presented with falls and altered mental status with COVID -19, found to have pneumonia and concern for acute encephalopathy related to infection. PMH significant for HTN, HLD, CVA, Type 2 DM, and dementia  Assessment and Plan: Mary Walters is a 87 y.o. female that presented with falls and altered mental status with COVID -19, found to have pneumonia and concern for acute encephalopathy related to infection. PMH significant for HTN, HLD, CVA, Type 2 DM, and dementia   COVID-19 Pneumonia Patient satting well on room air, and has been on room air since around noon on 6/10. Previously thought that patient had received Remdesivir in the ED, but did not occur so prior note is incorrect and she has only had 2 doses. - Remdesivir per pharmacy (day 2/5) - Decadron 6mg  (day 3/10) - Vitals per floor routine - Continuous pulse ox with goal saturations >88%, wean oxygen as able - Encourage incentive spirometry   Acute encephalopathy, improved Vascular dementia  H/o CVA Blood cultures with NGTD at 2 days, mental status likely secondary to COVID infection. Patient with increased needs for assistance at home and working with [facility] to get home health and supplies. - Fall and delirium precations - PT/OT/SLP following - Diet per SLP - Continue home ASA 81mg  and statin   HTN BP 140-186/69-96, most recently 176/85, will have manual repeat done. Patient did received her home medications nightly as prescribed. we can consider increasing her amlodipine dose to 5mg  if truly elevated. - Continue home amlodipine 2.5mg  - Irbesartan 75mg  nightly  (Candesartan not on formulary) - Continue to monitor BP   Type 2 DM [facility], patient received 7U Novolog and is currently on steroids, which is likely affecting her glucose. - sSSI - Monitor CBGs QAC and QHS  Diarrhea, resolved  FEN/GI: soft diet PPx: Lovenox   Status is: Inpatient  Remains inpatient appropriate because:Inpatient level of care appropriate due to severity of illness  Dispo: The patient is from: Home              Anticipated d/c is to: Home              Patient currently is not medically stable to d/c.   Difficult to place patient No    Subjective:  Patient reports that she "feels wonderful" and is not having any difficulties with breathing or pain anywhere including her chest, legs, or abdomen  Objective: Temp:  [97.8 F (36.6 C)-98.3 F (36.8 C)] 97.8 F (36.6 C) (06/11 0438) Pulse Rate:  [71-98] 75 (06/11 0005) Resp:  [16-18] 18 (06/11 0005) BP: (140-186)/(69-96) 186/82 (06/11 0438) SpO2:  [92 %-100 %] 96 % (06/11 0005) Physical Exam: General: NAD, supine in bed, elderly and very pleasant Cardiovascular: RRR, difficult to auscultate for murmur given negative pressure room and stethoscope quality Respiratory: breathing comfortably on room air, no increased WOB, upper lobes clear, difficult to auscultate lower given patient was not taking deep breaths as instructed but airflow was noted Abdomen: soft, non-tender, non-distended Extremities: BLE non-tender, non-erythematous without signs of DVT  Laboratory: Recent Labs  Lab 06/22/20 1440 06/23/20 0158 06/24/20 0150  WBC 7.0 7.5 5.9  HGB 12.0  12.0 11.6*  HCT 39.0 38.4 36.0  PLT 232 229 210   Recent Labs  Lab 06/18/20 1703 06/22/20 1440 06/23/20 0158  NA 138 142 142  K 3.6 3.6 3.5  CL 106 105 107  CO2 24 25 22   BUN 10 13 13   CREATININE 0.78 0.80 0.80  CALCIUM 8.8* 8.9 9.1  PROT 6.7 7.4  --   BILITOT 0.7 0.7  --   ALKPHOS 71 69  --   ALT 17 22  --   AST 32 42*  --   GLUCOSE 176*  107* 114*     Imaging/Diagnostic Tests: No results found.   , DO 06/24/2020, 6:10 AM PGY-1, [facility] Family Medicine FPTS Intern pager: [phone], text pages welcome

## 2020-06-25 LAB — CBC WITH DIFFERENTIAL/PLATELET
Abs Immature Granulocytes: 0.17 10*3/uL — ABNORMAL HIGH (ref 0.00–0.07)
Basophils Absolute: 0 10*3/uL (ref 0.0–0.1)
Basophils Relative: 0 %
Eosinophils Absolute: 0 10*3/uL (ref 0.0–0.5)
Eosinophils Relative: 0 %
HCT: 38.1 % (ref 36.0–46.0)
Hemoglobin: 12.4 g/dL (ref 12.0–15.0)
Immature Granulocytes: 2 %
Lymphocytes Relative: 10 %
Lymphs Abs: 0.8 10*3/uL (ref 0.7–4.0)
MCH: 26.6 pg (ref 26.0–34.0)
MCHC: 32.5 g/dL (ref 30.0–36.0)
MCV: 81.6 fL (ref 80.0–100.0)
Monocytes Absolute: 0.4 10*3/uL (ref 0.1–1.0)
Monocytes Relative: 5 %
Neutro Abs: 6.5 10*3/uL (ref 1.7–7.7)
Neutrophils Relative %: 83 %
Platelets: 265 10*3/uL (ref 150–400)
RBC: 4.67 MIL/uL (ref 3.87–5.11)
RDW: 14.6 % (ref 11.5–15.5)
WBC: 7.6 10*3/uL (ref 4.0–10.5)
nRBC: 0 % (ref 0.0–0.2)

## 2020-06-25 LAB — GLUCOSE, CAPILLARY
Glucose-Capillary: 238 mg/dL — ABNORMAL HIGH (ref 70–99)
Glucose-Capillary: 274 mg/dL — ABNORMAL HIGH (ref 70–99)
Glucose-Capillary: 224 mg/dL — ABNORMAL HIGH (ref 70–99)
Glucose-Capillary: 215 mg/dL — ABNORMAL HIGH (ref 70–99)

## 2020-06-25 NOTE — Progress Notes (Signed)
I received a personal message from her granddaughter that Mary Walters is in the hospital and wants to talk to Mary Walters provider. I called and spoke with Mary Walters.  She is upset that Mary Walters meal was delivered to her room and no one fed her for hours. Now her meal is cold. Mary Walters had not self-fed since she had COVID-19.  As discussed, I will have the resident come down to talk with her and place an order for nursing to feed the patient for every meal. I apologized for the miscommunication and she was appreciative of my call.  I called and discussed the situation with the on-call residents, Drs. Maness and Autry-Lott. They will go down to talk with Mary Walters.

## 2020-06-25 NOTE — Progress Notes (Signed)
Family Medicine Teaching Service Daily Progress Note Intern Pager: [phone]  Patient name: Mary Walters Medical record number: [id number] Date of birth: May 29, 1928 Age: 87 y.o. Gender: female  Primary Care Provider: Melene Plan, MD Consultants: None Code Status: DNR  Pt Overview and Major Events to Date:  06/22/20: admitted for COVID and AMS, started decadron, remdesivir 06/23/20: decreased from [facility] to RA  Assessment and Plan: Mary Walters is a 86 y.o. female that presented with falls and altered mental status with COVID -19, found to have pneumonia and concern for acute encephalopathy related to infection. PMH significant for HTN, HLD, CVA, Type 2 DM, and dementia   COVID-19 Pneumonia Patient satting well on room air, and has been on room air since around noon on 6/10.  - Remdesivir per pharmacy (day 3/5) - Decadron 6mg  (day 4/10) - Vitals per floor routine - Continuous pulse ox with goal saturations >88%, wean oxygen as able - Encourage incentive spirometry   Acute encephalopathy, improved Vascular dementia  H/o CVA Blood cultures with NGTD at 2 days, mental status likely secondary to COVID infection. Presently alert and oriented to self only, pleasant and conversant. Patient with increased needs for assistance at home and working with [facility] to get home health and supplies.  - Fall and delirium precations - PT/OT/SLP following - Diet per SLP - Continue home ASA 81mg  and statin   HTN BP 140-200/76-90, most recently 182/78. Patient denies symptoms. Patient received her home medications nightly as prescribed. Amlodipine increased on 6/11 to 5 mg. Will increase irbesartan to 150 mg qhs if BP continues to be significantly elevated today. - Continue home amlodipine 5mg  - Irbesartan 75mg  nightly (Candesartan not on formulary) - Continue to monitor BP   Type 2 DM CBGs 154-274, Most recently BG 193. Patient received 9U Novolog and is currently on steroids, which is cause for  hyperglycemia. - sSSI - Monitor CBGs QAC and QHS  Diarrhea, resolved  FEN/GI: soft diet PPx: Lovenox   Status is: Inpatient  Remains inpatient appropriate because:Inpatient level of care appropriate due to severity of illness  Dispo: The patient is from: Home              Anticipated d/c is to: Home              Patient currently is not medically stable to d/c.   Difficult to place patient No    Subjective:  Mary Walters is alert and oriented to self only, pleasantly conversant, says she feels quite well, better than she did.   Objective: Temp:  [97.8 F (36.6 C)-98.2 F (36.8 C)] 98.2 F (36.8 C) (06/11 2110) Pulse Rate:  [73-86] 73 (06/11 2110) Resp:  [18] 18 (06/11 2110) BP: (160-200)/(78-86) 200/78 (06/11 2110) SpO2:  [99 %] 99 % (06/11 2110) Physical Exam: General: elderly AAW, NAD, supine in bed, very pleasant Cardiovascular: RRR, difficult to auscultate for murmur given negative pressure room and stethoscope quality Respiratory: breathing comfortably on room air, no increased WOB, good air flow in all fields  Abdomen: soft, non-tender, non-distended Extremities: no edema  Laboratory: Recent Labs  Lab 06/22/20 1440 06/23/20 0158 06/24/20 0150  WBC 7.0 7.5 5.9  HGB 12.0 12.0 11.6*  HCT 39.0 38.4 36.0  PLT 232 229 210    Recent Labs  Lab 06/18/20 1703 06/22/20 1440 06/23/20 0158  NA 138 142 142  K 3.6 3.6 3.5  CL 106 105 107  CO2 24 25 22   BUN 10 13 13  CREATININE 0.78 0.80 0.80  CALCIUM 8.8* 8.9 9.1  PROT 6.7 7.4  --   BILITOT 0.7 0.7  --   ALKPHOS 71 69  --   ALT 17 22  --   AST 32 42*  --   GLUCOSE 176* 107* 114*      Imaging/Diagnostic Tests: No results found.   Shirlean Mylar, MD 06/25/2020, 1:37 AM PGY-2, [place] Family Medicine FPTS Intern pager: [phone], text pages welcome

## 2020-06-25 NOTE — Progress Notes (Signed)
Placed order for nurse to help with feeding patient given difficulties.  Spoke with nurse on phone and made aware.  Unable to get in touch with patient's daughter on cell phone orp hone in patient's room.      Jovita Kussmaul, MD 06/25/2020, 7:54 PM PGY-1, [facility] Family Medicine Service pager [phone]

## 2020-06-26 LAB — GLUCOSE, CAPILLARY
Glucose-Capillary: 324 mg/dL — ABNORMAL HIGH (ref 70–99)
Glucose-Capillary: 317 mg/dL — ABNORMAL HIGH (ref 70–99)
Glucose-Capillary: 164 mg/dL — ABNORMAL HIGH (ref 70–99)
Glucose-Capillary: 164 mg/dL — ABNORMAL HIGH (ref 70–99)

## 2020-06-26 LAB — CBC WITH DIFFERENTIAL/PLATELET
Abs Immature Granulocytes: 0.15 10*3/uL — ABNORMAL HIGH (ref 0.00–0.07)
Basophils Absolute: 0 10*3/uL (ref 0.0–0.1)
Basophils Relative: 0 %
Eosinophils Absolute: 0 10*3/uL (ref 0.0–0.5)
Eosinophils Relative: 0 %
HCT: 40.2 % (ref 36.0–46.0)
Hemoglobin: 13 g/dL (ref 12.0–15.0)
Immature Granulocytes: 2 %
Lymphocytes Relative: 10 %
Lymphs Abs: 0.7 10*3/uL (ref 0.7–4.0)
MCH: 26.9 pg (ref 26.0–34.0)
MCHC: 32.3 g/dL (ref 30.0–36.0)
MCV: 83.1 fL (ref 80.0–100.0)
Monocytes Absolute: 0.3 10*3/uL (ref 0.1–1.0)
Monocytes Relative: 5 %
Neutro Abs: 6 10*3/uL (ref 1.7–7.7)
Neutrophils Relative %: 83 %
Platelets: 306 10*3/uL (ref 150–400)
RBC: 4.84 MIL/uL (ref 3.87–5.11)
RDW: 14.6 % (ref 11.5–15.5)
WBC: 7.3 10*3/uL (ref 4.0–10.5)
nRBC: 0 % (ref 0.0–0.2)

## 2020-06-26 MED ORDER — DEXAMETHASONE 4 MG PO TABS
6.0000 mg | ORAL_TABLET | ORAL | Status: DC
  Administered 2020-06-26 – 2020-06-27 (×2): 6 mg via ORAL
  Filled 2020-06-26 (×2): qty 2

## 2020-06-26 NOTE — Discharge Summary (Addendum)
[facility] Discharge Summary  Patient name: Mary Walters Medical record number: [id number] Date of birth: [birth year] Age: 87 y.o. Gender: female Date of Admission: 06/22/2020  Date of Discharge: 06/27/2020 Admitting Physician: Lyndee Hensen, DO  Primary Care Provider: Wilber Oliphant, MD Consultants: None  Indication for Hospitalization: COVID-19 pneumonia and acute encephalopathy  Discharge Diagnoses/Problem List:  COVID-19 pneumonia Acute encephalopathy in the setting of infection-improved Hypertension  Disposition: Home  Discharge Condition: Stable  Discharge Exam:  General: Pleasant elderly female sitting comfortably in bed, NAD Cardiovascular: Regular rate and rhythm, negative pressure room Respiratory: Breathing comfortably on room air Abdomen: Soft, nontender, non-distended, BS+ Extremities: No edema appreciated  Brief Hospital Course:  Mary Walters is a 87 y.o. female that presented with falls and altered mental status with COVID -19, found to have pneumonia and concern for acute encephalopathy related to infection. PMH significant for HTN, HLD, CVA, Type 2 DM, and dementia  COVID-19 Pneumonia Patient presented with fall/mental status in the setting of COVID-19 positive.  She was found to have pneumonia and was noted to have altered mental status, likely related to acute infection, see below.  Labs were significant for an ferritin of 436, LDH 260, BNP 163 lactic acid 1.23.93, fibrinogen 752.  Patient started on remdesivir and Decadron per protocol. (4-day course). Patient was placed on 1 L O2 due to initial desaturations, but was able to be quickly weaned off ventilator and maintained appropriate saturations on room air.    Acute encephalopathy in the setting of infection-Improved Vascular dementia  history of CVA Patient found to have COVID-19 pneumonia, due to encephalopathy and concern for possible other infectious sources, blood  cultures were obtained which showed no growth.  MRI was completed without any acute changes.  Mental status improved without further intervention beyond treating COVID-pneumonia.  Hypertension Blood pressure was consistently elevated to the [id number] systolic.  Patient was continued on home medications, though home amlodipine was increased to 5 mg daily.   All other chronic conditions were stable.    Issues for Follow Up:  Follow-up with PCP to monitor for any signs of worsening after out of quarantine period. BP was persistently elevated and home amlodipine was increased to 5 mg daily, monitor outpatient  Significant Procedures: None  Significant Labs and Imaging:  Recent Labs  Lab 06/25/20 0311 06/26/20 0453 06/27/20 0333  WBC 7.6 7.3 6.8  HGB 12.4 13.0 12.6  HCT 38.1 40.2 39.3  PLT 265 306 282   Recent Labs  Lab 06/22/20 1440 06/23/20 0158  NA 142 142  K 3.6 3.5  CL 105 107  CO2 25 22  GLUCOSE 107* 114*  BUN 13 13  CREATININE 0.80 0.80  CALCIUM 8.9 9.1  ALKPHOS 69  --   AST 42*  --   ALT 22  --   ALBUMIN 3.1*  --     No results found.   Results/Tests Pending at Time of Discharge: None  Discharge Medications:  Allergies as of 06/27/2020   No Known Allergies      Medication List     TAKE these medications    acetaminophen 325 MG tablet Commonly known as: TYLENOL Take 650 mg by mouth every 6 (six) hours as needed.   amLODipine 5 MG tablet Commonly known as: NORVASC Take 1 tablet (5 mg total) by mouth at bedtime. What changed:  medication strength how much to take   aspirin EC 81 MG tablet Take 81 mg by mouth at  bedtime.   atorvastatin 20 MG tablet Commonly known as: LIPITOR TAKE 1 TABLET BY MOUTH EVERY DAY What changed: when to take this   benzonatate 100 MG capsule Commonly known as: TESSALON Take 1 capsule (100 mg total) by mouth 2 (two) times daily as needed for cough.   candesartan 8 MG tablet Commonly known as: ATACAND Take 8 mg  by mouth at bedtime.   glucose blood test strip Use as instructed   ONE TOUCH DELICA LANCING DEV Misc 1 Device by Does not apply route daily as needed.   OneTouch Delica Lancets 16X Misc 1 Units by Does not apply route daily as needed.   OneTouch Verio Flex System w/Device Kit Check once daily in the morning before breakfast   Vitamin D 50 MCG (2000 UT) tablet Take 2,000 Units by mouth daily.               Durable Medical Equipment  (From admission, onward)           Start     Ordered   06/23/20 1603  For home use only DME Hospital bed  Once       Question Answer Comment  Length of Need 6 Months   The above medical condition requires: Patient requires the ability to reposition frequently   Head must be elevated greater than: 30 degrees   Bed type Semi-electric   Hoyer Lift Yes   Support Surface: Gel Overlay      06/23/20 1603   06/23/20 1603  For home use only DME lightweight manual wheelchair with seat cushion  Once       Comments: Patient suffers from weakness which impairs their ability to perform daily activities like bathing and grooming in the home.  A walker will not resolve  issue with performing activities of daily living. A wheelchair will allow patient to safely perform daily activities. Patient is not able to propel themselves in the home using a standard weight wheelchair due to general weakness. Patient can self propel in the lightweight wheelchair. Length of need Lifetime. Accessories: elevating leg rests (ELRs), wheel locks, extensions and anti-tippers.   06/23/20 1603            Discharge Instructions: Please refer to Patient Instructions section of EMR for full details.  Patient was counseled important signs and symptoms that should prompt return to medical care, changes in medications, dietary instructions, activity restrictions, and follow up appointments.   Follow-Up Appointments:  Follow-up Information     [place] [place] Follow up.   Specialty: Home Health Services Why: The home health agency will contact you for the first home visit. Contact information: [address] [place] [address] [phone]         Wilber Oliphant, MD. Go on 07/03/2020.   Specialty: Family Medicine Why: Virtual appt at 9:30 AM Contact information: [address] [place] [address] [phone]                 Honor Junes, MD 06/27/2020, 11:18 AM PGY-1, [place]

## 2020-06-26 NOTE — Progress Notes (Signed)
Inpatient Diabetes Program Recommendations  AACE/ADA: New Consensus Statement on Inpatient Glycemic Control (2015)  Target Ranges:  Prepandial:   less than 140 mg/dL      Peak postprandial:   less than 180 mg/dL (1-2 hours)      Critically ill patients:  140 - 180 mg/dL   Lab Results  Component Value Date   GLUCAP 317 (H) 06/26/2020   HGBA1C 6.1 04/10/2020    Review of Glycemic Control Results for RAINELLE, SULEWSKI (MRN [id number]) as of 06/26/2020 15:05  Ref. Range 06/25/2020 16:32 06/25/2020 21:09 06/26/2020 08:54 06/26/2020 12:56  Glucose-Capillary Latest Ref Range: 70 - 99 mg/dL 993 (H) 570 (H) 177 (H) 317 (H)    Inpatient Diabetes Program Recommendations:    In the setting of steroids, consider adding Levemir 5 units BID.  Thanks, Lujean Rave, MSN, RNC-OB Diabetes Coordinator [phone] (8a-5p)

## 2020-06-26 NOTE — Progress Notes (Signed)
Family Medicine Teaching Service Daily Progress Note Intern Pager: [phone]  Patient name: Mary Walters Medical record number: [id number] Date of birth: November 18, 1928 Age: 87 y.o. Gender: female  Primary Care Provider: Melene Plan, MD Consultants: None Code Status: DNR  Pt Overview and Major Events to Date:  06/22/2020: Admitted to F PTS  Assessment and Plan: Ms. Kief is a 87 year old female who presented with falls and altered mental status and found to have COVID-19 infection.  PMH significant for hypertension, hyperlipidemia, stroke, type 2 diabetes mellitus, dementia.  COVID-19 pneumonia Breathing comfortably on room air, no oxygen requirement overnight. --Remdesivir per pharmacy (day 4/5) -- Decadron 6 mg (day 4/10) -- Vitals per floor --Continuous pulse ox, goal saturation more than 88% --Intensive spirometry  Acute encephalopathy-resolved Patient has history of vascular dementia.  Patient is at her baseline and oriented to self only but extremely pleasant.  TOC consult for home health needs and supplies.  Case manager contacted this morning and waiting for more information. --Fall and delirium precautions --Appreciate PT/OT/SLP assistance --Diet per SLP --Home aspirin 81 mg and statin  Hypertension SBP 26-178, DBP 70-109, most likely in the setting of Decadron use. --c/w home amlodipine 5 mg --c/w irbesartan 75 mg nightly, can increase irbesartan to 150 mg nightly if BP continues to significantly elevated. --Monitor closely  T2DM CBG 7 this a.m., most likely due to Decadron use.  Received 10 units aspart yesterday. --c/w sSSI -- Data CBGs   FEN/GI: Soft diet PPx: Lovenox   Status is: Inpatient  Remains inpatient appropriate because:Unsafe d/c plan  Dispo: The patient is from: Home              Anticipated d/c is to: Home              Patient currently is medically stable to d/c.   Difficult to place patient No        Subjective:  No acute  overnight events. Evaluated at bedside this morning and denies any new complaints.  Objective: Temp:  [97.6 F (36.4 C)-98.1 F (36.7 C)] 97.6 F (36.4 C) (06/13 0736) Pulse Rate:  [76-99] 86 (06/13 0736) Resp:  [10-20] 18 (06/13 0736) BP: (126-165)/(85-109) 165/108 (06/13 0736) SpO2:  [96 %-98 %] 98 % (06/13 0736) Physical Exam: General: Extremely pleasant female sitting comfortably in bed, NAD Cardiovascular: RRR  Respiratory: breathing comfortably on room air Abdomen: Soft, nontender, nondistended, bowel sounds present Extremities: No edema appreciated  Laboratory: Recent Labs  Lab 06/24/20 0150 06/25/20 0311 06/26/20 0453  WBC 5.9 7.6 7.3  HGB 11.6* 12.4 13.0  HCT 36.0 38.1 40.2  PLT 210 265 306   Recent Labs  Lab 06/22/20 1440 06/23/20 0158  NA 142 142  K 3.6 3.5  CL 105 107  CO2 25 22  BUN 13 13  CREATININE 0.80 0.80  CALCIUM 8.9 9.1  PROT 7.4  --   BILITOT 0.7  --   ALKPHOS 69  --   ALT 22  --   AST 42*  --   GLUCOSE 107* 114*     Imaging/Diagnostic Tests: No results found.   Arnoldo Lenis, MD 06/26/2020, 1:20 PM PGY-1, [facility] Family Medicine FPTS Intern pager: [phone], text pages welcome

## 2020-06-26 NOTE — TOC Progression Note (Signed)
Transition of Care [facility]) - Progression Note    Patient Details  Name: Mary Walters MRN: [id number] Date of Birth: 1928-07-23  Transition of Care [facility]) CM/SW Contact  Beckie Busing, RN Phone Number:[phone]  06/26/2020, 3:41 PM  Clinical Narrative:    CM received message from MD to inquire when DME will be delivered to the home. CM spoke with Lucrecia at [employer] who states that she has processed the order and the request has been sent for delivery. [employer] is unable to give ETA or verify that DME (wheelchair/ hospital bed) will arrive today   Expected Discharge Plan: Home w Home Health Services Barriers to Discharge: Continued Medical Work up  Expected Discharge Plan and Services Expected Discharge Plan: Home w Home Health Services   Discharge Planning Services: CM Consult Post Acute Care Choice: Durable Medical Equipment, Home Health Living arrangements for the past 2 months: Single Family Home                 DME Arranged: Hospital bed, Lightweight manual wheelchair with seat cushion DME Agency: [employer] Date DME Agency Contacted: 06/23/20   Representative spoke with at DME Agency: Velna Hatchet HH Arranged: PT, OT [facility] Agency: [facility] Date [facility] Agency Contacted: 06/23/20   Representative spoke with at [facility] Agency: Marylene Land   Social Determinants of Health (SDOH) Interventions    Readmission Risk Interventions Readmission Risk Prevention Plan 04/11/2018  Post Dischage Appt Complete  Medication Screening Complete  Transportation Screening Complete  Some recent data might be hidden

## 2020-06-26 NOTE — Progress Notes (Signed)
FPTS Interim Progress Note  Patient sleeping and resting comfortably.  Rounded with primary RN.  No concerns voiced.  No orders required.  Appreciated nightly round.  Today's Vitals   06/25/20 1938 06/25/20 2002 06/25/20 2314 06/26/20 0432  BP:  (!) 142/85 (!) 142/85 (!) 126/109  Pulse:  99 79 76  Resp: 10 19 19 20   Temp:  98.1 F (36.7 C) 98 F (36.7 C) 98.1 F (36.7 C)  TempSrc:  Oral Oral Axillary  SpO2:  96% 96% 96%  Weight:      Height:      PainSc: 0-No pain        , MD 06/26/2020, 4:47 AM PGY-2, [facility] Family Medicine Service pager [phone]

## 2020-06-26 NOTE — Plan of Care (Signed)
°  Problem: Clinical Measurements: °Goal: Respiratory complications will improve °Outcome: Progressing °  °Problem: Pain Managment: °Goal: General experience of comfort will improve °Outcome: Progressing °  °Problem: Safety: °Goal: Ability to remain free from injury will improve °Outcome: Progressing °  °

## 2020-06-26 NOTE — Progress Notes (Signed)
Physical Therapy Treatment Patient Details Name: Mary Walters MRN: [id number] DOB: [birth year] Today's Date: 06/26/2020    History of Present Illness Pt is a 87 y.o. female presenting to ED on 06/22/20 with known COVID-19 infection (positive test 6/5), AMS and recurrent falls. Patient admitted with COVID PNA, acute encephalopathy. MRI negative for acute abnormality. PMH significant for HTN, HLD, CVA, DMII, vascular dementia.    PT Comments    Pt admitted with above diagnosis. Pt was able to perform sit to stand partially with pad use and max assist of 2.  Needs min guard to max assist to sit EOB varying as pt can lose balance at EOB and need assist.  Pt overall is +2 assist at current level.  Pt currently with functional limitations due to balance and endurance deficits. Pt will benefit from skilled PT to increase their independence and safety with mobility to allow discharge to the venue listed below.      Follow Up Recommendations  Home health PT;Supervision/Assistance - 24 hour     Equipment Recommendations  Wheelchair (measurements PT);Wheelchair cushion (measurements PT);Hospital bed    Recommendations for Other Services       Precautions / Restrictions Precautions Precautions: Fall Precaution Comments: COVID Restrictions Weight Bearing Restrictions: No    Mobility  Bed Mobility Overal bed mobility: Needs Assistance Bed Mobility: Rolling;Supine to Sit;Sit to Supine Rolling: Min assist;Mod assist;+2 for physical assistance   Supine to sit: Mod assist;Min assist Sit to supine: Mod assist;+2 for physical assistance;Max assist   General bed mobility comments: Pt wet with urine on arrival.  cleaned pt with total assist. Patient requires Max multimodal cues for all parts of bed mobility. Able to roll R with Min A but requries Mod A and increased time/effort to roll to L. Mod A for supine to EOB at BLE. Patient able to elevate trunk. Mod A +2 at trunk and BLE for return to  supine.    Transfers Overall transfer level: Needs assistance Equipment used: 2 person hand held assist Transfers: Sit to/from Stand;Lateral/Scoot Transfers Sit to Stand: Max assist;+2 physical assistance;+2 safety/equipment         General transfer comment: Two standing trials with maxA+2  and HHA, bil knees blocked, pt able to offload buttocks but unable to acehive fully upright; could not take steps to [facility] but was able to adjust pad and scoot pt laterally to [facility] with pad  Ambulation/Gait             General Gait Details: Unable this session   Stairs             Wheelchair Mobility    Modified Rankin (Stroke Patients Only)       Balance Overall balance assessment: Needs assistance Sitting-balance support: Bilateral upper extremity supported;Feet supported Sitting balance-Leahy Scale: Poor Sitting balance - Comments: Inconsistent seated balance flucutating from min guard (brief bouts) to maxA with increase fatigue; inconsistently pushing into extension and leaning to right   Standing balance support: Bilateral upper extremity supported Standing balance-Leahy Scale: Zero Standing balance comment: Unable to extend hips to come fully upright with 2 attempts, bilateral knee block and HHA                            Cognition Arousal/Alertness: Awake/alert Behavior During Therapy: WFL for tasks assessed/performed Overall Cognitive Status: History of cognitive impairments - at baseline  General Comments: Hx of dementia. Pleasantly confused.  Patient with "parrot like" speech this date often repeating therapists words and actions.      Exercises      General Comments General comments (skin integrity, edema, etc.): VSS during session with pt on 1L O2      Pertinent Vitals/Pain Pain Assessment: No/denies pain    Home Living                      Prior Function            PT Goals  (current goals can now be found in the care plan section) Progress towards PT goals: Progressing toward goals    Frequency    Min 3X/week      PT Plan Current plan remains appropriate    Co-evaluation              AM-PAC PT "6 Clicks" Mobility   Outcome Measure  Help needed turning from your back to your side while in a flat bed without using bedrails?: A Lot Help needed moving from lying on your back to sitting on the side of a flat bed without using bedrails?: A Lot Help needed moving to and from a bed to a chair (including a wheelchair)?: Total Help needed standing up from a chair using your arms (e.g., wheelchair or bedside chair)?: Total Help needed to walk in hospital room?: Total Help needed climbing 3-5 steps with a railing? : Total 6 Click Score: 8    End of Session Equipment Utilized During Treatment: Gait belt Activity Tolerance: Patient tolerated treatment well;Patient limited by fatigue Patient left: in bed;with call bell/phone within reach;with bed alarm set (bed in modified chair position) Nurse Communication: Mobility status PT Visit Diagnosis: Other abnormalities of gait and mobility (R26.89);Muscle weakness (generalized) (M62.81)     Time: 1245-8099 PT Time Calculation (min) (ACUTE ONLY): 23 min  Charges:  $Therapeutic Activity: 23-37 mins                     Tina Temme M,PT Acute Rehab Services [phone] (pager)    Bevelyn Buckles 06/26/2020, 3:37 PM

## 2020-06-27 DIAGNOSIS — E1122 Type 2 diabetes mellitus with diabetic chronic kidney disease: Secondary | ICD-10-CM

## 2020-06-27 DIAGNOSIS — N183 Chronic kidney disease, stage 3 unspecified: Secondary | ICD-10-CM

## 2020-06-27 LAB — CBC WITH DIFFERENTIAL/PLATELET
Abs Immature Granulocytes: 0.25 10*3/uL — ABNORMAL HIGH (ref 0.00–0.07)
Basophils Absolute: 0 10*3/uL (ref 0.0–0.1)
Basophils Relative: 0 %
Eosinophils Absolute: 0 10*3/uL (ref 0.0–0.5)
Eosinophils Relative: 0 %
HCT: 39.3 % (ref 36.0–46.0)
Hemoglobin: 12.6 g/dL (ref 12.0–15.0)
Immature Granulocytes: 4 %
Lymphocytes Relative: 19 %
Lymphs Abs: 1.3 10*3/uL (ref 0.7–4.0)
MCH: 26.5 pg (ref 26.0–34.0)
MCHC: 32.1 g/dL (ref 30.0–36.0)
MCV: 82.6 fL (ref 80.0–100.0)
Monocytes Absolute: 0.7 10*3/uL (ref 0.1–1.0)
Monocytes Relative: 10 %
Neutro Abs: 4.6 10*3/uL (ref 1.7–7.7)
Neutrophils Relative %: 67 %
Platelets: 282 10*3/uL (ref 150–400)
RBC: 4.76 MIL/uL (ref 3.87–5.11)
RDW: 14.6 % (ref 11.5–15.5)
WBC: 6.8 10*3/uL (ref 4.0–10.5)
nRBC: 0 % (ref 0.0–0.2)

## 2020-06-27 LAB — CULTURE, BLOOD (ROUTINE X 2)
Culture: NO GROWTH
Culture: NO GROWTH
Special Requests: ADEQUATE

## 2020-06-27 LAB — GLUCOSE, CAPILLARY
Glucose-Capillary: 190 mg/dL — ABNORMAL HIGH (ref 70–99)
Glucose-Capillary: 175 mg/dL — ABNORMAL HIGH (ref 70–99)
Glucose-Capillary: 207 mg/dL — ABNORMAL HIGH (ref 70–99)

## 2020-06-27 MED ORDER — AMLODIPINE BESYLATE 5 MG PO TABS: 5.0000 mg | ORAL_TABLET | Freq: Every day | ORAL | 0 refills | Status: DC

## 2020-06-27 NOTE — Progress Notes (Signed)
FPTS Interim Progress Note  Patient sleeping and resting comfortably.  Rounded with primary RN.  No concerns voiced.  No orders required.  Appreciated nightly round.  Today's Vitals   06/26/20 1200 06/26/20 1357 06/26/20 2025 06/26/20 2040  BP: (!) 143/91  (!) 149/132 (!) 145/101  Pulse: 66  80   Resp: 15  18   Temp:   97.8 F (36.6 C)   TempSrc:   Axillary   SpO2: 91%  97%   Weight:      Height:      PainSc:  0-No pain 0-No pain      Dana Allan, MD 06/27/2020, 4:43 AM PGY-2, [facility] Family Medicine Service pager [phone]

## 2020-06-27 NOTE — Plan of Care (Signed)
  Problem: Education: Goal: Knowledge of General Education information will improve Description: Including pain rating scale, medication(s)/side effects and non-pharmacologic comfort measures Outcome: Adequate for Discharge   Problem: Health Behavior/Discharge Planning: Goal: Ability to manage health-related needs will improve Outcome: Adequate for Discharge   Problem: Clinical Measurements: Goal: Ability to maintain clinical measurements within normal limits will improve Outcome: Adequate for Discharge Goal: Will remain free from infection Outcome: Adequate for Discharge Goal: Diagnostic test results will improve Outcome: Adequate for Discharge Goal: Respiratory complications will improve Outcome: Adequate for Discharge Goal: Cardiovascular complication will be avoided Outcome: Adequate for Discharge   Problem: Activity: Goal: Risk for activity intolerance will decrease Outcome: Adequate for Discharge   Problem: Nutrition: Goal: Adequate nutrition will be maintained Outcome: Adequate for Discharge   Problem: Coping: Goal: Level of anxiety will decrease Outcome: Adequate for Discharge   Problem: Elimination: Goal: Will not experience complications related to bowel motility Outcome: Adequate for Discharge Goal: Will not experience complications related to urinary retention Outcome: Adequate for Discharge   Problem: Pain Managment: Goal: General experience of comfort will improve Outcome: Adequate for Discharge   Problem: Safety: Goal: Ability to remain free from injury will improve Outcome: Adequate for Discharge   Problem: Skin Integrity: Goal: Risk for impaired skin integrity will decrease Outcome: Adequate for Discharge   Problem: Activity: Goal: Ability to tolerate increased activity will improve Outcome: Adequate for Discharge   Problem: Clinical Measurements: Goal: Ability to maintain a body temperature in the normal range will improve Outcome: Adequate  for Discharge   Problem: Respiratory: Goal: Ability to maintain adequate ventilation will improve Outcome: Adequate for Discharge Goal: Ability to maintain a clear airway will improve Outcome: Adequate for Discharge   Problem: Education: Goal: Knowledge of risk factors and measures for prevention of condition will improve Outcome: Adequate for Discharge   Problem: Coping: Goal: Psychosocial and spiritual needs will be supported Outcome: Adequate for Discharge   Problem: Respiratory: Goal: Will maintain a patent airway Outcome: Adequate for Discharge Goal: Complications related to the disease process, condition or treatment will be avoided or minimized Outcome: Adequate for Discharge

## 2020-06-27 NOTE — Progress Notes (Signed)
Called daughter in law and reviewed AVS paperwork. All questions answered.

## 2020-06-27 NOTE — TOC Transition Note (Signed)
Transition of Care [facility]) - CM/SW Discharge Note   Patient Details  Name: Mary Walters MRN: [id number] Date of Birth: 12-14-1928  [facility] CM/SW Contact:  Beckie Busing, RN Phone Number:[phone]  06/27/2020, 1:41 PM   Clinical Narrative:    Daughter in law [name] called to make CM aware that DME has been delivered. Transport has been set up with [facility]. D/c packet is at the nurses station.    Final next level of care: Home w Home Health Services Barriers to Discharge: No Barriers Identified   Patient Goals and CMS Choice   CMS Medicare.gov Compare Post Acute Care list provided to:: Patient Represenative (must comment) Choice offered to / list presented to : Adult Children  Discharge Placement                       Discharge Plan and Services   Discharge Planning Services: CM Consult Post Acute Care Choice: Durable Medical Equipment, Home Health          DME Arranged: Hospital bed, Lightweight manual wheelchair with seat cushion DME Agency: [employer] Date DME Agency Contacted: 06/23/20   Representative spoke with at DME Agency: Velna Hatchet HH Arranged: PT, OT [facility] Agency: [facility] Date [facility] Agency Contacted: 06/23/20   Representative spoke with at [facility] Agency: Marylene Land  Social Determinants of Health (SDOH) Interventions     Readmission Risk Interventions Readmission Risk Prevention Plan 04/11/2018  Post Dischage Appt Complete  Medication Screening Complete  Transportation Screening Complete  Some recent data might be hidden

## 2020-06-27 NOTE — Plan of Care (Signed)
  Problem: Respiratory: Goal: Ability to maintain a clear airway will improve Outcome: Progressing   Problem: Respiratory: Goal: Will maintain a patent airway Outcome: Progressing   Problem: Respiratory: Goal: Complications related to the disease process, condition or treatment will be avoided or minimized Outcome: Progressing

## 2020-06-27 NOTE — TOC Progression Note (Signed)
Transition of Care [facility]) - Progression Note    Patient Details  Name: Mary Walters MRN: [id number] Date of Birth: [birth year]  Transition of Care [facility]) CM/SW Contact  Beckie Busing, RN Phone [phone]  06/27/2020, 10:33 AM  Clinical Narrative:    CM received call from patients daughter in law [name] stating that she would need transport to get patient home and would like update on the status of DME delivery. CM informed [name] that transportation could be set up and that [employer] would call her prior to delivery. CM provided [name] with CM number to call for and further assistance.    Expected Discharge Plan: Home w Home Health Services Barriers to Discharge: Continued Medical Work up  Expected Discharge Plan and Services Expected Discharge Plan: Home w Home Health Services   Discharge Planning Services: CM Consult Post Acute Care Choice: Durable Medical Equipment, Home Health Living arrangements for the past 2 months: Single Family Home                 DME Arranged: Hospital bed, Lightweight manual wheelchair with seat cushion DME Agency: [employer] Date DME Agency Contacted: 06/23/20   Representative spoke with at DME Agency: Velna Hatchet HH Arranged: PT, OT [facility] Agency: [facility] Date [facility] Agency Contacted: 06/23/20   Representative spoke with at [facility] Agency: Marylene Land   Social Determinants of Health (SDOH) Interventions    Readmission Risk Interventions Readmission Risk Prevention Plan 04/11/2018  Post Dischage Appt Complete  Medication Screening Complete  Transportation Screening Complete  Some recent data might be hidden

## 2020-06-27 NOTE — Progress Notes (Signed)
Occupational Therapy Treatment Patient Details Name: Mary Walters MRN: [id number] DOB: [birth year] Today's Date: 06/27/2020    History of present illness Pt is a 87 y.o. female presenting to ED on 06/22/20 with known COVID-19 infection (positive test 6/5), AMS and recurrent falls. Patient admitted with COVID PNA, acute encephalopathy. MRI negative for acute abnormality. PMH significant for HTN, HLD, CVA, DMII, vascular dementia.   OT comments  Pt making steady progress towards OT goals this session. Pt received supine in bed pleasantly confused and agreeable to OT intervention. Pt continues to present with impaired balance, cognitive deficits, and decreased activity tolerance. Pt currently requires set- up assist for UB grooming tasks from EOB and MAX A +1 for sit<>stand with Rw. Pt following commands with increased time but is also hard of hearing needing increased time to process. Pt on RA during session with SpO2 >94%. Worked on using IS with pt pulling . Pt would continue to benefit from skilled occupational therapy while admitted and after d/c to address the below listed limitations in order to improve overall functional mobility and facilitate independence with BADL participation. DC plan remains appropriate, will follow acutely per POC.     Follow Up Recommendations  Home health OT;Supervision/Assistance - 24 hour    Equipment Recommendations  Wheelchair (measurements OT);Wheelchair cushion (measurements OT);Hospital bed    Recommendations for Other Services      Precautions / Restrictions Precautions Precautions: Fall Precaution Comments: COVID+ Restrictions Weight Bearing Restrictions: No       Mobility Bed Mobility Overal bed mobility: Needs Assistance Bed Mobility: Supine to Sit;Sit to Supine     Supine to sit: Max assist Sit to supine: Max assist   General bed mobility comments: pt able to assist with advancing BLEs to EOB but requried assist to elevate trunk  and fully scoot hips to EOB with use of bed pad, MAX A +1 to return pt to supine with pt needing max multimodal cues to initiate coming down onto pts L shoulder    Transfers Overall transfer level: Needs assistance Equipment used: Rolling walker (2 wheeled) Transfers: Sit to/from Stand Sit to Stand: Max assist         General transfer comment: pt able to sit<>stand with MAX A +1 and RW, cues needed for hand placement and use of gait belt in conjunction with bed pad to assist with elevating hips off EOB, pt able to stand ~ 15 seconds before fatiguing    Balance Overall balance assessment: Needs assistance Sitting-balance support: Bilateral upper extremity supported;Feet supported Sitting balance-Leahy Scale: Fair Sitting balance - Comments: minguard for static sitting balance   Standing balance support: Bilateral upper extremity supported Standing balance-Leahy Scale: Poor Standing balance comment: reliant on BUE support and external assist                           ADL either performed or assessed with clinical judgement   ADL Overall ADL's : Needs assistance/impaired     Grooming: Wash/dry face;Supervision/safety;Set up Grooming Details (indicate cue type and reason): pt able to wash face from EOB with set- up assist                 Toilet Transfer: Maximal assistance (sit<>stand only)           Functional mobility during ADLs: Maximal assistance;Rolling walker (sit<>stand only with Rw) General ADL Comments: pt continues to present with cognitive deficits, impaired activity tolerance and impaired balance  Vision       Perception     Praxis      Cognition Arousal/Alertness: Awake/alert Behavior During Therapy: WFL for tasks assessed/performed Overall Cognitive Status: History of cognitive impairments - at baseline                                 General Comments: pt with history of dementia, following commands with increased  time. very HOH needing repetition        Exercises Other Exercises Other Exercises: worked on using IS pt pulling 250 mL   Shoulder Instructions       General Comments pt on RA during session with SpO2 >94% during session    Pertinent Vitals/ Pain       Pain Assessment: No/denies pain  Home Living                                          Prior Functioning/Environment              Frequency  Min 2X/week        Progress Toward Goals  OT Goals(current goals can now be found in the care plan section)  Progress towards OT goals: Progressing toward goals  Acute Rehab OT Goals OT Goal Formulation: Patient unable to participate in goal setting Time For Goal Achievement: 07/07/20 Potential to Achieve Goals: Fair  Plan Discharge plan remains appropriate;Frequency remains appropriate    Co-evaluation                 AM-PAC OT "6 Clicks" Daily Activity     Outcome Measure   Help from another person eating meals?: A Lot Help from another person taking care of personal grooming?: A Lot Help from another person toileting, which includes using toliet, bedpan, or urinal?: Total Help from another person bathing (including washing, rinsing, drying)?: Total Help from another person to put on and taking off regular upper body clothing?: A Lot Help from another person to put on and taking off regular lower body clothing?: Total 6 Click Score: 9    End of Session Equipment Utilized During Treatment: Gait belt;Rolling walker  OT Visit Diagnosis: Unsteadiness on feet (R26.81);Other abnormalities of gait and mobility (R26.89);Muscle weakness (generalized) (M62.81);Repeated falls (R29.6);Other symptoms and signs involving cognitive function   Activity Tolerance Patient tolerated treatment well   Patient Left in bed;with call bell/phone within reach;with bed alarm set   Nurse Communication Mobility status        Time: 5277-8242 OT Time Calculation  (min): 22 min  Charges: OT General Charges $OT Visit: 1 Visit OT Treatments $Self Care/Home Management : 8-22 mins  Lenor Derrick., COTA/L Acute Rehabilitation Services [phone]    Barron Schmid 06/27/2020, 2:14 PM

## 2020-06-28 DIAGNOSIS — Z28310 Unvaccinated for covid-19: Secondary | ICD-10-CM | POA: Diagnosis not present

## 2020-06-28 DIAGNOSIS — E1140 Type 2 diabetes mellitus with diabetic neuropathy, unspecified: Secondary | ICD-10-CM | POA: Diagnosis not present

## 2020-06-28 DIAGNOSIS — Z743 Need for continuous supervision: Secondary | ICD-10-CM | POA: Diagnosis not present

## 2020-06-28 DIAGNOSIS — E1122 Type 2 diabetes mellitus with diabetic chronic kidney disease: Secondary | ICD-10-CM | POA: Diagnosis not present

## 2020-06-28 DIAGNOSIS — R69 Illness, unspecified: Secondary | ICD-10-CM | POA: Diagnosis not present

## 2020-06-28 DIAGNOSIS — U071 COVID-19: Secondary | ICD-10-CM | POA: Diagnosis not present

## 2020-06-28 DIAGNOSIS — R531 Weakness: Secondary | ICD-10-CM | POA: Diagnosis not present

## 2020-06-28 DIAGNOSIS — E1159 Type 2 diabetes mellitus with other circulatory complications: Secondary | ICD-10-CM | POA: Diagnosis not present

## 2020-06-28 DIAGNOSIS — I69354 Hemiplegia and hemiparesis following cerebral infarction affecting left non-dominant side: Secondary | ICD-10-CM | POA: Diagnosis not present

## 2020-06-28 DIAGNOSIS — J1282 Pneumonia due to coronavirus disease 2019: Secondary | ICD-10-CM | POA: Diagnosis not present

## 2020-06-28 DIAGNOSIS — I10 Essential (primary) hypertension: Secondary | ICD-10-CM | POA: Diagnosis not present

## 2020-06-28 DIAGNOSIS — N183 Chronic kidney disease, stage 3 unspecified: Secondary | ICD-10-CM | POA: Diagnosis not present

## 2020-06-29 ENCOUNTER — Telehealth: Payer: Self-pay

## 2020-06-29 NOTE — Telephone Encounter (Signed)
Monique from [facility] calling for PT verbal orders as follows:  1 time(s) weekly for 1 week(s), then 2 time(s) weekly for 4 week(s), then 1 time weekly for 2 weeks.  Verbal orders given per [facility] protocol.   Veronda Prude, RN

## 2020-06-30 DIAGNOSIS — R208 Other disturbances of skin sensation: Secondary | ICD-10-CM | POA: Diagnosis not present

## 2020-06-30 DIAGNOSIS — J1282 Pneumonia due to coronavirus disease 2019: Secondary | ICD-10-CM | POA: Diagnosis not present

## 2020-06-30 DIAGNOSIS — N183 Chronic kidney disease, stage 3 unspecified: Secondary | ICD-10-CM | POA: Diagnosis not present

## 2020-06-30 DIAGNOSIS — E1122 Type 2 diabetes mellitus with diabetic chronic kidney disease: Secondary | ICD-10-CM | POA: Diagnosis not present

## 2020-06-30 DIAGNOSIS — U071 COVID-19: Secondary | ICD-10-CM | POA: Diagnosis not present

## 2020-06-30 DIAGNOSIS — R69 Illness, unspecified: Secondary | ICD-10-CM | POA: Diagnosis not present

## 2020-06-30 DIAGNOSIS — Z28310 Unvaccinated for covid-19: Secondary | ICD-10-CM | POA: Diagnosis not present

## 2020-06-30 DIAGNOSIS — I10 Essential (primary) hypertension: Secondary | ICD-10-CM | POA: Diagnosis not present

## 2020-06-30 DIAGNOSIS — E1140 Type 2 diabetes mellitus with diabetic neuropathy, unspecified: Secondary | ICD-10-CM | POA: Diagnosis not present

## 2020-06-30 DIAGNOSIS — R0902 Hypoxemia: Secondary | ICD-10-CM | POA: Diagnosis not present

## 2020-06-30 DIAGNOSIS — E1159 Type 2 diabetes mellitus with other circulatory complications: Secondary | ICD-10-CM | POA: Diagnosis not present

## 2020-06-30 DIAGNOSIS — I69354 Hemiplegia and hemiparesis following cerebral infarction affecting left non-dominant side: Secondary | ICD-10-CM | POA: Diagnosis not present

## 2020-07-02 NOTE — Progress Notes (Signed)
[facility] Telemedicine Visit  Patient consented to have virtual visit and was identified by name and date of birth. Method of visit: Telephone  Encounter participants: Patient: Mary Walters - located at home Provider: Dollene Cleveland - located at clinic Others (if applicable): daughter Mary Walters  Chief Complaint: Hospital follow up: patient recently admitted 6/7 - 6/14   COVID-19 Pneumonia Patient presented with fall/mental status in the setting of COVID-19 positive.  She was found to have pneumonia and was noted to have altered mental status, likely related to acute infection. MRI negative for acute changes. Patient started on remdesivir and Decadron per protocol. (4-day course). Patient was placed on 1 L O2 due to initial desaturations, but was able to be quickly weaned off ventilator and maintained appropriate saturations on room air.  Family reports that when the patient drinks that she occasionally becomes short of breath, but they deny any coughing or other difficulty otherwise.   Hypertension Blood pressure was consistently elevated to the 140s-180 systolic.  Patient was continued on home medications, though home amlodipine was increased to 5 mg daily. -Nurses check in Tuesday and Thursdays, Mary Walters would like CNA to come out to the house but this is currently unavailable.  She does not not know what the most recent blood pressures have been.  Memory Difficulty/AMS/Vascular Dementia Ms. Mary Walters reports that Ms. Mary Walters still does not know who her family members are. Ms. Mary Walters reports that since the beginning of 2022 the patient has been more forgetful and often goes back to her "younger days".  Starting around May 25 the patient has not wanted to get out of bed or eat, but family has cared for her and would encourage her to eat.  She continues to have difficulty standing by herself.  Concern for recent decline: There is concerned that Ms. Mary Walters has  experienced a gradual, steady decline in her health over the last 6 months or so.  There is potential that the patient may see some improvement after healing from COVID; however, there is concern the patient is "dwindling" and that end-of-life may be approaching.  I spoke with the patient's son Mr. Mary Walters about the possibility of calling in palliative care for additional information and resources and taking care of Ms. Mary Walters.  He agrees to seek out more information about palliative care.  Referral for palliative care was placed at today's visit with his permission.  Pertinent PMHx:  Patient Active Problem List   Diagnosis Date Noted   Acute cognitive decline 07/03/2020   Hypoxia    Pneumonia due to COVID-19 virus 06/22/2020   Vitamin D deficiency 04/10/2020   Alkaline phosphatase elevation 09/04/2018   Type 2 diabetes mellitus with vascular disease (HCC) 08/19/2018   Diabetic neuropathy (HCC) 08/19/2018   Hypertensive urgency 04/10/2018   Fall 12/08/2017   Vascular dementia of acute onset without behavioral disturbance (HCC) 09/05/2017   Urge incontinence 09/05/2017   Decreased sensation of foot 09/04/2017   Onychomycosis of toenail 09/04/2017   Memory difficulties 08/06/2017   HLD (hyperlipidemia) 06/02/2017   CKD (chronic kidney disease), stage III (HCC) 06/02/2017   Stroke (cerebrum) (HCC) 05/14/2017   Type 2 diabetes mellitus with stage 3 chronic kidney disease, without long-term current use of insulin (HCC) 05/14/2017   Hypertension 05/14/2017     Exam:  There were no vitals taken for this visit.  Respiratory: Comfortable work of breathing on room air  Assessment/Plan:  Hypertension Unable to evaluate blood pressure at today's  visit. -Family can write down blood pressures taken by nurses on Tuesday and Thursday and report them to last -Continue amlodipine 5 mg daily  Pneumonia due to COVID-19 virus Resolved.  Patient is doing slightly better since  hospitalization.  No concerns for significant shortness of breath at this time.  Acute cognitive decline Patient with acute on chronic cognitive decline and forgetfulness with lower extremity weakness, decreased eating, and decreased mobility. -Patient's family agrees to palliative care referral to seek more information about resources available for Mary Walters    Time spent during visit with patient: 35 minutes   Peggyann Shoals, DO [facility], PGY-3 07/03/2020 6:14 PM

## 2020-07-03 ENCOUNTER — Telehealth (INDEPENDENT_AMBULATORY_CARE_PROVIDER_SITE_OTHER): Payer: Medicare HMO | Admitting: Family Medicine

## 2020-07-03 ENCOUNTER — Other Ambulatory Visit: Payer: Self-pay

## 2020-07-03 DIAGNOSIS — R4189 Other symptoms and signs involving cognitive functions and awareness: Secondary | ICD-10-CM | POA: Diagnosis not present

## 2020-07-03 DIAGNOSIS — I10 Essential (primary) hypertension: Secondary | ICD-10-CM

## 2020-07-03 DIAGNOSIS — J1282 Pneumonia due to coronavirus disease 2019: Secondary | ICD-10-CM | POA: Diagnosis not present

## 2020-07-03 DIAGNOSIS — U071 COVID-19: Secondary | ICD-10-CM | POA: Diagnosis not present

## 2020-07-03 NOTE — Assessment & Plan Note (Signed)
Unable to evaluate blood pressure at today's visit. -Family can write down blood pressures taken by nurses on Tuesday and Thursday and report them to last -Continue amlodipine 5 mg daily

## 2020-07-03 NOTE — Assessment & Plan Note (Signed)
Resolved.  Patient is doing slightly better since hospitalization.  No concerns for significant shortness of breath at this time.

## 2020-07-03 NOTE — Assessment & Plan Note (Signed)
Patient with acute on chronic cognitive decline and forgetfulness with lower extremity weakness, decreased eating, and decreased mobility. -Patient's family agrees to palliative care referral to seek more information about resources available for Mary Walters

## 2020-07-04 ENCOUNTER — Emergency Department (HOSPITAL_COMMUNITY)
Admission: EM | Admit: 2020-07-04 | Discharge: 2020-07-04 | Disposition: A | Discharge status: Home / Self Care | Payer: Medicare HMO | Attending: Emergency Medicine | Admitting: Emergency Medicine

## 2020-07-04 ENCOUNTER — Telehealth: Payer: Self-pay | Admitting: Family Medicine

## 2020-07-04 ENCOUNTER — Other Ambulatory Visit: Payer: Self-pay

## 2020-07-04 ENCOUNTER — Emergency Department (HOSPITAL_COMMUNITY): Payer: Medicare HMO

## 2020-07-04 ENCOUNTER — Encounter (HOSPITAL_COMMUNITY): Payer: Self-pay | Admitting: Emergency Medicine

## 2020-07-04 DIAGNOSIS — U071 COVID-19: Secondary | ICD-10-CM | POA: Diagnosis not present

## 2020-07-04 DIAGNOSIS — I10 Essential (primary) hypertension: Secondary | ICD-10-CM | POA: Diagnosis not present

## 2020-07-04 DIAGNOSIS — I129 Hypertensive chronic kidney disease with stage 1 through stage 4 chronic kidney disease, or unspecified chronic kidney disease: Secondary | ICD-10-CM | POA: Diagnosis not present

## 2020-07-04 DIAGNOSIS — Z7401 Bed confinement status: Secondary | ICD-10-CM | POA: Diagnosis not present

## 2020-07-04 DIAGNOSIS — E1122 Type 2 diabetes mellitus with diabetic chronic kidney disease: Secondary | ICD-10-CM | POA: Insufficient documentation

## 2020-07-04 DIAGNOSIS — R0603 Acute respiratory distress: Secondary | ICD-10-CM | POA: Diagnosis not present

## 2020-07-04 DIAGNOSIS — Z7982 Long term (current) use of aspirin: Secondary | ICD-10-CM | POA: Diagnosis not present

## 2020-07-04 DIAGNOSIS — R410 Disorientation, unspecified: Secondary | ICD-10-CM | POA: Diagnosis not present

## 2020-07-04 DIAGNOSIS — Z8616 Personal history of COVID-19: Secondary | ICD-10-CM | POA: Diagnosis not present

## 2020-07-04 DIAGNOSIS — R69 Illness, unspecified: Secondary | ICD-10-CM | POA: Diagnosis not present

## 2020-07-04 DIAGNOSIS — R069 Unspecified abnormalities of breathing: Secondary | ICD-10-CM | POA: Diagnosis not present

## 2020-07-04 DIAGNOSIS — E1140 Type 2 diabetes mellitus with diabetic neuropathy, unspecified: Secondary | ICD-10-CM | POA: Diagnosis not present

## 2020-07-04 DIAGNOSIS — N183 Chronic kidney disease, stage 3 unspecified: Secondary | ICD-10-CM | POA: Diagnosis not present

## 2020-07-04 DIAGNOSIS — F0390 Unspecified dementia without behavioral disturbance: Secondary | ICD-10-CM | POA: Diagnosis not present

## 2020-07-04 DIAGNOSIS — R0902 Hypoxemia: Secondary | ICD-10-CM | POA: Diagnosis not present

## 2020-07-04 DIAGNOSIS — R4182 Altered mental status, unspecified: Secondary | ICD-10-CM | POA: Diagnosis not present

## 2020-07-04 DIAGNOSIS — E1159 Type 2 diabetes mellitus with other circulatory complications: Secondary | ICD-10-CM | POA: Insufficient documentation

## 2020-07-04 DIAGNOSIS — R0600 Dyspnea, unspecified: Secondary | ICD-10-CM | POA: Diagnosis not present

## 2020-07-04 DIAGNOSIS — I517 Cardiomegaly: Secondary | ICD-10-CM | POA: Diagnosis not present

## 2020-07-04 DIAGNOSIS — J9811 Atelectasis: Secondary | ICD-10-CM | POA: Diagnosis not present

## 2020-07-04 DIAGNOSIS — Z743 Need for continuous supervision: Secondary | ICD-10-CM | POA: Diagnosis not present

## 2020-07-04 DIAGNOSIS — R0602 Shortness of breath: Secondary | ICD-10-CM | POA: Diagnosis not present

## 2020-07-04 DIAGNOSIS — Z79899 Other long term (current) drug therapy: Secondary | ICD-10-CM | POA: Insufficient documentation

## 2020-07-04 DIAGNOSIS — I469 Cardiac arrest, cause unspecified: Secondary | ICD-10-CM | POA: Diagnosis not present

## 2020-07-04 LAB — CBC WITH DIFFERENTIAL/PLATELET
Abs Immature Granulocytes: 0.07 10*3/uL (ref 0.00–0.07)
Basophils Absolute: 0 10*3/uL (ref 0.0–0.1)
Basophils Relative: 0 %
Eosinophils Absolute: 0 10*3/uL (ref 0.0–0.5)
Eosinophils Relative: 0 %
HCT: 36 % (ref 36.0–46.0)
Hemoglobin: 11.2 g/dL — ABNORMAL LOW (ref 12.0–15.0)
Immature Granulocytes: 1 %
Lymphocytes Relative: 24 %
Lymphs Abs: 1.5 10*3/uL (ref 0.7–4.0)
MCH: 26.4 pg (ref 26.0–34.0)
MCHC: 31.1 g/dL (ref 30.0–36.0)
MCV: 84.7 fL (ref 80.0–100.0)
Monocytes Absolute: 0.7 10*3/uL (ref 0.1–1.0)
Monocytes Relative: 12 %
Neutro Abs: 3.9 10*3/uL (ref 1.7–7.7)
Neutrophils Relative %: 63 %
Platelets: 230 10*3/uL (ref 150–400)
RBC: 4.25 MIL/uL (ref 3.87–5.11)
RDW: 15.1 % (ref 11.5–15.5)
WBC: 6.3 10*3/uL (ref 4.0–10.5)
nRBC: 0 % (ref 0.0–0.2)

## 2020-07-04 LAB — COMPREHENSIVE METABOLIC PANEL
ALT: 23 U/L (ref 0–44)
AST: 22 U/L (ref 15–41)
Albumin: 2.7 g/dL — ABNORMAL LOW (ref 3.5–5.0)
Alkaline Phosphatase: 68 U/L (ref 38–126)
Anion gap: 5 (ref 5–15)
BUN: 11 mg/dL (ref 8–23)
CO2: 29 mmol/L (ref 22–32)
Calcium: 9.1 mg/dL (ref 8.9–10.3)
Chloride: 106 mmol/L (ref 98–111)
Creatinine, Ser: 0.79 mg/dL (ref 0.44–1.00)
GFR, Estimated: >60 mL/min (ref >60)
Glucose, Bld: 165 mg/dL — ABNORMAL HIGH (ref 70–99)
Potassium: 3.8 mmol/L (ref 3.5–5.1)
Sodium: 140 mmol/L (ref 135–145)
Total Bilirubin: 0.8 mg/dL (ref 0.3–1.2)
Total Protein: 6.3 g/dL — ABNORMAL LOW (ref 6.5–8.1)

## 2020-07-04 NOTE — ED Triage Notes (Signed)
Pt BIB [facility] from home c/o worsening COVID sx. Family noticed today that pt had increasingly shallow respirations with increased difficulty breathing. Lung sounds clear.  EMS VS- 95% RA, 138/80, HR 88, 16 RR, CBG 177

## 2020-07-04 NOTE — ED Notes (Signed)
[facility] called, states it will take a little  while

## 2020-07-04 NOTE — ED Notes (Signed)
Pt resting at this time. VS WNL. Visible chest rise and fall noted. Pt appears in NAD.  

## 2020-07-04 NOTE — Discharge Instructions (Addendum)
Return for any problem.  Follow-up with your regular care providers at the family practice clinic as instructed.

## 2020-07-04 NOTE — ED Notes (Signed)
Called [facility], she is the next one on the list

## 2020-07-04 NOTE — Consult Note (Addendum)
Family Medicine Teaching Service Consult Note Service Pager: [phone]  Patient name: Mary Walters Medical record number: [id number] Date of birth: [birth year] Age: 87 y.o. Gender: female  Primary Care Provider: Wilber Oliphant, MD Consultants: None Code Status: DNR  Preferred Emergency Contact: Zada Finders [phone]  Chief Complaint: Shallow breathing at home  Assessment and Plan: Mary Walters is a 87 y.o. female presenting after shallow breathing at home via EMS. PMH is significant for HTN, HLD, CVA, type 2 diabetes, dementia and COVID-19.  Goals of care Patient presented to ED via EMS after shallow breathing at home.  On presentation afebrile, RR 15, BP 137/116, breathing normally on room air  (satting well).  No leukocytosis, no significant electrolyte abnormalities.  Chest x-ray shows bibasilar atelectasis with interval improvement in pulmonary infiltrates from her last admission due to COVID-19 pneumonia. Family requested [facility] evaluation as we are pt's PCP. Evaluated at bedside along with her daughter-in-law present in room.  Pt pleasantly demented and satting well on room air. Normal respiratory exam and ED evaluation. CXR showing improvement.   Discussed goals of care for patient as patient has been declining rapidly in her mental status after COVID-19 infection.  Family is in agreement with hospice and palliative care.  Long discussion about what hospice and palliative care entails and their help with patient's overall care.  Also, suggested to have pulse ox at home to measure oxygen level in case of similar complaints again. Family is open to obtaining palliative resources.  --Recommend Palliative care resources daughter-in-law Sunday Spillers) amendable  --Pulse ox for home use --Consider CTA, if patient returns with respiratory complaints in setting of recent COVID infection  --SW consulted in ED   History of Present Illness:   Mary Walters is a 87 y.o. female presenting  after shallow breathing at home.  Patient's daughter has concerned that patient was having increased work of breathing and appeared to be in distress this a.m.    Patient is a level 5 caveat and pleasantly demented. History provided by daughter-in-law and ED notes.   ED provider consulted family medicine to ease the stress of family as patient is an established patient with family practice. Goals of care discussion with daughter-in-law about hospice care and palliative care for good quality of patient's life. Denies any chest pain, cough, abdominal pain, any other complaints. Daughter-in-law is taking care of pt at home and watches over her closely. She has also noticed sharp decline since COVID diagnosis. Family is feeding her and ensuring adequate hydration.   Review Of Systems:   Review of Systems  Reason unable to perform ROS: Level 5 caveat.    Patient Active Problem List   Diagnosis Date Noted   Acute cognitive decline 07/03/2020   Hypoxia    Pneumonia due to COVID-19 virus 06/22/2020   Vitamin D deficiency 04/10/2020   Alkaline phosphatase elevation 09/04/2018   Type 2 diabetes mellitus with vascular disease ([place]) 08/19/2018   Diabetic neuropathy ([place]) 08/19/2018   Hypertensive urgency 04/10/2018   Fall 12/08/2017   Vascular dementia of acute onset without behavioral disturbance ([place]) 09/05/2017   Urge incontinence 09/05/2017   Decreased sensation of foot 09/04/2017   Onychomycosis of toenail 09/04/2017   Memory difficulties 08/06/2017   HLD (hyperlipidemia) 06/02/2017   CKD (chronic kidney disease), stage III ([place]) 06/02/2017   Stroke (cerebrum) ([place]) 05/14/2017   Type 2 diabetes mellitus with stage 3 chronic kidney disease, without long-term current use of insulin ([place]) 05/14/2017   Hypertension  05/14/2017    Past Medical History: Past Medical History:  Diagnosis Date   Diabetes mellitus without complication ([place])    HLD (hyperlipidemia) 06/02/2017   Hypertension     Stroke (cerebrum) ([place]) 05/14/2017   Urge incontinence 09/05/2017   Vascular dementia of acute onset without behavioral disturbance ([place]) 09/05/2017    Past Surgical History: History reviewed. No pertinent surgical history.  Social History: Social History   Tobacco Use   Smoking status: Never   Smokeless tobacco: Never  Vaping Use   Vaping Use: Never used  Substance Use Topics   Alcohol use: Yes    Alcohol/week: 2.0 standard drinks    Types: 1 Glasses of wine, 1 Shots of liquor per week    Comment: Patient occasionally drinks wine.  She talks about rum but her grandchildren say she does not buy any   Drug use: Never    Please also refer to relevant sections of EMR.  Family History: Family History  Problem Relation Age of Onset   Hypertension Mother      Allergies and Medications: No Known Allergies No current facility-administered medications on file prior to encounter.   Current Outpatient Medications on File Prior to Encounter  Medication Sig Dispense Refill   acetaminophen (TYLENOL) 325 MG tablet Take 650 mg by mouth every 6 (six) hours as needed.     amLODipine (NORVASC) 5 MG tablet Take 1 tablet (5 mg total) by mouth at bedtime. 30 tablet 0   aspirin EC 81 MG tablet Take 81 mg by mouth at bedtime.     atorvastatin (LIPITOR) 20 MG tablet TAKE 1 TABLET BY MOUTH EVERY DAY (Patient taking differently: Take 20 mg by mouth daily.) 90 tablet 3   benzonatate (TESSALON) 100 MG capsule Take 1 capsule (100 mg total) by mouth 2 (two) times daily as needed for cough. (Patient taking differently: Take 100 mg by mouth 2 (two) times daily.) 20 capsule 0   candesartan (ATACAND) 8 MG tablet Take 8 mg by mouth at bedtime.     Cholecalciferol (VITAMIN D) 50 MCG (2000 UT) tablet Take 2,000 Units by mouth daily.     glucose blood test strip Use as instructed (Patient taking differently: 1 each by Other route See admin instructions. Use as instructed) 100 each 12   Lancet Devices (ONE TOUCH  DELICA LANCING DEV) MISC 1 Device by Does not apply route daily as needed. 1 each 3   OneTouch Delica Lancets 65K MISC 1 Units by Does not apply route daily as needed. 100 each 1   Blood Glucose Monitoring Suppl ([place]) w/Device KIT Check once daily in the morning before breakfast (Patient taking differently: 1 each by Other route daily.) 1 kit 3    Objective: BP 125/69   Pulse 81   Temp 98.2 F (36.8 C) (Oral)   Resp 17   Ht 5' (1.524 m)   Wt 132 lb 4.4 oz (60 kg)   SpO2 100%   BMI 25.83 kg/m   Exam:  General: Pleasantly demented lady lying comfortably in bed, NAD HEENT:C/AT, PERRL Cardiovascular: Regualar rate and rhythm Respiratory: No respiratory distress, clear to auscultation bilaterally, breathing normally on room air Gastrointestinal: soft, non-tender, nondistended, bowel sounds present MSK: Able to move all extremities (at baseline) Neuro: Unable to obtain, patient at her baseline, no focal neurological deficit noted Psych: Pleasant mood and affect Skin: warm, dry, LE edema or calf tenderness   Labs and Imaging: CBC BMET  Recent Labs  Lab  07/04/20 1242  WBC 6.3  HGB 11.2*  HCT 36.0  PLT 230   Recent Labs  Lab 07/04/20 1242  NA 140  K 3.8  CL 106  CO2 29  BUN 11  CREATININE 0.79  GLUCOSE 165*  CALCIUM 9.1       Dagar, Meredith Staggers, MD 07/04/2020, 3:35 PM PGY-1, [place] Intern pager: [phone], text pages welcome    FPTS Upper-Level Resident Addendum   I have independently interviewed and examined the patient. I have discussed the above with the original author and agree with their documentation. My edits for correction/addition/clarification are within the document. Please see also any attending notes.   Lyndee Hensen, DO PGY-2, [facility] 07/04/2020 3:42 PM  [place] Service pager: [phone] (text pages welcome through [place])

## 2020-07-04 NOTE — ED Notes (Signed)
Pt verbalizes understanding of discharge instructions. Opportunity for questions and answers were provided. Pt discharged from the ED home via [facility].

## 2020-07-04 NOTE — ED Provider Notes (Signed)
[facility] EMERGENCY DEPARTMENT Provider Note   CSN: [id number] Arrival date & time: 07/04/20  1129     History Chief Complaint  Patient presents with   Covid Positive    Mary Walters is a 87 y.o. female.  87 year old female with prior medical history as detailed below presents for evaluation.  Patient's daughter called EMS.  Daughter was concerned that the patient was having increased work of breathing and appeared to be in distress this morning.  She activated EMS for transport.  Upon arrival to the ED the patient is uncomfortable in appearance.  She is without respiratory distress.  She is not hypoxic.  Patient is pleasantly demented.  Level 5 caveat secondary to same.  The history is provided by the patient, medical records and a relative.  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   Urge incontinence 09/05/2017   Vascular dementia of acute onset without behavioral disturbance ([place]) 09/05/2017    Patient Active Problem List   Diagnosis Date Noted   Acute cognitive decline 07/03/2020   Hypoxia    Pneumonia due to COVID-19 virus 06/22/2020   Vitamin D deficiency 04/10/2020   Alkaline phosphatase elevation 09/04/2018   Type 2 diabetes mellitus with vascular disease ([place]) 08/19/2018   Diabetic neuropathy ([place]) 08/19/2018   Hypertensive urgency 04/10/2018   Fall 12/08/2017   Vascular dementia of acute onset without behavioral disturbance ([place]) 09/05/2017   Urge incontinence 09/05/2017   Decreased sensation of foot 09/04/2017   Onychomycosis of toenail 09/04/2017   Memory difficulties 08/06/2017   HLD (hyperlipidemia) 06/02/2017    CKD (chronic kidney disease), stage III ([place]) 06/02/2017   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 No pertinent surgical history.   OB History   No obstetric history on file.     Family History  Problem Relation Age of Onset   Hypertension Mother     Social History   Tobacco Use   Smoking status: Never   Smokeless tobacco: Never  Vaping Use   Vaping Use: Never used  Substance Use Topics   Alcohol use: Yes    Alcohol/week: 2.0 standard drinks    Types: 1 Glasses of wine, 1 Shots of liquor per week    Comment: Patient occasionally drinks wine.  She talks about rum but her grandchildren say she does not buy any   Drug use: Never    Home Medications Prior to Admission medications   Medication Sig Start Date End Date Taking? Authorizing Provider  amLODipine (NORVASC) 5 MG tablet Take 1 tablet (5 mg total) by mouth at bedtime. 06/27/20  Yes Autry-Lott, Naaman Plummer, DO  aspirin EC 81 MG tablet Take 81 mg by mouth at bedtime.   Yes [provider]  atorvastatin (LIPITOR) 20 MG tablet TAKE 1 TABLET BY MOUTH EVERY DAY Patient taking differently: Take 20 mg by mouth daily. 12/14/19  Yes Wilber Oliphant, MD  benzonatate (TESSALON) 100 MG capsule Take 1 capsule (100 mg total) by mouth 2 (two) times daily as needed for cough.  Patient taking differently: Take 100 mg by mouth 2 (two) times daily. 06/19/20  Yes Martyn Malay, MD  candesartan (ATACAND) 8 MG tablet Take 8 mg by mouth at bedtime.   Yes [provider]  Cholecalciferol (VITAMIN D) 50 MCG (2000 UT) tablet Take 2,000 Units by mouth daily.   Yes [provider]  acetaminophen (TYLENOL) 325 MG tablet Take 650 mg by mouth every 6 (six) hours as needed.    [provider]  Blood Glucose Monitoring Suppl ([place]) w/Device  KIT Check once daily in the morning before breakfast Patient taking differently: 1 each by Other route daily. 06/11/18   Martyn Malay, MD  glucose blood test strip Use as instructed 06/11/18   Martyn Malay, MD  Lancet Devices (ONE TOUCH DELICA LANCING DEV) MISC 1 Device by Does not apply route daily as needed. 06/02/17   Glenis Smoker, MD  OneTouch Delica Lancets 20U MISC 1 Units by Does not apply route daily as needed. 06/11/18   Martyn Malay, MD    Allergies    Patient has no known allergies.  Review of Systems   Review of Systems  Unable to perform ROS: Dementia   Physical Exam Updated Vital Signs BP 125/69   Pulse 81   Temp 98.2 F (36.8 C) (Oral)   Resp 17   Ht 5' (1.524 m)   Wt 60 kg   SpO2 100%   BMI 25.83 kg/m   Physical Exam Vitals and nursing note reviewed.  Constitutional:      General: She is not in acute distress.    Appearance: Normal appearance. She is well-developed.  HENT:     Head: Normocephalic and atraumatic.  Eyes:     Conjunctiva/sclera: Conjunctivae normal.     Pupils: Pupils are equal, round, and reactive to light.  Cardiovascular:     Rate and Rhythm: Normal rate and regular rhythm.     Heart sounds: Normal heart sounds.  Pulmonary:     Effort: Pulmonary effort is normal. No respiratory distress.     Breath sounds: Normal breath sounds.  Abdominal:     General: There is no distension.     Palpations: Abdomen is soft.     Tenderness: There is no abdominal tenderness.  Musculoskeletal:        General: No deformity. Normal range of motion.     Cervical back: Normal range of motion and neck supple.  Skin:    General: Skin is warm and dry.  Neurological:     General: No focal deficit present.     Mental Status: She is alert. Mental status is at baseline.    ED Results / Procedures / Treatments   Labs (all labs ordered are listed, but only abnormal results are displayed) Labs Reviewed  COMPREHENSIVE METABOLIC PANEL -  Abnormal; Notable for the following components:      Result Value   Glucose, Bld 165 (*)    Total Protein 6.3 (*)    Albumin 2.7 (*)    All other components within normal limits  CBC WITH DIFFERENTIAL/PLATELET - Abnormal; Notable for the following components:   Hemoglobin 11.2 (*)    All other components within normal limits  URINALYSIS, ROUTINE W REFLEX MICROSCOPIC    EKG EKG Interpretation  Date/Time:  Tuesday July 04 2020 12:03:12 EDT Ventricular Rate:  78 PR Interval:  170 QRS Duration: 73 QT Interval:  474 QTC Calculation: 540 R Axis:   -10 Text  Interpretation: Sinus rhythm Inferior infarct, old Consider anterior infarct Prolonged QT interval Confirmed by Dene Gentry [phone]) on 07/04/2020 1:05:54 PM  Radiology DG Chest Port 1 View  Result Date: 07/04/2020 CLINICAL DATA:  Worsened COVID-19 symptoms, shallow respirations, increased difficulty breathing, diabetes mellitus, hypertension EXAM: PORTABLE CHEST 1 VIEW COMPARISON:  Portable exam 1227 hours compared to 06/22/2020 FINDINGS: Enlargement of cardiac silhouette. Mediastinal contours and pulmonary vascularity normal. Bibasilar atelectasis. Improved pulmonary infiltrates since previous exam with residual at RIGHT base. No pleural effusion or pneumothorax. Bones demineralized. IMPRESSION: Enlargement of cardiac silhouette. Bibasilar atelectasis with interval improvement in pulmonary infiltrates. Electronically Signed   By: Lavonia Dana M.D.   On: 07/04/2020 12:56    Procedures Procedures   Medications Ordered in ED Medications - No data to display  ED Course  I have reviewed the triage vital signs and the nursing notes.  Pertinent labs & imaging results that were available during my care of the patient were reviewed by me and considered in my medical decision making (see chart for details).    MDM Rules/Calculators/A&P                          MDM  MSE complete  TIJANA WALDER was evaluated in Emergency  Department on 07/04/2020 for the symptoms described in the history of present illness. She was evaluated in the context of the global COVID-19 pandemic, which necessitated consideration that the patient might be at risk for infection with the SARS-CoV-2 virus that causes COVID-19. Institutional protocols and algorithms that pertain to the evaluation of patients at risk for COVID-19 are in a state of rapid change based on information released by regulatory bodies including the CDC and federal and state organizations. These policies and algorithms were followed during the patient's care in the ED.   Patient presented from home with reported episode of respiratory distress  On exam patient is comfortable.  Screening labs are without significant abnormality.  Patient's daughter is at bedside.  Patient is known to family medicine.  Patient's daughter requested family medicine be made aware of the case and evaluate the patient in the ED.  Patient seen by family medicine.  Patient and patient's daughter are appropriate for outpatient management.  No indication at this time for inpatient care.  Importance of close follow-up was stressed.  Strict return precautions given and understood.    Final Clinical Impression(s) / ED Diagnoses Final diagnoses:  Dementia without behavioral disturbance, unspecified dementia type [facility])    Rx / DC Orders ED Discharge Orders     None        Valarie Merino, MD 07/04/20 1510

## 2020-07-04 NOTE — Telephone Encounter (Signed)
Received call from family--called back to check in on patient. Patient is having labored breathing at home, EMS already in route. Agree with ED evaluation for symptoms.  Terisa Starr, MD  Family Medicine Teaching Service

## 2020-07-05 ENCOUNTER — Telehealth: Payer: Self-pay

## 2020-07-05 NOTE — Progress Notes (Signed)
[facility] ([facility])  Hospital Liaison RN note         Notified by TOC manager of patient/family request for [facility] Palliative services at home after discharge.              [facility] Palliative team will follow up with patient after discharge.         Please call with any hospice or palliative related questions.         Thank you for the opportunity to participate in this patient's care.     Chrislyn King, BSN, RN [facility] Hospital Liaison (listed on [facility] under Hospice/[facility])    [phone] (24h on call)    

## 2020-07-05 NOTE — Telephone Encounter (Signed)
Spoke with patient's DIL [name] and scheduled an in-person Palliative Consult for 07/27/20 @ 9AM.  COVID screening was negative. No pets in home. Patient lives with DIL. She is the caregiver.   Consent obtained; updated Outlook/Netsmart/Team List and Epic.   Family is aware they may be receiving a call from NP the day before or day of to confirm appointment.

## 2020-07-05 NOTE — Progress Notes (Signed)
07/05/2020 659 am TOC CM faxed referral to [facility] for Palliative Services. Will follow up with family on Palliative services. Isidoro Donning RN CCM, [facility] ED TOC CM [phone]

## 2020-07-05 NOTE — Telephone Encounter (Signed)
Kelly from [facility] calling for OT verbal orders as follows:  2 time(s) weekly for 4 week(s), then 1 time(s) weekly for 2 week(s)  Verbal orders given per [facility] protocol  Veronda Prude, RN

## 2020-07-06 DIAGNOSIS — I69354 Hemiplegia and hemiparesis following cerebral infarction affecting left non-dominant side: Secondary | ICD-10-CM | POA: Diagnosis not present

## 2020-07-06 DIAGNOSIS — U071 COVID-19: Secondary | ICD-10-CM | POA: Diagnosis not present

## 2020-07-06 DIAGNOSIS — E1122 Type 2 diabetes mellitus with diabetic chronic kidney disease: Secondary | ICD-10-CM | POA: Diagnosis not present

## 2020-07-06 DIAGNOSIS — R69 Illness, unspecified: Secondary | ICD-10-CM | POA: Diagnosis not present

## 2020-07-06 DIAGNOSIS — E1159 Type 2 diabetes mellitus with other circulatory complications: Secondary | ICD-10-CM | POA: Diagnosis not present

## 2020-07-06 DIAGNOSIS — J1282 Pneumonia due to coronavirus disease 2019: Secondary | ICD-10-CM | POA: Diagnosis not present

## 2020-07-06 DIAGNOSIS — I10 Essential (primary) hypertension: Secondary | ICD-10-CM | POA: Diagnosis not present

## 2020-07-06 DIAGNOSIS — N183 Chronic kidney disease, stage 3 unspecified: Secondary | ICD-10-CM | POA: Diagnosis not present

## 2020-07-06 DIAGNOSIS — E1140 Type 2 diabetes mellitus with diabetic neuropathy, unspecified: Secondary | ICD-10-CM | POA: Diagnosis not present

## 2020-07-06 DIAGNOSIS — Z28310 Unvaccinated for covid-19: Secondary | ICD-10-CM | POA: Diagnosis not present

## 2020-07-07 DIAGNOSIS — E1122 Type 2 diabetes mellitus with diabetic chronic kidney disease: Secondary | ICD-10-CM | POA: Diagnosis not present

## 2020-07-07 DIAGNOSIS — I10 Essential (primary) hypertension: Secondary | ICD-10-CM | POA: Diagnosis not present

## 2020-07-07 DIAGNOSIS — U071 COVID-19: Secondary | ICD-10-CM | POA: Diagnosis not present

## 2020-07-07 DIAGNOSIS — E1140 Type 2 diabetes mellitus with diabetic neuropathy, unspecified: Secondary | ICD-10-CM | POA: Diagnosis not present

## 2020-07-07 DIAGNOSIS — N183 Chronic kidney disease, stage 3 unspecified: Secondary | ICD-10-CM | POA: Diagnosis not present

## 2020-07-07 DIAGNOSIS — E1159 Type 2 diabetes mellitus with other circulatory complications: Secondary | ICD-10-CM | POA: Diagnosis not present

## 2020-07-07 DIAGNOSIS — J1282 Pneumonia due to coronavirus disease 2019: Secondary | ICD-10-CM | POA: Diagnosis not present

## 2020-07-07 DIAGNOSIS — I69354 Hemiplegia and hemiparesis following cerebral infarction affecting left non-dominant side: Secondary | ICD-10-CM | POA: Diagnosis not present

## 2020-07-07 DIAGNOSIS — Z28310 Unvaccinated for covid-19: Secondary | ICD-10-CM | POA: Diagnosis not present

## 2020-07-07 DIAGNOSIS — R69 Illness, unspecified: Secondary | ICD-10-CM | POA: Diagnosis not present

## 2020-07-10 DIAGNOSIS — E1159 Type 2 diabetes mellitus with other circulatory complications: Secondary | ICD-10-CM | POA: Diagnosis not present

## 2020-07-10 DIAGNOSIS — R69 Illness, unspecified: Secondary | ICD-10-CM | POA: Diagnosis not present

## 2020-07-10 DIAGNOSIS — I69354 Hemiplegia and hemiparesis following cerebral infarction affecting left non-dominant side: Secondary | ICD-10-CM | POA: Diagnosis not present

## 2020-07-10 DIAGNOSIS — J1282 Pneumonia due to coronavirus disease 2019: Secondary | ICD-10-CM | POA: Diagnosis not present

## 2020-07-10 DIAGNOSIS — I10 Essential (primary) hypertension: Secondary | ICD-10-CM | POA: Diagnosis not present

## 2020-07-10 DIAGNOSIS — N183 Chronic kidney disease, stage 3 unspecified: Secondary | ICD-10-CM | POA: Diagnosis not present

## 2020-07-10 DIAGNOSIS — E1122 Type 2 diabetes mellitus with diabetic chronic kidney disease: Secondary | ICD-10-CM | POA: Diagnosis not present

## 2020-07-10 DIAGNOSIS — Z28310 Unvaccinated for covid-19: Secondary | ICD-10-CM | POA: Diagnosis not present

## 2020-07-10 DIAGNOSIS — E1140 Type 2 diabetes mellitus with diabetic neuropathy, unspecified: Secondary | ICD-10-CM | POA: Diagnosis not present

## 2020-07-10 DIAGNOSIS — U071 COVID-19: Secondary | ICD-10-CM | POA: Diagnosis not present

## 2020-07-11 DIAGNOSIS — U071 COVID-19: Secondary | ICD-10-CM | POA: Diagnosis not present

## 2020-07-11 DIAGNOSIS — E1122 Type 2 diabetes mellitus with diabetic chronic kidney disease: Secondary | ICD-10-CM | POA: Diagnosis not present

## 2020-07-11 DIAGNOSIS — J1282 Pneumonia due to coronavirus disease 2019: Secondary | ICD-10-CM | POA: Diagnosis not present

## 2020-07-11 DIAGNOSIS — E1159 Type 2 diabetes mellitus with other circulatory complications: Secondary | ICD-10-CM | POA: Diagnosis not present

## 2020-07-11 DIAGNOSIS — I69354 Hemiplegia and hemiparesis following cerebral infarction affecting left non-dominant side: Secondary | ICD-10-CM | POA: Diagnosis not present

## 2020-07-11 DIAGNOSIS — Z28310 Unvaccinated for covid-19: Secondary | ICD-10-CM | POA: Diagnosis not present

## 2020-07-11 DIAGNOSIS — N183 Chronic kidney disease, stage 3 unspecified: Secondary | ICD-10-CM | POA: Diagnosis not present

## 2020-07-11 DIAGNOSIS — I10 Essential (primary) hypertension: Secondary | ICD-10-CM | POA: Diagnosis not present

## 2020-07-11 DIAGNOSIS — E1140 Type 2 diabetes mellitus with diabetic neuropathy, unspecified: Secondary | ICD-10-CM | POA: Diagnosis not present

## 2020-07-11 DIAGNOSIS — R69 Illness, unspecified: Secondary | ICD-10-CM | POA: Diagnosis not present

## 2020-07-12 DIAGNOSIS — R69 Illness, unspecified: Secondary | ICD-10-CM | POA: Diagnosis not present

## 2020-07-12 DIAGNOSIS — E1159 Type 2 diabetes mellitus with other circulatory complications: Secondary | ICD-10-CM | POA: Diagnosis not present

## 2020-07-12 DIAGNOSIS — I69354 Hemiplegia and hemiparesis following cerebral infarction affecting left non-dominant side: Secondary | ICD-10-CM | POA: Diagnosis not present

## 2020-07-12 DIAGNOSIS — N183 Chronic kidney disease, stage 3 unspecified: Secondary | ICD-10-CM | POA: Diagnosis not present

## 2020-07-12 DIAGNOSIS — I10 Essential (primary) hypertension: Secondary | ICD-10-CM | POA: Diagnosis not present

## 2020-07-12 DIAGNOSIS — E1140 Type 2 diabetes mellitus with diabetic neuropathy, unspecified: Secondary | ICD-10-CM | POA: Diagnosis not present

## 2020-07-12 DIAGNOSIS — J1282 Pneumonia due to coronavirus disease 2019: Secondary | ICD-10-CM | POA: Diagnosis not present

## 2020-07-12 DIAGNOSIS — U071 COVID-19: Secondary | ICD-10-CM | POA: Diagnosis not present

## 2020-07-12 DIAGNOSIS — Z28310 Unvaccinated for covid-19: Secondary | ICD-10-CM | POA: Diagnosis not present

## 2020-07-12 DIAGNOSIS — E1122 Type 2 diabetes mellitus with diabetic chronic kidney disease: Secondary | ICD-10-CM | POA: Diagnosis not present

## 2020-07-13 DIAGNOSIS — R69 Illness, unspecified: Secondary | ICD-10-CM | POA: Diagnosis not present

## 2020-07-13 DIAGNOSIS — I10 Essential (primary) hypertension: Secondary | ICD-10-CM | POA: Diagnosis not present

## 2020-07-13 DIAGNOSIS — E1159 Type 2 diabetes mellitus with other circulatory complications: Secondary | ICD-10-CM | POA: Diagnosis not present

## 2020-07-13 DIAGNOSIS — J1282 Pneumonia due to coronavirus disease 2019: Secondary | ICD-10-CM | POA: Diagnosis not present

## 2020-07-13 DIAGNOSIS — E1140 Type 2 diabetes mellitus with diabetic neuropathy, unspecified: Secondary | ICD-10-CM | POA: Diagnosis not present

## 2020-07-13 DIAGNOSIS — I69354 Hemiplegia and hemiparesis following cerebral infarction affecting left non-dominant side: Secondary | ICD-10-CM | POA: Diagnosis not present

## 2020-07-13 DIAGNOSIS — E1122 Type 2 diabetes mellitus with diabetic chronic kidney disease: Secondary | ICD-10-CM | POA: Diagnosis not present

## 2020-07-13 DIAGNOSIS — N183 Chronic kidney disease, stage 3 unspecified: Secondary | ICD-10-CM | POA: Diagnosis not present

## 2020-07-13 DIAGNOSIS — U071 COVID-19: Secondary | ICD-10-CM | POA: Diagnosis not present

## 2020-07-13 DIAGNOSIS — Z28310 Unvaccinated for covid-19: Secondary | ICD-10-CM | POA: Diagnosis not present

## 2020-07-19 DIAGNOSIS — U071 COVID-19: Secondary | ICD-10-CM | POA: Diagnosis not present

## 2020-07-19 DIAGNOSIS — Z28310 Unvaccinated for covid-19: Secondary | ICD-10-CM | POA: Diagnosis not present

## 2020-07-19 DIAGNOSIS — J1282 Pneumonia due to coronavirus disease 2019: Secondary | ICD-10-CM | POA: Diagnosis not present

## 2020-07-19 DIAGNOSIS — R69 Illness, unspecified: Secondary | ICD-10-CM | POA: Diagnosis not present

## 2020-07-19 DIAGNOSIS — E1159 Type 2 diabetes mellitus with other circulatory complications: Secondary | ICD-10-CM | POA: Diagnosis not present

## 2020-07-19 DIAGNOSIS — E1122 Type 2 diabetes mellitus with diabetic chronic kidney disease: Secondary | ICD-10-CM | POA: Diagnosis not present

## 2020-07-19 DIAGNOSIS — N183 Chronic kidney disease, stage 3 unspecified: Secondary | ICD-10-CM | POA: Diagnosis not present

## 2020-07-19 DIAGNOSIS — I69354 Hemiplegia and hemiparesis following cerebral infarction affecting left non-dominant side: Secondary | ICD-10-CM | POA: Diagnosis not present

## 2020-07-19 DIAGNOSIS — I10 Essential (primary) hypertension: Secondary | ICD-10-CM | POA: Diagnosis not present

## 2020-07-19 DIAGNOSIS — E1140 Type 2 diabetes mellitus with diabetic neuropathy, unspecified: Secondary | ICD-10-CM | POA: Diagnosis not present

## 2020-07-20 DIAGNOSIS — N183 Chronic kidney disease, stage 3 unspecified: Secondary | ICD-10-CM | POA: Diagnosis not present

## 2020-07-20 DIAGNOSIS — E1159 Type 2 diabetes mellitus with other circulatory complications: Secondary | ICD-10-CM | POA: Diagnosis not present

## 2020-07-20 DIAGNOSIS — U071 COVID-19: Secondary | ICD-10-CM | POA: Diagnosis not present

## 2020-07-20 DIAGNOSIS — I10 Essential (primary) hypertension: Secondary | ICD-10-CM | POA: Diagnosis not present

## 2020-07-20 DIAGNOSIS — Z28310 Unvaccinated for covid-19: Secondary | ICD-10-CM | POA: Diagnosis not present

## 2020-07-20 DIAGNOSIS — E1140 Type 2 diabetes mellitus with diabetic neuropathy, unspecified: Secondary | ICD-10-CM | POA: Diagnosis not present

## 2020-07-20 DIAGNOSIS — R69 Illness, unspecified: Secondary | ICD-10-CM | POA: Diagnosis not present

## 2020-07-20 DIAGNOSIS — I69354 Hemiplegia and hemiparesis following cerebral infarction affecting left non-dominant side: Secondary | ICD-10-CM | POA: Diagnosis not present

## 2020-07-20 DIAGNOSIS — E1122 Type 2 diabetes mellitus with diabetic chronic kidney disease: Secondary | ICD-10-CM | POA: Diagnosis not present

## 2020-07-20 DIAGNOSIS — J1282 Pneumonia due to coronavirus disease 2019: Secondary | ICD-10-CM | POA: Diagnosis not present

## 2020-07-22 DIAGNOSIS — I69354 Hemiplegia and hemiparesis following cerebral infarction affecting left non-dominant side: Secondary | ICD-10-CM | POA: Diagnosis not present

## 2020-07-22 DIAGNOSIS — N183 Chronic kidney disease, stage 3 unspecified: Secondary | ICD-10-CM | POA: Diagnosis not present

## 2020-07-22 DIAGNOSIS — Z28310 Unvaccinated for covid-19: Secondary | ICD-10-CM | POA: Diagnosis not present

## 2020-07-22 DIAGNOSIS — E1159 Type 2 diabetes mellitus with other circulatory complications: Secondary | ICD-10-CM | POA: Diagnosis not present

## 2020-07-22 DIAGNOSIS — E1122 Type 2 diabetes mellitus with diabetic chronic kidney disease: Secondary | ICD-10-CM | POA: Diagnosis not present

## 2020-07-22 DIAGNOSIS — E1140 Type 2 diabetes mellitus with diabetic neuropathy, unspecified: Secondary | ICD-10-CM | POA: Diagnosis not present

## 2020-07-22 DIAGNOSIS — I10 Essential (primary) hypertension: Secondary | ICD-10-CM | POA: Diagnosis not present

## 2020-07-22 DIAGNOSIS — J1282 Pneumonia due to coronavirus disease 2019: Secondary | ICD-10-CM | POA: Diagnosis not present

## 2020-07-22 DIAGNOSIS — R69 Illness, unspecified: Secondary | ICD-10-CM | POA: Diagnosis not present

## 2020-07-22 DIAGNOSIS — U071 COVID-19: Secondary | ICD-10-CM | POA: Diagnosis not present

## 2020-07-25 ENCOUNTER — Other Ambulatory Visit: Payer: Self-pay | Admitting: Family Medicine

## 2020-07-25 DIAGNOSIS — E1159 Type 2 diabetes mellitus with other circulatory complications: Secondary | ICD-10-CM | POA: Diagnosis not present

## 2020-07-25 DIAGNOSIS — I69354 Hemiplegia and hemiparesis following cerebral infarction affecting left non-dominant side: Secondary | ICD-10-CM | POA: Diagnosis not present

## 2020-07-25 DIAGNOSIS — E1140 Type 2 diabetes mellitus with diabetic neuropathy, unspecified: Secondary | ICD-10-CM | POA: Diagnosis not present

## 2020-07-25 DIAGNOSIS — I10 Essential (primary) hypertension: Secondary | ICD-10-CM | POA: Diagnosis not present

## 2020-07-25 DIAGNOSIS — N183 Chronic kidney disease, stage 3 unspecified: Secondary | ICD-10-CM | POA: Diagnosis not present

## 2020-07-25 DIAGNOSIS — Z28310 Unvaccinated for covid-19: Secondary | ICD-10-CM | POA: Diagnosis not present

## 2020-07-25 DIAGNOSIS — E1122 Type 2 diabetes mellitus with diabetic chronic kidney disease: Secondary | ICD-10-CM | POA: Diagnosis not present

## 2020-07-25 DIAGNOSIS — U071 COVID-19: Secondary | ICD-10-CM | POA: Diagnosis not present

## 2020-07-25 DIAGNOSIS — R69 Illness, unspecified: Secondary | ICD-10-CM | POA: Diagnosis not present

## 2020-07-25 DIAGNOSIS — J1282 Pneumonia due to coronavirus disease 2019: Secondary | ICD-10-CM | POA: Diagnosis not present

## 2020-07-27 ENCOUNTER — Other Ambulatory Visit: Payer: Medicare HMO | Admitting: Hospice

## 2020-07-27 ENCOUNTER — Other Ambulatory Visit: Payer: Self-pay

## 2020-07-27 DIAGNOSIS — E1140 Type 2 diabetes mellitus with diabetic neuropathy, unspecified: Secondary | ICD-10-CM | POA: Diagnosis not present

## 2020-07-27 DIAGNOSIS — N1831 Chronic kidney disease, stage 3a: Secondary | ICD-10-CM

## 2020-07-27 DIAGNOSIS — I69354 Hemiplegia and hemiparesis following cerebral infarction affecting left non-dominant side: Secondary | ICD-10-CM | POA: Diagnosis not present

## 2020-07-27 DIAGNOSIS — R0902 Hypoxemia: Secondary | ICD-10-CM | POA: Diagnosis not present

## 2020-07-27 DIAGNOSIS — R208 Other disturbances of skin sensation: Secondary | ICD-10-CM | POA: Diagnosis not present

## 2020-07-27 DIAGNOSIS — I10 Essential (primary) hypertension: Secondary | ICD-10-CM | POA: Diagnosis not present

## 2020-07-27 DIAGNOSIS — Z515 Encounter for palliative care: Secondary | ICD-10-CM | POA: Diagnosis not present

## 2020-07-27 DIAGNOSIS — E1122 Type 2 diabetes mellitus with diabetic chronic kidney disease: Secondary | ICD-10-CM | POA: Diagnosis not present

## 2020-07-27 DIAGNOSIS — Z28310 Unvaccinated for covid-19: Secondary | ICD-10-CM | POA: Diagnosis not present

## 2020-07-27 DIAGNOSIS — E1159 Type 2 diabetes mellitus with other circulatory complications: Secondary | ICD-10-CM | POA: Diagnosis not present

## 2020-07-27 DIAGNOSIS — R69 Illness, unspecified: Secondary | ICD-10-CM | POA: Diagnosis not present

## 2020-07-27 DIAGNOSIS — F0150 Vascular dementia without behavioral disturbance: Secondary | ICD-10-CM

## 2020-07-27 DIAGNOSIS — U071 COVID-19: Secondary | ICD-10-CM | POA: Diagnosis not present

## 2020-07-27 DIAGNOSIS — J1282 Pneumonia due to coronavirus disease 2019: Secondary | ICD-10-CM | POA: Diagnosis not present

## 2020-07-27 DIAGNOSIS — N183 Chronic kidney disease, stage 3 unspecified: Secondary | ICD-10-CM | POA: Diagnosis not present

## 2020-07-27 NOTE — Progress Notes (Signed)
[facility] Consult Note Telephone: [phone]  Fax: [phone]  PATIENT NAME: Mary Walters [address] [place] [address] [phone] (home) [phone] (work) DOB: [birth year] MRN: [id number]  PRIMARY CARE PROVIDER:    Ezequiel Essex, MD,  [place] [address] [phone]  REFERRING PROVIDER:   Dr. Talbert Cage  RESPONSIBLE PARTY:   Donna Christen - son  Emergency contact: Sunday Spillers: daughter in law [phone] c - best to call Grand dauhter_ Deseree works at [employer]     Name [place] Daughter [phone]     Lajuan, [place] Granddaughter   [phone]   Aneyah, [place]   [phone]       I met face to face with patient and family at home. Palliative Care was asked to follow this patient by consultation request of Dr. Talbert Cage to address advance care planning, complex medical decision making and goals of care clarification. This is the initial visit.    ASSESSMENT AND / RECOMMENDATIONS:   Advance Care Planning: Our advance care planning conversation included a discussion about:    The value and importance of advance care planning  Difference between Hospice and Palliative care Exploration of goals of care in the event of a sudden injury or illness  Identification and preparation of a healthcare agent  Review and updating or creation of an  advance directive document . Decision not to resuscitate or to de-escalate disease focused treatments due to poor prognosis.  CODE STATUS: Discussion on ramifications and implications of CODE STATUS.  Patient and Sunday Spillers affirmed that patient is a DO NOT RESUSCITATE.  NP signed DNR form for patient to keep at home; same document uploaded to epic.  Goals of Care: Goals include to maximize quality of life and symptom management  I spent 46 minutes providing this initial consultation.  More than 50% of the time in this consultation was spent on counseling patient and coordinating communication. --------------------------------------------------------------------------------------------------------------------------------------  Symptom Management/Plan: Dementia: worsening memory loss and confusion. FAST 7A with limited language and mostly repetitive. Encourage reminiscence,  Word search/puzzles.  Fall and safety precautions discussed.  Continue ongoing supportive care Weakness: OT/PT is ongoing for strengthening and gait training.  Balance of rest and performance activity. Type 2 DM: managed with diet. Check A1c at PCP's office.  Routine CBC BMP Dry skin: Encouraged use of moisturizers and emollients. Follow up: Palliative care will continue to follow for complex medical decision making, advance care planning, and clarification of goals. Return 6 weeks or prn.Encouraged to call provider sooner with any concerns.  Family /Caregiver/Community Supports: Patient lives at home with son and his family. His spouse Sunday Spillers is the primary caregiver. Strong family support system identified.   HOSPICE ELIGIBILITY/DIAGNOSIS: TBD  Chief Complaint: Initial Palliative care visit  HISTORY OF PRESENT ILLNESS:  Mary Walters is a 87 y.o. year old female  with multiple medical conditions including vascular dementia, advanced, with worsening memory loss in line with dementia disease trajectory.  Dementia impairs her independence and quality of life.  Associated memory loss is worse as the day progresses; cueing is sometimes helpful but not always. History of stroke in,  2019, Type 2 DM, HTN. Patient denies pain/discomfort, no short of breath.  History obtained from review of EMR, discussion with primary team, caregiver, family and/or Ms. Kurtenbach.  Review and summarization of Epic records shows history from other than patient. Rest of 10 point ROS asked and negative.  Review of lab  tests/diagnostics   Results for BRAELEE, HERRLE (MRN [id number]) as of 07/27/2020 09:52  Ref. Range 07/04/2020 12:42  Sodium Latest Ref Range: 135 - 145 mmol/L 140  Potassium Latest Ref Range: 3.5 - 5.1 mmol/L 3.8  Chloride Latest Ref Range: 98 - 111 mmol/L 106  CO2 Latest Ref Range: 22 - 32 mmol/L 29  Glucose Latest Ref Range: 70 - 99 mg/dL 165 (H)  BUN Latest Ref Range: 8 - 23 mg/dL 11  Creatinine Latest Ref Range: 0.44 - 1.00 mg/dL 0.79  Calcium Latest Ref Range: 8.9 - 10.3 mg/dL 9.1  Anion gap Latest Ref Range: 5 - 15  5  Alkaline Phosphatase Latest Ref Range: 38 - 126 U/L 68  Albumin Latest Ref Range: 3.5 - 5.0 g/dL 2.7 (L)  AST Latest Ref Range: 15 - 41 U/L 22  ALT Latest Ref Range: 0 - 44 U/L 23  Total Protein Latest Ref Range: 6.5 - 8.1 g/dL 6.3 (L)  Total Bilirubin Latest Ref Range: 0.3 - 1.2 mg/dL 0.8  GFR, Estimated Latest Ref Range: >60 mL/min >60    ROS General: NAD EYES: denies vision changes ENMT: denies dysphagia Cardiovascular: denies chest pain/discomfort Pulmonary: denies cough, denies SOB Abdomen: endorses good appetite, denies constipation/diarrhea GU: denies dysuria, urinary frequency MSK:  endorses weakness,  no falls reported Skin: denies rashes or wounds Neurological: denies pain, denies insomnia Psych: Endorses positive mood Heme/lymph/immuno: denies bruises, abnormal bleeding  Physical Exam: Constitutional: NAD General: Well groomed, cooperative EYES: anicteric sclera, lids intact, no discharge  ENMT: Moist mucous membrane CV: S1 S2, RRR, no LE edema Pulmonary: LCTA, no increased work of breathing, no cough, Abdomen: active BS + 4 quadrants, soft and non tender GU: no suprapubic tenderness MSK: weakness, sarcopenia, limited ROM Skin:  dry skin, no rashes or wounds on visible skin Neuro:  weakness, otherwise non focal, memory loss Psych: non-anxious affect Hem/lymph/immuno: no widespread bruising   PAST MEDICAL HISTORY:  Active Ambulatory  Problems    Diagnosis Date Noted   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   Type 2 diabetes mellitus with stage 3 chronic kidney disease, without long-term current use of insulin ([facility]) 05/14/2017   Hypertension 05/14/2017   HLD (hyperlipidemia) 06/02/2017   CKD (chronic kidney disease), stage III ([place]) 06/02/2017   Memory difficulties 08/06/2017   Decreased sensation of foot 09/04/2017   Onychomycosis of toenail 09/04/2017   Vascular dementia of acute onset without behavioral disturbance ([place]) 09/05/2017   Urge incontinence 09/05/2017   Fall 12/08/2017   Hypertensive urgency 04/10/2018   Type 2 diabetes mellitus with vascular disease ([place]) 08/19/2018   Diabetic neuropathy ([facility]) 08/19/2018   Alkaline phosphatase elevation 09/04/2018   Vitamin D deficiency 04/10/2020   COVID-19 06/22/2020   Hypoxia    Acute cognitive decline 07/03/2020   Dementia without behavioral disturbance ([place])    Resolved Ambulatory Problems    Diagnosis Date Noted   Hypokalemia 25/95/6387   Acute metabolic encephalopathy 56/43/3295   Left-sided weakness    Past Medical History:  Diagnosis Date   Diabetes mellitus without complication ([place])     SOCIAL HX:  Social History   Tobacco Use   Smoking status: Never   Smokeless tobacco: Never  Substance Use Topics   Alcohol use: Yes    Alcohol/week: 2.0 standard drinks    Types: 1 Glasses of wine, 1 Shots of liquor per week    Comment: Patient occasionally drinks wine.  She talks about rum but her grandchildren say she does  not buy any     FAMILY HX:  Family History  Problem Relation Age of Onset   Hypertension Mother       ALLERGIES: No Known Allergies    PERTINENT MEDICATIONS:  Outpatient Encounter Medications as of 07/27/2020  Medication Sig   acetaminophen (TYLENOL) 325 MG tablet Take 650 mg by mouth every 6 (six) hours as needed.   amLODipine (NORVASC) 5 MG tablet TAKE 1 TABLET BY MOUTH EVERYDAY AT BEDTIME   aspirin EC 81 MG tablet Take 81 mg  by mouth at bedtime.   atorvastatin (LIPITOR) 20 MG tablet TAKE 1 TABLET BY MOUTH EVERY DAY (Patient taking differently: Take 20 mg by mouth daily.)   benzonatate (TESSALON) 100 MG capsule Take 1 capsule (100 mg total) by mouth 2 (two) times daily as needed for cough. (Patient taking differently: Take 100 mg by mouth 2 (two) times daily.)   Blood Glucose Monitoring Suppl (ONETOUCH VERIO FLEX SYSTEM) w/Device KIT Check once daily in the morning before breakfast (Patient taking differently: 1 each by Other route daily.)   candesartan (ATACAND) 8 MG tablet Take 8 mg by mouth at bedtime.   Cholecalciferol (VITAMIN D) 50 MCG (2000 UT) tablet Take 2,000 Units by mouth daily.   glucose blood test strip Use as instructed (Patient taking differently: 1 each by Other route See admin instructions. Use as instructed)   Lancet Devices (ONE TOUCH DELICA LANCING DEV) MISC 1 Device by Does not apply route daily as needed.   OneTouch Delica Lancets 09W MISC 1 Units by Does not apply route daily as needed.   No facility-administered encounter medications on file as of 07/27/2020.   Thank you for the opportunity to participate in the care of Ms. Gierke.  The palliative care team will continue to follow. Please call our office at [phone] if we can be of additional assistance.   Note: Portions of this note were generated with [employer]. Dictation errors may occur despite best attempts at proofreading.  Teodoro Spray, NP

## 2020-07-30 DIAGNOSIS — R208 Other disturbances of skin sensation: Secondary | ICD-10-CM | POA: Diagnosis not present

## 2020-07-30 DIAGNOSIS — J1282 Pneumonia due to coronavirus disease 2019: Secondary | ICD-10-CM | POA: Diagnosis not present

## 2020-07-30 DIAGNOSIS — R0902 Hypoxemia: Secondary | ICD-10-CM | POA: Diagnosis not present

## 2020-07-30 DIAGNOSIS — E1140 Type 2 diabetes mellitus with diabetic neuropathy, unspecified: Secondary | ICD-10-CM | POA: Diagnosis not present

## 2020-07-30 DIAGNOSIS — U071 COVID-19: Secondary | ICD-10-CM | POA: Diagnosis not present

## 2020-08-02 DIAGNOSIS — Z28310 Unvaccinated for covid-19: Secondary | ICD-10-CM | POA: Diagnosis not present

## 2020-08-02 DIAGNOSIS — E1159 Type 2 diabetes mellitus with other circulatory complications: Secondary | ICD-10-CM | POA: Diagnosis not present

## 2020-08-02 DIAGNOSIS — I10 Essential (primary) hypertension: Secondary | ICD-10-CM | POA: Diagnosis not present

## 2020-08-02 DIAGNOSIS — U071 COVID-19: Secondary | ICD-10-CM | POA: Diagnosis not present

## 2020-08-02 DIAGNOSIS — J1282 Pneumonia due to coronavirus disease 2019: Secondary | ICD-10-CM | POA: Diagnosis not present

## 2020-08-02 DIAGNOSIS — I69354 Hemiplegia and hemiparesis following cerebral infarction affecting left non-dominant side: Secondary | ICD-10-CM | POA: Diagnosis not present

## 2020-08-02 DIAGNOSIS — E1122 Type 2 diabetes mellitus with diabetic chronic kidney disease: Secondary | ICD-10-CM | POA: Diagnosis not present

## 2020-08-02 DIAGNOSIS — R69 Illness, unspecified: Secondary | ICD-10-CM | POA: Diagnosis not present

## 2020-08-02 DIAGNOSIS — N183 Chronic kidney disease, stage 3 unspecified: Secondary | ICD-10-CM | POA: Diagnosis not present

## 2020-08-02 DIAGNOSIS — E1140 Type 2 diabetes mellitus with diabetic neuropathy, unspecified: Secondary | ICD-10-CM | POA: Diagnosis not present

## 2020-08-03 DIAGNOSIS — I69354 Hemiplegia and hemiparesis following cerebral infarction affecting left non-dominant side: Secondary | ICD-10-CM | POA: Diagnosis not present

## 2020-08-03 DIAGNOSIS — E1159 Type 2 diabetes mellitus with other circulatory complications: Secondary | ICD-10-CM | POA: Diagnosis not present

## 2020-08-03 DIAGNOSIS — R69 Illness, unspecified: Secondary | ICD-10-CM | POA: Diagnosis not present

## 2020-08-03 DIAGNOSIS — Z28310 Unvaccinated for covid-19: Secondary | ICD-10-CM | POA: Diagnosis not present

## 2020-08-03 DIAGNOSIS — J1282 Pneumonia due to coronavirus disease 2019: Secondary | ICD-10-CM | POA: Diagnosis not present

## 2020-08-03 DIAGNOSIS — E1122 Type 2 diabetes mellitus with diabetic chronic kidney disease: Secondary | ICD-10-CM | POA: Diagnosis not present

## 2020-08-03 DIAGNOSIS — U071 COVID-19: Secondary | ICD-10-CM | POA: Diagnosis not present

## 2020-08-03 DIAGNOSIS — E1140 Type 2 diabetes mellitus with diabetic neuropathy, unspecified: Secondary | ICD-10-CM | POA: Diagnosis not present

## 2020-08-03 DIAGNOSIS — I10 Essential (primary) hypertension: Secondary | ICD-10-CM | POA: Diagnosis not present

## 2020-08-03 DIAGNOSIS — N183 Chronic kidney disease, stage 3 unspecified: Secondary | ICD-10-CM | POA: Diagnosis not present

## 2020-08-08 ENCOUNTER — Telehealth: Payer: Self-pay

## 2020-08-08 NOTE — Telephone Encounter (Signed)
Monique from [facility] [employer] calling for PT verbal orders as follows:  2 time(s) weekly for 2 week(s), then 1 time(s) weekly for 1 week(s)  Verbal orders given per [facility] protocol  Veronda Prude, RN

## 2020-08-09 DIAGNOSIS — E1140 Type 2 diabetes mellitus with diabetic neuropathy, unspecified: Secondary | ICD-10-CM | POA: Diagnosis not present

## 2020-08-09 DIAGNOSIS — U071 COVID-19: Secondary | ICD-10-CM | POA: Diagnosis not present

## 2020-08-09 DIAGNOSIS — E1122 Type 2 diabetes mellitus with diabetic chronic kidney disease: Secondary | ICD-10-CM | POA: Diagnosis not present

## 2020-08-09 DIAGNOSIS — I10 Essential (primary) hypertension: Secondary | ICD-10-CM | POA: Diagnosis not present

## 2020-08-09 DIAGNOSIS — I69354 Hemiplegia and hemiparesis following cerebral infarction affecting left non-dominant side: Secondary | ICD-10-CM | POA: Diagnosis not present

## 2020-08-09 DIAGNOSIS — N183 Chronic kidney disease, stage 3 unspecified: Secondary | ICD-10-CM | POA: Diagnosis not present

## 2020-08-09 DIAGNOSIS — E1159 Type 2 diabetes mellitus with other circulatory complications: Secondary | ICD-10-CM | POA: Diagnosis not present

## 2020-08-09 DIAGNOSIS — J1282 Pneumonia due to coronavirus disease 2019: Secondary | ICD-10-CM | POA: Diagnosis not present

## 2020-08-09 DIAGNOSIS — Z28310 Unvaccinated for covid-19: Secondary | ICD-10-CM | POA: Diagnosis not present

## 2020-08-09 DIAGNOSIS — R69 Illness, unspecified: Secondary | ICD-10-CM | POA: Diagnosis not present

## 2020-08-10 DIAGNOSIS — I69354 Hemiplegia and hemiparesis following cerebral infarction affecting left non-dominant side: Secondary | ICD-10-CM | POA: Diagnosis not present

## 2020-08-10 DIAGNOSIS — R69 Illness, unspecified: Secondary | ICD-10-CM | POA: Diagnosis not present

## 2020-08-10 DIAGNOSIS — E1140 Type 2 diabetes mellitus with diabetic neuropathy, unspecified: Secondary | ICD-10-CM | POA: Diagnosis not present

## 2020-08-10 DIAGNOSIS — Z28310 Unvaccinated for covid-19: Secondary | ICD-10-CM | POA: Diagnosis not present

## 2020-08-10 DIAGNOSIS — U071 COVID-19: Secondary | ICD-10-CM | POA: Diagnosis not present

## 2020-08-10 DIAGNOSIS — J1282 Pneumonia due to coronavirus disease 2019: Secondary | ICD-10-CM | POA: Diagnosis not present

## 2020-08-10 DIAGNOSIS — E1122 Type 2 diabetes mellitus with diabetic chronic kidney disease: Secondary | ICD-10-CM | POA: Diagnosis not present

## 2020-08-10 DIAGNOSIS — E1159 Type 2 diabetes mellitus with other circulatory complications: Secondary | ICD-10-CM | POA: Diagnosis not present

## 2020-08-10 DIAGNOSIS — I10 Essential (primary) hypertension: Secondary | ICD-10-CM | POA: Diagnosis not present

## 2020-08-10 DIAGNOSIS — N183 Chronic kidney disease, stage 3 unspecified: Secondary | ICD-10-CM | POA: Diagnosis not present

## 2020-08-12 DIAGNOSIS — I10 Essential (primary) hypertension: Secondary | ICD-10-CM | POA: Diagnosis not present

## 2020-08-12 DIAGNOSIS — U071 COVID-19: Secondary | ICD-10-CM | POA: Diagnosis not present

## 2020-08-12 DIAGNOSIS — E1159 Type 2 diabetes mellitus with other circulatory complications: Secondary | ICD-10-CM | POA: Diagnosis not present

## 2020-08-12 DIAGNOSIS — E1122 Type 2 diabetes mellitus with diabetic chronic kidney disease: Secondary | ICD-10-CM | POA: Diagnosis not present

## 2020-08-12 DIAGNOSIS — Z28310 Unvaccinated for covid-19: Secondary | ICD-10-CM | POA: Diagnosis not present

## 2020-08-12 DIAGNOSIS — I69354 Hemiplegia and hemiparesis following cerebral infarction affecting left non-dominant side: Secondary | ICD-10-CM | POA: Diagnosis not present

## 2020-08-12 DIAGNOSIS — R69 Illness, unspecified: Secondary | ICD-10-CM | POA: Diagnosis not present

## 2020-08-12 DIAGNOSIS — J1282 Pneumonia due to coronavirus disease 2019: Secondary | ICD-10-CM | POA: Diagnosis not present

## 2020-08-12 DIAGNOSIS — E1140 Type 2 diabetes mellitus with diabetic neuropathy, unspecified: Secondary | ICD-10-CM | POA: Diagnosis not present

## 2020-08-12 DIAGNOSIS — N183 Chronic kidney disease, stage 3 unspecified: Secondary | ICD-10-CM | POA: Diagnosis not present

## 2020-08-14 DIAGNOSIS — R69 Illness, unspecified: Secondary | ICD-10-CM | POA: Diagnosis not present

## 2020-08-14 DIAGNOSIS — U071 COVID-19: Secondary | ICD-10-CM | POA: Diagnosis not present

## 2020-08-14 DIAGNOSIS — E1159 Type 2 diabetes mellitus with other circulatory complications: Secondary | ICD-10-CM | POA: Diagnosis not present

## 2020-08-14 DIAGNOSIS — N183 Chronic kidney disease, stage 3 unspecified: Secondary | ICD-10-CM | POA: Diagnosis not present

## 2020-08-14 DIAGNOSIS — E1140 Type 2 diabetes mellitus with diabetic neuropathy, unspecified: Secondary | ICD-10-CM | POA: Diagnosis not present

## 2020-08-14 DIAGNOSIS — I10 Essential (primary) hypertension: Secondary | ICD-10-CM | POA: Diagnosis not present

## 2020-08-14 DIAGNOSIS — I69354 Hemiplegia and hemiparesis following cerebral infarction affecting left non-dominant side: Secondary | ICD-10-CM | POA: Diagnosis not present

## 2020-08-14 DIAGNOSIS — E1122 Type 2 diabetes mellitus with diabetic chronic kidney disease: Secondary | ICD-10-CM | POA: Diagnosis not present

## 2020-08-14 DIAGNOSIS — Z28310 Unvaccinated for covid-19: Secondary | ICD-10-CM | POA: Diagnosis not present

## 2020-08-14 DIAGNOSIS — J1282 Pneumonia due to coronavirus disease 2019: Secondary | ICD-10-CM | POA: Diagnosis not present

## 2020-08-15 DIAGNOSIS — R69 Illness, unspecified: Secondary | ICD-10-CM | POA: Diagnosis not present

## 2020-08-15 DIAGNOSIS — I69354 Hemiplegia and hemiparesis following cerebral infarction affecting left non-dominant side: Secondary | ICD-10-CM | POA: Diagnosis not present

## 2020-08-15 DIAGNOSIS — E1159 Type 2 diabetes mellitus with other circulatory complications: Secondary | ICD-10-CM | POA: Diagnosis not present

## 2020-08-15 DIAGNOSIS — E1122 Type 2 diabetes mellitus with diabetic chronic kidney disease: Secondary | ICD-10-CM | POA: Diagnosis not present

## 2020-08-15 DIAGNOSIS — E1140 Type 2 diabetes mellitus with diabetic neuropathy, unspecified: Secondary | ICD-10-CM | POA: Diagnosis not present

## 2020-08-15 DIAGNOSIS — Z28310 Unvaccinated for covid-19: Secondary | ICD-10-CM | POA: Diagnosis not present

## 2020-08-15 DIAGNOSIS — U071 COVID-19: Secondary | ICD-10-CM | POA: Diagnosis not present

## 2020-08-15 DIAGNOSIS — J1282 Pneumonia due to coronavirus disease 2019: Secondary | ICD-10-CM | POA: Diagnosis not present

## 2020-08-15 DIAGNOSIS — I10 Essential (primary) hypertension: Secondary | ICD-10-CM | POA: Diagnosis not present

## 2020-08-15 DIAGNOSIS — N183 Chronic kidney disease, stage 3 unspecified: Secondary | ICD-10-CM | POA: Diagnosis not present

## 2020-08-18 DIAGNOSIS — E1159 Type 2 diabetes mellitus with other circulatory complications: Secondary | ICD-10-CM | POA: Diagnosis not present

## 2020-08-18 DIAGNOSIS — R69 Illness, unspecified: Secondary | ICD-10-CM | POA: Diagnosis not present

## 2020-08-18 DIAGNOSIS — Z28310 Unvaccinated for covid-19: Secondary | ICD-10-CM | POA: Diagnosis not present

## 2020-08-18 DIAGNOSIS — J1282 Pneumonia due to coronavirus disease 2019: Secondary | ICD-10-CM | POA: Diagnosis not present

## 2020-08-18 DIAGNOSIS — N183 Chronic kidney disease, stage 3 unspecified: Secondary | ICD-10-CM | POA: Diagnosis not present

## 2020-08-18 DIAGNOSIS — I69354 Hemiplegia and hemiparesis following cerebral infarction affecting left non-dominant side: Secondary | ICD-10-CM | POA: Diagnosis not present

## 2020-08-18 DIAGNOSIS — E1122 Type 2 diabetes mellitus with diabetic chronic kidney disease: Secondary | ICD-10-CM | POA: Diagnosis not present

## 2020-08-18 DIAGNOSIS — I10 Essential (primary) hypertension: Secondary | ICD-10-CM | POA: Diagnosis not present

## 2020-08-18 DIAGNOSIS — U071 COVID-19: Secondary | ICD-10-CM | POA: Diagnosis not present

## 2020-08-18 DIAGNOSIS — E1140 Type 2 diabetes mellitus with diabetic neuropathy, unspecified: Secondary | ICD-10-CM | POA: Diagnosis not present

## 2020-08-23 ENCOUNTER — Other Ambulatory Visit: Payer: Self-pay | Admitting: Family Medicine

## 2020-08-24 DIAGNOSIS — I69354 Hemiplegia and hemiparesis following cerebral infarction affecting left non-dominant side: Secondary | ICD-10-CM | POA: Diagnosis not present

## 2020-08-24 DIAGNOSIS — E1122 Type 2 diabetes mellitus with diabetic chronic kidney disease: Secondary | ICD-10-CM | POA: Diagnosis not present

## 2020-08-24 DIAGNOSIS — J1282 Pneumonia due to coronavirus disease 2019: Secondary | ICD-10-CM | POA: Diagnosis not present

## 2020-08-24 DIAGNOSIS — R69 Illness, unspecified: Secondary | ICD-10-CM | POA: Diagnosis not present

## 2020-08-24 DIAGNOSIS — E1159 Type 2 diabetes mellitus with other circulatory complications: Secondary | ICD-10-CM | POA: Diagnosis not present

## 2020-08-24 DIAGNOSIS — N183 Chronic kidney disease, stage 3 unspecified: Secondary | ICD-10-CM | POA: Diagnosis not present

## 2020-08-24 DIAGNOSIS — E1140 Type 2 diabetes mellitus with diabetic neuropathy, unspecified: Secondary | ICD-10-CM | POA: Diagnosis not present

## 2020-08-24 DIAGNOSIS — I10 Essential (primary) hypertension: Secondary | ICD-10-CM | POA: Diagnosis not present

## 2020-08-24 DIAGNOSIS — U071 COVID-19: Secondary | ICD-10-CM | POA: Diagnosis not present

## 2020-08-24 DIAGNOSIS — Z28310 Unvaccinated for covid-19: Secondary | ICD-10-CM | POA: Diagnosis not present

## 2020-08-27 DIAGNOSIS — E1140 Type 2 diabetes mellitus with diabetic neuropathy, unspecified: Secondary | ICD-10-CM | POA: Diagnosis not present

## 2020-08-27 DIAGNOSIS — R0902 Hypoxemia: Secondary | ICD-10-CM | POA: Diagnosis not present

## 2020-08-27 DIAGNOSIS — U071 COVID-19: Secondary | ICD-10-CM | POA: Diagnosis not present

## 2020-08-27 DIAGNOSIS — J1282 Pneumonia due to coronavirus disease 2019: Secondary | ICD-10-CM | POA: Diagnosis not present

## 2020-08-27 DIAGNOSIS — R208 Other disturbances of skin sensation: Secondary | ICD-10-CM | POA: Diagnosis not present

## 2020-08-30 DIAGNOSIS — R0902 Hypoxemia: Secondary | ICD-10-CM | POA: Diagnosis not present

## 2020-08-30 DIAGNOSIS — U071 COVID-19: Secondary | ICD-10-CM | POA: Diagnosis not present

## 2020-08-30 DIAGNOSIS — R208 Other disturbances of skin sensation: Secondary | ICD-10-CM | POA: Diagnosis not present

## 2020-08-30 DIAGNOSIS — J1282 Pneumonia due to coronavirus disease 2019: Secondary | ICD-10-CM | POA: Diagnosis not present

## 2020-08-30 DIAGNOSIS — E1140 Type 2 diabetes mellitus with diabetic neuropathy, unspecified: Secondary | ICD-10-CM | POA: Diagnosis not present

## 2020-09-01 ENCOUNTER — Other Ambulatory Visit: Payer: Self-pay | Admitting: Family Medicine

## 2020-09-01 DIAGNOSIS — E1159 Type 2 diabetes mellitus with other circulatory complications: Secondary | ICD-10-CM

## 2020-09-14 ENCOUNTER — Other Ambulatory Visit: Payer: Self-pay

## 2020-09-14 ENCOUNTER — Other Ambulatory Visit: Payer: Medicare HMO | Admitting: Hospice

## 2020-09-14 DIAGNOSIS — Z515 Encounter for palliative care: Secondary | ICD-10-CM

## 2020-09-14 DIAGNOSIS — L853 Xerosis cutis: Secondary | ICD-10-CM

## 2020-09-14 DIAGNOSIS — N1831 Chronic kidney disease, stage 3a: Secondary | ICD-10-CM

## 2020-09-14 DIAGNOSIS — F0390 Unspecified dementia without behavioral disturbance: Secondary | ICD-10-CM

## 2020-09-14 DIAGNOSIS — E1122 Type 2 diabetes mellitus with diabetic chronic kidney disease: Secondary | ICD-10-CM | POA: Diagnosis not present

## 2020-09-14 DIAGNOSIS — R531 Weakness: Secondary | ICD-10-CM

## 2020-09-14 DIAGNOSIS — R69 Illness, unspecified: Secondary | ICD-10-CM | POA: Diagnosis not present

## 2020-09-14 NOTE — Progress Notes (Signed)
Mary Walters Consult Note Telephone: [phone]  Fax: [phone]  PATIENT NAME: Mary Walters DOB: [birth year] MRN: [id number]  PRIMARY CARE PROVIDER:   Ezequiel Essex, MD Ezequiel Essex, MD [address] [place] [address]  REFERRING PROVIDER:  Dr. Talbert Cage  RESPONSIBLE PARTY:  Donna Christen - son  Emergency contact: Sunday Spillers: daughter in law [phone] c - best to call  Grand dauhter_ Deseree works at [employer]     Name Jefferson Daughter [phone]     Pollyann, Roa Granddaughter   [phone]   Kamala, Kolton   [phone]       Visit is to build trust and highlight Palliative Medicine as specialized medical care for people living with serious illness, aimed at facilitating better quality of life through symptoms relief, assisting with advance care planning and complex medical decision making. Sunday Spillers is at home with patient during visit. This is a follow up visit.  RECOMMENDATIONS/PLAN:   Advance Care Planning/Code Status: Patient is a Do Not Resuscitate.  Goals of Care: Goals of care include to maximize quality of life and symptom management.  Visit consisted of counseling and education dealing with the complex and emotionally intense issues of symptom management and palliative care in the setting of serious and potentially life-threatening illness. Palliative care team will continue to support patient, patient's family, and medical team.  Symptom management/Plan:  Dementia: ongoing memory loss and confusion in line with Dementia Disease trajectory. FAST 6D incontinent of bowel and bladder.  Encourage reminiscence,  Word search/puzzles.  Fall and safety precautions discussed.  Continue ongoing supportive care Weakness: Improved; patient recently completed OT/PT. Balance of rest and performance activity. Encouraged use of rolling walker, Fall  precautions.  Type 2 DM: managed with diet.  Reports no SS of hypoglycemia/hyperglycemia.  Check A1c at PCP's office.  Dry skin: Improving.  Continue to use moisturizers and emollients. Follow up: Palliative care will continue to follow for complex   Follow up: Palliative care will continue to follow for complex medical decision making, advance care planning, and clarification of goals. Return 6 weeks or prn. Encouraged to call provider sooner with any concerns.  CHIEF COMPLAINT: Palliative follow up  HISTORY OF PRESENT ILLNESS:  Mary Walters a 87 y.o. female with multiple medical problems including Dementia. Stroke in  2019, Type 2 DM, HTN,   Dry skin.  Patient denies pain/discomfort, no fall or hospitalization since last visit.  History obtained from review of EMR, discussion with primary team, family and/or patient. Records reviewed and summarized above. All 10 point systems reviewed and are negative except as documented in history of present illness above Review and summarization of Epic records shows history from other than patient.   Palliative Care was asked to follow this patient o help address complex decision making in the context of advance care planning and goals of care clarification.   PHYSICAL EXAM  General: In no acute distress, appropriately dressed Cardiovascular: regular rate and rhythm; no edema in BLE Pulmonary: no cough, no increased work of breathing, normal respiratory effort Abdomen: soft, non tender, no guarding, positive bowel sounds in all quadrants GU:  no suprapubic tenderness Eyes: Normal lids, no discharge ENMT: Moist mucous membranes Musculoskeletal:  weakness, sarcopenia, ambulatory with rolling walker Skin: Dry skin, no rash to visible skin, warm without cyanosis,  Psych: non-anxious affect Neurological: Weakness but otherwise non focal Heme/lymph/immuno: no bruises, no bleeding  PERTINENT MEDICATIONS:  Outpatient Encounter Medications as of  09/14/2020  Medication Sig   acetaminophen (TYLENOL) 325 MG tablet Take 650 mg by mouth every 6 (six) hours as needed.   amLODipine (NORVASC) 5 MG tablet TAKE 1 TABLET BY MOUTH EVERYDAY AT BEDTIME   aspirin EC 81 MG tablet Take 81 mg by mouth at bedtime.   atorvastatin (LIPITOR) 20 MG tablet TAKE 1 TABLET BY MOUTH EVERY DAY (Patient taking differently: Take 20 mg by mouth daily.)   benzonatate (TESSALON) 100 MG capsule Take 1 capsule (100 mg total) by mouth 2 (two) times daily as needed for cough. (Patient taking differently: Take 100 mg by mouth 2 (two) times daily.)   Blood Glucose Monitoring Suppl (ONETOUCH VERIO FLEX SYSTEM) w/Device KIT Check once daily in the morning before breakfast (Patient taking differently: 1 each by Other route daily.)   candesartan (ATACAND) 8 MG tablet TAKE 1 TABLET BY MOUTH AT BEDTIME   Cholecalciferol (VITAMIN D) 50 MCG (2000 UT) tablet Take 2,000 Units by mouth daily.   glucose blood test strip Use as instructed (Patient taking differently: 1 each by Other route See admin instructions. Use as instructed)   Lancet Devices (ONE TOUCH DELICA LANCING DEV) MISC 1 Device by Does not apply route daily as needed.   OneTouch Delica Lancets 03B MISC 1 Units by Does not apply route daily as needed.   No facility-administered encounter medications on file as of 09/14/2020.    HOSPICE ELIGIBILITY/DIAGNOSIS: TBD  PAST MEDICAL HISTORY:  Past Medical History:  Diagnosis Date   Diabetes mellitus without complication ([place])    HLD (hyperlipidemia) 06/02/2017   Hypertension    Stroke (cerebrum) ([place]) 05/14/2017   Urge incontinence 09/05/2017   Vascular dementia of acute onset without behavioral disturbance ([place]) 09/05/2017     ALLERGIES: No Known Allergies    I spent 60 minutes providing this consultation; this includes time spent with patient/family, chart review and documentation. More than 50% of the time in this consultation was spent on counseling and coordinating  communication   Thank you for the opportunity to participate in the care of Mary Walters Please call our office at [phone] if we can be of additional assistance.  Note: Portions of this note were generated with Lobbyist. Dictation errors may occur despite best attempts at proofreading.  Teodoro Spray, NP

## 2020-09-27 DIAGNOSIS — U071 COVID-19: Secondary | ICD-10-CM | POA: Diagnosis not present

## 2020-09-27 DIAGNOSIS — R0902 Hypoxemia: Secondary | ICD-10-CM | POA: Diagnosis not present

## 2020-09-27 DIAGNOSIS — J1282 Pneumonia due to coronavirus disease 2019: Secondary | ICD-10-CM | POA: Diagnosis not present

## 2020-09-27 DIAGNOSIS — R208 Other disturbances of skin sensation: Secondary | ICD-10-CM | POA: Diagnosis not present

## 2020-09-27 DIAGNOSIS — E1140 Type 2 diabetes mellitus with diabetic neuropathy, unspecified: Secondary | ICD-10-CM | POA: Diagnosis not present

## 2020-09-30 DIAGNOSIS — U071 COVID-19: Secondary | ICD-10-CM | POA: Diagnosis not present

## 2020-09-30 DIAGNOSIS — R0902 Hypoxemia: Secondary | ICD-10-CM | POA: Diagnosis not present

## 2020-09-30 DIAGNOSIS — R208 Other disturbances of skin sensation: Secondary | ICD-10-CM | POA: Diagnosis not present

## 2020-09-30 DIAGNOSIS — J1282 Pneumonia due to coronavirus disease 2019: Secondary | ICD-10-CM | POA: Diagnosis not present

## 2020-09-30 DIAGNOSIS — E1140 Type 2 diabetes mellitus with diabetic neuropathy, unspecified: Secondary | ICD-10-CM | POA: Diagnosis not present

## 2020-10-27 DIAGNOSIS — J1282 Pneumonia due to coronavirus disease 2019: Secondary | ICD-10-CM | POA: Diagnosis not present

## 2020-10-27 DIAGNOSIS — E1140 Type 2 diabetes mellitus with diabetic neuropathy, unspecified: Secondary | ICD-10-CM | POA: Diagnosis not present

## 2020-10-27 DIAGNOSIS — R0902 Hypoxemia: Secondary | ICD-10-CM | POA: Diagnosis not present

## 2020-10-27 DIAGNOSIS — U071 COVID-19: Secondary | ICD-10-CM | POA: Diagnosis not present

## 2020-10-27 DIAGNOSIS — R208 Other disturbances of skin sensation: Secondary | ICD-10-CM | POA: Diagnosis not present

## 2020-10-30 DIAGNOSIS — R208 Other disturbances of skin sensation: Secondary | ICD-10-CM | POA: Diagnosis not present

## 2020-10-30 DIAGNOSIS — R0902 Hypoxemia: Secondary | ICD-10-CM | POA: Diagnosis not present

## 2020-10-30 DIAGNOSIS — J1282 Pneumonia due to coronavirus disease 2019: Secondary | ICD-10-CM | POA: Diagnosis not present

## 2020-10-30 DIAGNOSIS — U071 COVID-19: Secondary | ICD-10-CM | POA: Diagnosis not present

## 2020-10-30 DIAGNOSIS — E1140 Type 2 diabetes mellitus with diabetic neuropathy, unspecified: Secondary | ICD-10-CM | POA: Diagnosis not present

## 2020-11-27 DIAGNOSIS — R208 Other disturbances of skin sensation: Secondary | ICD-10-CM | POA: Diagnosis not present

## 2020-11-27 DIAGNOSIS — J1282 Pneumonia due to coronavirus disease 2019: Secondary | ICD-10-CM | POA: Diagnosis not present

## 2020-11-27 DIAGNOSIS — E1140 Type 2 diabetes mellitus with diabetic neuropathy, unspecified: Secondary | ICD-10-CM | POA: Diagnosis not present

## 2020-11-27 DIAGNOSIS — R0902 Hypoxemia: Secondary | ICD-10-CM | POA: Diagnosis not present

## 2020-11-27 DIAGNOSIS — U071 COVID-19: Secondary | ICD-10-CM | POA: Diagnosis not present

## 2020-11-30 ENCOUNTER — Other Ambulatory Visit: Payer: Self-pay | Admitting: Family Medicine

## 2020-11-30 DIAGNOSIS — U071 COVID-19: Secondary | ICD-10-CM | POA: Diagnosis not present

## 2020-11-30 DIAGNOSIS — I152 Hypertension secondary to endocrine disorders: Secondary | ICD-10-CM

## 2020-11-30 DIAGNOSIS — E1140 Type 2 diabetes mellitus with diabetic neuropathy, unspecified: Secondary | ICD-10-CM | POA: Diagnosis not present

## 2020-11-30 DIAGNOSIS — J1282 Pneumonia due to coronavirus disease 2019: Secondary | ICD-10-CM | POA: Diagnosis not present

## 2020-11-30 DIAGNOSIS — R0902 Hypoxemia: Secondary | ICD-10-CM | POA: Diagnosis not present

## 2020-11-30 DIAGNOSIS — R208 Other disturbances of skin sensation: Secondary | ICD-10-CM | POA: Diagnosis not present

## 2020-12-01 ENCOUNTER — Other Ambulatory Visit: Payer: Self-pay | Admitting: *Deleted

## 2020-12-01 DIAGNOSIS — F0150 Vascular dementia without behavioral disturbance: Secondary | ICD-10-CM

## 2020-12-01 MED ORDER — ATORVASTATIN CALCIUM 20 MG PO TABS: 20.0000 mg | ORAL_TABLET | Freq: Every day | ORAL | 1 refills | Status: DC

## 2020-12-27 DIAGNOSIS — R0902 Hypoxemia: Secondary | ICD-10-CM | POA: Diagnosis not present

## 2020-12-27 DIAGNOSIS — E1140 Type 2 diabetes mellitus with diabetic neuropathy, unspecified: Secondary | ICD-10-CM | POA: Diagnosis not present

## 2020-12-27 DIAGNOSIS — R208 Other disturbances of skin sensation: Secondary | ICD-10-CM | POA: Diagnosis not present

## 2020-12-27 DIAGNOSIS — U071 COVID-19: Secondary | ICD-10-CM | POA: Diagnosis not present

## 2020-12-27 DIAGNOSIS — J1282 Pneumonia due to coronavirus disease 2019: Secondary | ICD-10-CM | POA: Diagnosis not present

## 2020-12-30 DIAGNOSIS — R0902 Hypoxemia: Secondary | ICD-10-CM | POA: Diagnosis not present

## 2020-12-30 DIAGNOSIS — E1140 Type 2 diabetes mellitus with diabetic neuropathy, unspecified: Secondary | ICD-10-CM | POA: Diagnosis not present

## 2020-12-30 DIAGNOSIS — U071 COVID-19: Secondary | ICD-10-CM | POA: Diagnosis not present

## 2020-12-30 DIAGNOSIS — J1282 Pneumonia due to coronavirus disease 2019: Secondary | ICD-10-CM | POA: Diagnosis not present

## 2020-12-30 DIAGNOSIS — R208 Other disturbances of skin sensation: Secondary | ICD-10-CM | POA: Diagnosis not present

## 2021-01-27 DIAGNOSIS — U071 COVID-19: Secondary | ICD-10-CM | POA: Diagnosis not present

## 2021-01-27 DIAGNOSIS — R0902 Hypoxemia: Secondary | ICD-10-CM | POA: Diagnosis not present

## 2021-01-27 DIAGNOSIS — J1282 Pneumonia due to coronavirus disease 2019: Secondary | ICD-10-CM | POA: Diagnosis not present

## 2021-01-27 DIAGNOSIS — R208 Other disturbances of skin sensation: Secondary | ICD-10-CM | POA: Diagnosis not present

## 2021-01-27 DIAGNOSIS — E1140 Type 2 diabetes mellitus with diabetic neuropathy, unspecified: Secondary | ICD-10-CM | POA: Diagnosis not present

## 2021-01-30 DIAGNOSIS — E1140 Type 2 diabetes mellitus with diabetic neuropathy, unspecified: Secondary | ICD-10-CM | POA: Diagnosis not present

## 2021-01-30 DIAGNOSIS — J1282 Pneumonia due to coronavirus disease 2019: Secondary | ICD-10-CM | POA: Diagnosis not present

## 2021-01-30 DIAGNOSIS — U071 COVID-19: Secondary | ICD-10-CM | POA: Diagnosis not present

## 2021-01-30 DIAGNOSIS — R208 Other disturbances of skin sensation: Secondary | ICD-10-CM | POA: Diagnosis not present

## 2021-01-30 DIAGNOSIS — R0902 Hypoxemia: Secondary | ICD-10-CM | POA: Diagnosis not present

## 2021-02-27 DIAGNOSIS — U071 COVID-19: Secondary | ICD-10-CM | POA: Diagnosis not present

## 2021-02-27 DIAGNOSIS — R0902 Hypoxemia: Secondary | ICD-10-CM | POA: Diagnosis not present

## 2021-02-27 DIAGNOSIS — J1282 Pneumonia due to coronavirus disease 2019: Secondary | ICD-10-CM | POA: Diagnosis not present

## 2021-02-27 DIAGNOSIS — R208 Other disturbances of skin sensation: Secondary | ICD-10-CM | POA: Diagnosis not present

## 2021-02-27 DIAGNOSIS — E1140 Type 2 diabetes mellitus with diabetic neuropathy, unspecified: Secondary | ICD-10-CM | POA: Diagnosis not present

## 2021-03-02 DIAGNOSIS — R208 Other disturbances of skin sensation: Secondary | ICD-10-CM | POA: Diagnosis not present

## 2021-03-02 DIAGNOSIS — E1140 Type 2 diabetes mellitus with diabetic neuropathy, unspecified: Secondary | ICD-10-CM | POA: Diagnosis not present

## 2021-03-02 DIAGNOSIS — R0902 Hypoxemia: Secondary | ICD-10-CM | POA: Diagnosis not present

## 2021-03-02 DIAGNOSIS — U071 COVID-19: Secondary | ICD-10-CM | POA: Diagnosis not present

## 2021-03-02 DIAGNOSIS — J1282 Pneumonia due to coronavirus disease 2019: Secondary | ICD-10-CM | POA: Diagnosis not present

## 2021-03-21 ENCOUNTER — Other Ambulatory Visit: Payer: Self-pay | Admitting: Family Medicine

## 2021-03-27 DIAGNOSIS — E1140 Type 2 diabetes mellitus with diabetic neuropathy, unspecified: Secondary | ICD-10-CM | POA: Diagnosis not present

## 2021-03-27 DIAGNOSIS — J1282 Pneumonia due to coronavirus disease 2019: Secondary | ICD-10-CM | POA: Diagnosis not present

## 2021-03-27 DIAGNOSIS — U071 COVID-19: Secondary | ICD-10-CM | POA: Diagnosis not present

## 2021-03-27 DIAGNOSIS — R0902 Hypoxemia: Secondary | ICD-10-CM | POA: Diagnosis not present

## 2021-03-30 DIAGNOSIS — E1140 Type 2 diabetes mellitus with diabetic neuropathy, unspecified: Secondary | ICD-10-CM | POA: Diagnosis not present

## 2021-03-30 DIAGNOSIS — R0902 Hypoxemia: Secondary | ICD-10-CM | POA: Diagnosis not present

## 2021-03-30 DIAGNOSIS — J1282 Pneumonia due to coronavirus disease 2019: Secondary | ICD-10-CM | POA: Diagnosis not present

## 2021-03-30 DIAGNOSIS — U071 COVID-19: Secondary | ICD-10-CM | POA: Diagnosis not present

## 2021-04-05 ENCOUNTER — Telehealth: Payer: Self-pay | Admitting: Hospice

## 2021-04-05 DIAGNOSIS — Z515 Encounter for palliative care: Secondary | ICD-10-CM

## 2021-04-05 NOTE — Telephone Encounter (Signed)
NP called patient/Mary Walters to check in on patient; left vm with call back number.  ?

## 2021-04-10 ENCOUNTER — Encounter: Payer: Self-pay | Admitting: Podiatry

## 2021-04-10 ENCOUNTER — Ambulatory Visit: Payer: Medicare HMO | Admitting: Podiatry

## 2021-04-10 ENCOUNTER — Other Ambulatory Visit: Payer: Self-pay

## 2021-04-10 DIAGNOSIS — B351 Tinea unguium: Secondary | ICD-10-CM

## 2021-04-10 DIAGNOSIS — M79674 Pain in right toe(s): Secondary | ICD-10-CM

## 2021-04-10 DIAGNOSIS — E1159 Type 2 diabetes mellitus with other circulatory complications: Secondary | ICD-10-CM

## 2021-04-10 DIAGNOSIS — N183 Chronic kidney disease, stage 3 unspecified: Secondary | ICD-10-CM | POA: Diagnosis not present

## 2021-04-10 DIAGNOSIS — M79675 Pain in left toe(s): Secondary | ICD-10-CM

## 2021-04-10 NOTE — Addendum Note (Signed)
Addended by: Helane Gunther on: 04/10/2021 03:40 PM ? ? Modules accepted: Level of Service ? ?

## 2021-04-10 NOTE — Progress Notes (Signed)
This patient returns to my office for at risk foot care.  This patient requires this care by a professional since this patient will be at risk due to having diabetes and chronic kidney disease.  This patient is unable to cut nails hierelf since the patient cannot reach her nails.These nails are painful walking and wearing shoes. Patient presents with her female caregiver.   This patient presents for at risk foot care today. ? ?General Appearance  Alert, conversant and in no acute stress. ? ?Vascular  Dorsalis pedis and posterior tibial  pulses are weakly  palpable  bilaterally.  Capillary return is within normal limits  bilaterally. Temperature is within normal limits  bilaterally. ? ?Neurologic  Senn-Weinstein monofilament wire test absent   bilaterally. Muscle power within normal limits bilaterally. ? ?Nails Thick disfigured discolored nails with subungual debris  from hallux to fifth toes bilaterally. No evidence of bacterial infection or drainage bilaterally. ? ?Orthopedic  No limitations of motion  feet .  No crepitus or effusions noted.  No bony pathology or digital deformities noted.  HAV  B/L. ? ?Skin  normotropic skin with no porokeratosis noted bilaterally.  No signs of infections or ulcers noted.    ? ?Onychomycosis  Pain in right toes  Pain in left toe ? ?Debridement of nails with nail nipper and then dremel tool.  She was instructed to return to the office due to her at risk problems. ? ? ?Helane Gunther DPM ?

## 2021-04-12 ENCOUNTER — Other Ambulatory Visit: Payer: Medicare HMO | Admitting: Hospice

## 2021-06-19 ENCOUNTER — Encounter: Payer: Self-pay | Admitting: *Deleted

## 2021-07-24 ENCOUNTER — Ambulatory Visit: Payer: Medicare HMO | Admitting: Podiatry

## 2021-09-04 ENCOUNTER — Ambulatory Visit: Payer: Medicare HMO | Admitting: Podiatry

## 2021-09-04 ENCOUNTER — Encounter: Payer: Self-pay | Admitting: Podiatry

## 2021-09-04 DIAGNOSIS — E1159 Type 2 diabetes mellitus with other circulatory complications: Secondary | ICD-10-CM

## 2021-09-04 DIAGNOSIS — N183 Chronic kidney disease, stage 3 unspecified: Secondary | ICD-10-CM

## 2021-09-04 NOTE — Progress Notes (Signed)
This patient returns to my office for at risk foot care.  This patient requires this care by a professional since this patient will be at risk due to having diabetes and chronic kidney disease.  This patient is unable to cut nails hierelf since the patient cannot reach her nails.These nails are painful walking and wearing shoes. Patient presents with her female  caregiver.   This patient presents for at risk foot care today.  General Appearance  Alert, conversant and in no acute stress.  Vascular  Dorsalis pedis and posterior tibial  pulses are weakly  palpable  bilaterally.  Capillary return is within normal limits  bilaterally. Temperature is within normal limits  bilaterally.  Neurologic  Senn-Weinstein monofilament wire test absent   bilaterally. Muscle power within normal limits bilaterally.  Nails Thick disfigured discolored nails with subungual debris  from hallux to fifth toes bilaterally. No evidence of bacterial infection or drainage bilaterally.  Orthopedic  No limitations of motion  feet .  No crepitus or effusions noted.  No bony pathology or digital deformities noted.  HAV  B/L.  Skin  normotropic skin with no porokeratosis noted bilaterally.  No signs of infections or ulcers noted.     Onychomycosis  Pain in right toes  Pain in left toe  Debridement of nails with nail nipper and then dremel tool.  She was instructed to return to the office due to her at risk problems.  RTC 3 months   Helane Gunther DPM

## 2021-10-30 ENCOUNTER — Other Ambulatory Visit: Payer: Self-pay | Admitting: Family Medicine

## 2021-10-30 DIAGNOSIS — E1159 Type 2 diabetes mellitus with other circulatory complications: Secondary | ICD-10-CM

## 2021-11-20 ENCOUNTER — Other Ambulatory Visit: Payer: Self-pay | Admitting: Family Medicine

## 2021-11-20 DIAGNOSIS — E1159 Type 2 diabetes mellitus with other circulatory complications: Secondary | ICD-10-CM

## 2021-12-06 ENCOUNTER — Other Ambulatory Visit: Payer: Self-pay | Admitting: Family Medicine

## 2021-12-06 DIAGNOSIS — E1159 Type 2 diabetes mellitus with other circulatory complications: Secondary | ICD-10-CM

## 2021-12-11 ENCOUNTER — Ambulatory Visit: Payer: Medicare HMO | Admitting: Podiatry

## 2021-12-24 ENCOUNTER — Encounter: Payer: Self-pay | Admitting: Family Medicine

## 2021-12-24 ENCOUNTER — Telehealth: Payer: Self-pay

## 2021-12-24 ENCOUNTER — Telehealth: Payer: Self-pay | Admitting: *Deleted

## 2021-12-24 ENCOUNTER — Ambulatory Visit (INDEPENDENT_AMBULATORY_CARE_PROVIDER_SITE_OTHER): Payer: Medicare HMO | Admitting: Family Medicine

## 2021-12-24 VITALS — BP 128/78 | HR 90 | Ht 60.0 in | Wt 150.0 lb

## 2021-12-24 DIAGNOSIS — E1159 Type 2 diabetes mellitus with other circulatory complications: Secondary | ICD-10-CM

## 2021-12-24 DIAGNOSIS — E1122 Type 2 diabetes mellitus with diabetic chronic kidney disease: Secondary | ICD-10-CM | POA: Diagnosis not present

## 2021-12-24 DIAGNOSIS — Z8673 Personal history of transient ischemic attack (TIA), and cerebral infarction without residual deficits: Secondary | ICD-10-CM

## 2021-12-24 DIAGNOSIS — M25511 Pain in right shoulder: Secondary | ICD-10-CM | POA: Diagnosis not present

## 2021-12-24 DIAGNOSIS — N183 Chronic kidney disease, stage 3 unspecified: Secondary | ICD-10-CM

## 2021-12-24 DIAGNOSIS — Z23 Encounter for immunization: Secondary | ICD-10-CM

## 2021-12-24 DIAGNOSIS — F0150 Vascular dementia without behavioral disturbance: Secondary | ICD-10-CM

## 2021-12-24 DIAGNOSIS — I10 Essential (primary) hypertension: Secondary | ICD-10-CM | POA: Diagnosis not present

## 2021-12-24 DIAGNOSIS — R32 Unspecified urinary incontinence: Secondary | ICD-10-CM | POA: Diagnosis not present

## 2021-12-24 DIAGNOSIS — E559 Vitamin D deficiency, unspecified: Secondary | ICD-10-CM

## 2021-12-24 DIAGNOSIS — N3941 Urge incontinence: Secondary | ICD-10-CM

## 2021-12-24 DIAGNOSIS — R69 Illness, unspecified: Secondary | ICD-10-CM | POA: Diagnosis not present

## 2021-12-24 MED ORDER — DICLOFENAC SODIUM 1 % EX GEL: 4.0000 g | Freq: Four times a day (QID) | CUTANEOUS | 3 refills | Status: DC

## 2021-12-24 NOTE — Progress Notes (Signed)
    SUBJECTIVE:   CHIEF COMPLAINT: right shoulder mobility, BP check HPI:   Mary Walters is a 88 y.o.  with history notable for vascular dementia, type 2 DM, HTN, and CVA with residual right sided weakness presenting for follow up. She is joined by her daughter in Social worker and 24/7 care giver Mary Walters.  Family provides exceptional care to the patient. The patient has her own room and hospital bed. She has urinary incontinence related to her vascular dementia and diabetes. Family uses incontinence pads and briefs which help with this. During daytime, moves to family room on sofa. Ambulates easily with walker. No falls at home. Weight is increased. She is doing well with her medications. Mary Walters monitors and administers medications. Family helps with meals, dressing, bathing, cleaning.   Only concern today is related to R shoulder. Since the patient's stroke, she has had less mobility of R shoulder, cannot abduct past 90 degrees. No new weakness, complaints of pain, or rash.   PERTINENT  PMH / PSH/Family/Social History : updated and reviewed  OBJECTIVE:   BP 128/78   Pulse 90   Ht 5' (1.524 m)   Wt 150 lb (68 kg)   SpO2 100%   BMI 29.29 kg/m   Today's weight:  Last Weight  Most recent update: 12/24/2021 10:12 AM    Weight  68 kg (150 lb)            Review of prior weights: Filed Weights   12/24/21 1009  Weight: 150 lb (68 kg)     Cardiac: Regular rate and rhythm. Normal S1/S2. No murmurs, rubs, or gallops appreciated. Lungs: Clear bilaterally to ascultation.  Abdomen: Normoactive bowel sounds. No tenderness to deep or light palpation. No rebound or guarding.   Psych: Pleasant and appropriate  HEENT Multiple teeth missing, moderate dentition with + caries  MSK Bilateral shoulders  Slouches to Right side  Reduced ROM of R shoulder with limited abduction No pain with passive ROM  LE  No edema Feet without lesions or ulcers + Thickened hypertrophied nails     ASSESSMENT/PLAN:   Type 2 diabetes mellitus with stage 3 chronic kidney disease, without long-term current use of insulin (HCC) A1C today. Doing well from dietary standpoint.   Vitamin D deficiency Vitamin D level today   Urge incontinence Patient benefits from incontinence pads and diapers for condition This helps limit skin breakdown DME order for both   Vascular dementia (HCC) - Doing well with 24/7 care from daughter in law - [facility] consult to see if patient would qualify for CAPS program given how much assistance she requires   Hypertension Monitoring labs today Continue current therapy    Shoulder pain, right This is a chronic issue Suspect OA vs. Frozen shoulder given reduce ROM with passive exam Discussed and offered X-ray  Declined for now Voltaren gel to pharmacy   HCM Flu and COVID given today Consider PCV20 at follow up      Mary Starr, MD  Family Medicine Teaching Service  [facility]

## 2021-12-24 NOTE — Patient Instructions (Signed)
It was wonderful to see you today.  Please bring ALL of your medications with you to every visit.   Today we talked about:  - You are doing great with your health   - I have asked Social Work to reach out about CAPS (help with caregiving)  - I have ordered gloves, incontinence pads, and adult diapers   - We will get blood work today  - If everything looks good, you can follow up in 1 year   Please follow up in 12 months   Thank you for choosing [facility].   Please call [phone] with any questions about today's appointment.  Please be sure to schedule follow up at the front  desk before you leave today.   Terisa Starr, MD  Family Medicine

## 2021-12-24 NOTE — Assessment & Plan Note (Signed)
-   Doing well with 24/7 care from daughter in law [facility] consult to see if patient would qualify for CAPS program given how much assistance she requires

## 2021-12-24 NOTE — Telephone Encounter (Signed)
Received message from Dr. Manson Passey regarding DME orders for incontinence supplies. Community message sent to [employer]. Will await response.   Veronda Prude, RN

## 2021-12-24 NOTE — Assessment & Plan Note (Signed)
Patient benefits from incontinence pads and diapers for condition This helps limit skin breakdown DME order for both

## 2021-12-24 NOTE — Assessment & Plan Note (Signed)
A1C today. Doing well from dietary standpoint.

## 2021-12-24 NOTE — Assessment & Plan Note (Signed)
Vitamin D level today

## 2021-12-24 NOTE — Progress Notes (Unsigned)
  Care Coordination  Outreach Note  12/24/2021 Name: TOLEEN LACHAPELLE MRN: [id number] DOB: December 27, 1928   Care Coordination Outreach Attempts: An unsuccessful telephone outreach was attempted today to offer the patient information about available care coordination services as a benefit of their health plan.   Follow Up Plan:  Additional outreach attempts will be made to offer the patient care coordination information and services.   Encounter Outcome:  Pt. Request to Call Back  [facility] Coordination Care Guide  Direct Dial: [phone]

## 2021-12-24 NOTE — Assessment & Plan Note (Signed)
Monitoring labs today Continue current therapy

## 2021-12-25 ENCOUNTER — Telehealth: Payer: Self-pay | Admitting: Family Medicine

## 2021-12-25 DIAGNOSIS — E1159 Type 2 diabetes mellitus with other circulatory complications: Secondary | ICD-10-CM

## 2021-12-25 DIAGNOSIS — F0150 Vascular dementia without behavioral disturbance: Secondary | ICD-10-CM

## 2021-12-25 LAB — COMPREHENSIVE METABOLIC PANEL
ALT: 7 IU/L (ref 0–32)
AST: 14 IU/L (ref 0–40)
Albumin/Globulin Ratio: 0.8 — ABNORMAL LOW (ref 1.2–2.2)
Albumin: 3.8 g/dL (ref 3.6–4.6)
Alkaline Phosphatase: 88 IU/L (ref 44–121)
BUN/Creatinine Ratio: 24 (ref 12–28)
BUN: 20 mg/dL (ref 10–36)
Bilirubin Total: 0.3 mg/dL (ref 0.0–1.2)
CO2: 24 mmol/L (ref 20–29)
Calcium: 9.8 mg/dL (ref 8.7–10.3)
Chloride: 104 mmol/L (ref 96–106)
Creatinine, Ser: 0.85 mg/dL (ref 0.57–1.00)
Globulin, Total: 4.5 g/dL (ref 1.5–4.5)
Glucose: 120 mg/dL — ABNORMAL HIGH (ref 70–99)
Potassium: 4 mmol/L (ref 3.5–5.2)
Sodium: 141 mmol/L (ref 134–144)
Total Protein: 8.3 g/dL (ref 6.0–8.5)
eGFR: 65 mL/min/{1.73_m2} (ref >59)

## 2021-12-25 LAB — LIPID PANEL
Chol/HDL Ratio: 4 ratio (ref 0.0–4.4)
Cholesterol, Total: 213 mg/dL — ABNORMAL HIGH (ref 100–199)
HDL: 53 mg/dL (ref >39)
LDL Chol Calc (NIH): 137 mg/dL — ABNORMAL HIGH (ref 0–99)
Triglycerides: 127 mg/dL (ref 0–149)
VLDL Cholesterol Cal: 23 mg/dL (ref 5–40)

## 2021-12-25 LAB — VITAMIN D 25 HYDROXY (VIT D DEFICIENCY, FRACTURES): Vit D, 25-Hydroxy: 24.1 ng/mL — ABNORMAL LOW (ref 30.0–100.0)

## 2021-12-25 MED ORDER — OYSTER SHELL CALCIUM/D3 500-5 MG-MCG PO TABS: 1.0000 | ORAL_TABLET | Freq: Every day | ORAL | 3 refills | Status: DC

## 2021-12-25 MED ORDER — ATORVASTATIN CALCIUM 40 MG PO TABS: 40.0000 mg | ORAL_TABLET | Freq: Every day | ORAL | 3 refills | Status: DC

## 2021-12-25 MED ORDER — CANDESARTAN CILEXETIL 8 MG PO TABS: 8.0000 mg | ORAL_TABLET | Freq: Every day | ORAL | 3 refills | Status: DC

## 2021-12-25 MED ORDER — AMLODIPINE BESYLATE 5 MG PO TABS: ORAL_TABLET | ORAL | 3 refills | Status: DC

## 2021-12-25 NOTE — Telephone Encounter (Signed)
Called with results. LDL above goal given stroke. Discussed options given age. Increased statin to 40 mg. All questions answered. Terisa Starr, MD  Family Medicine Teaching Service

## 2021-12-27 NOTE — Progress Notes (Signed)
  Care Coordination   Note   12/27/2021 Name: Mary Walters MRN: [id number] DOB: 10/29/1928  Mary Walters is a 88 y.o. year old female who sees Fayette Pho, MD for primary care. I reached out to Mary Walters by phone today to offer care coordination services.  [name] was given information about Care Coordination services today including:   The Care Coordination services include support from the care team which includes your Nurse Coordinator, Clinical Social Worker, or Pharmacist.  The Care Coordination team is here to help remove barriers to the health concerns and goals most important to you. Care Coordination services are voluntary, and the patient may decline or stop services at any time by request to their care team member.   Care Coordination Consent Status: Patient son Mary Walters [facility] on file  did not agree to participate in care coordination services at this time.   Encounter Outcome:  Pt. Refused  [facility] Coordination Care Guide  Direct Dial: [phone]

## 2022-01-04 NOTE — Telephone Encounter (Signed)
Receipt confirmed by [employer].   Margaret Staggs C Astor Gentle, RN  

## 2022-02-12 IMAGING — DX DG CHEST 1V PORT
1 series · 1 of 1 positions shown · non-contrast
Comparison: Portable exam 7002 hours compared to 06/22/2020

CLINICAL DATA: Worsened [id number] symptoms, shallow respirations,
increased difficulty breathing, diabetes mellitus, hypertension

EXAM:
PORTABLE CHEST 1 VIEW

[chest]
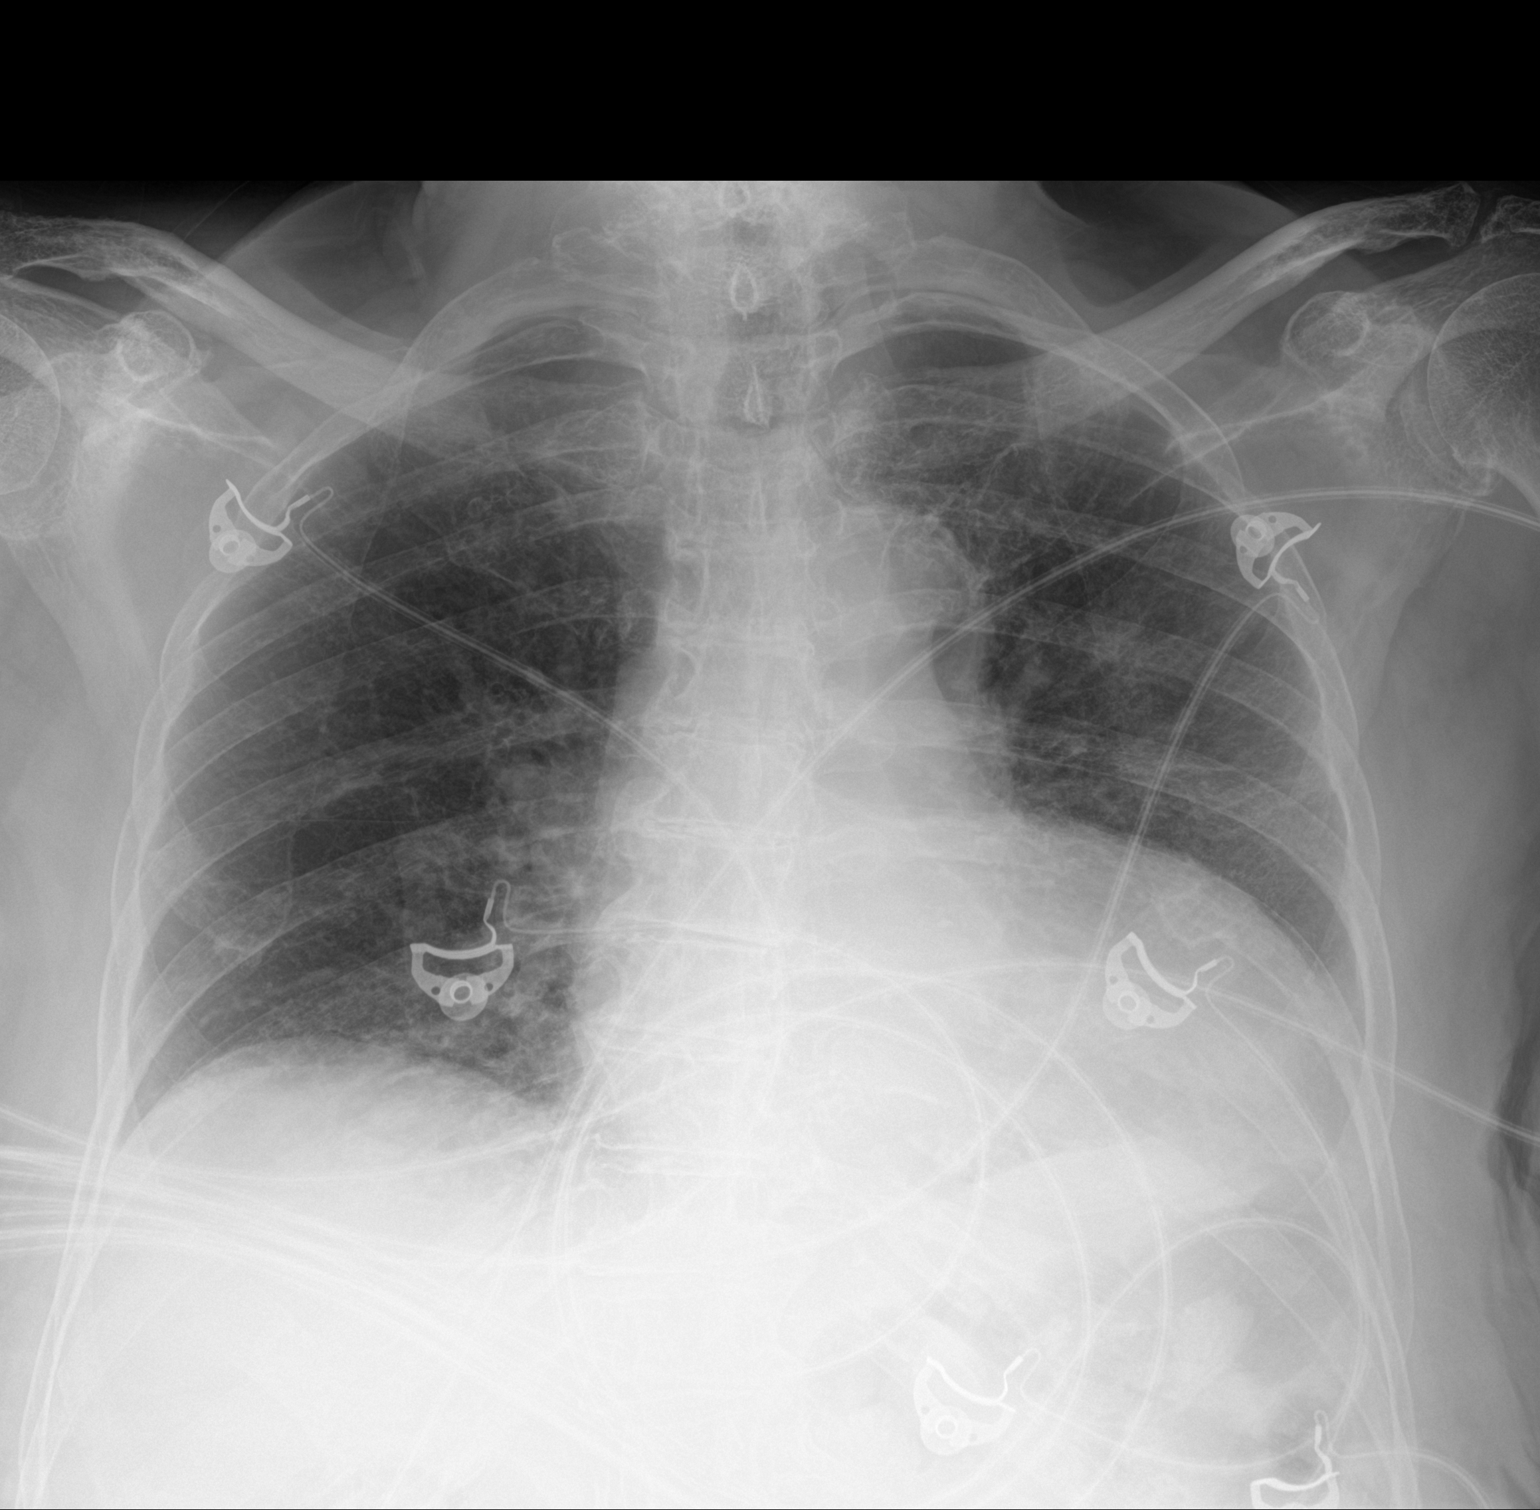

[1 of 1 positions shown; findings below may reference images not displayed]

FINDINGS: Enlargement of cardiac silhouette.

Mediastinal contours and pulmonary vascularity normal.

Bibasilar atelectasis.

Improved pulmonary infiltrates since previous exam with residual at
RIGHT base.

No pleural effusion or pneumothorax.

Bones demineralized.
IMPRESSION: Enlargement of cardiac silhouette.

Bibasilar atelectasis with interval improvement in pulmonary
infiltrates.

## 2022-02-18 ENCOUNTER — Ambulatory Visit (INDEPENDENT_AMBULATORY_CARE_PROVIDER_SITE_OTHER): Payer: Medicare HMO

## 2022-02-18 NOTE — Progress Notes (Unsigned)
I connected with  Felix Ahmadi on 02/18/22 by a audio enabled telemedicine application and verified that I am speaking with the correct person using two identifiers.  Patient Location: Home  Provider Location: Office/Clinic  I discussed the limitations of evaluation and management by telemedicine. The patient expressed understanding and agreed to proceed.  Subjective:   Mary Walters is a 89 y.o. female who presents for Medicare Annual (Subsequent) preventive examination.  Review of Systems    Per HPI unless specifically indicated below.  Cardiac Risk Factors include: advanced age (>72men, >26 women);female gender          Objective:    There were no vitals filed for this visit. There is no height or weight on file to calculate BMI.     12/24/2021   10:12 AM 07/04/2020   11:47 AM 04/10/2020    1:28 PM 02/18/2019    1:57 PM 04/11/2018    2:04 PM 09/04/2017    1:44 PM 08/06/2017    9:59 AM  Advanced Directives  Does Patient Have a Medical Advance Directive? Yes Unable to assess, patient is non-responsive or altered mental status No No No No No  Type of Advance Directive [place];Living will        Copy of [place] in Chart? No - copy requested        Would patient like information on creating a medical advance directive?   No - Patient declined No - Patient declined No - Patient declined Yes (MAU/Ambulatory/Procedural Areas - Information given) No - Patient declined    Current Medications (verified) Outpatient Encounter Medications as of 02/18/2022  Medication Sig   acetaminophen (TYLENOL) 325 MG tablet Take 650 mg by mouth every 6 (six) hours as needed.   amLODipine (NORVASC) 5 MG tablet TAKE 1 TABLET BY MOUTH EVERYDAY AT BEDTIME   aspirin EC 81 MG tablet Take 81 mg by mouth at bedtime.   atorvastatin (LIPITOR) 40 MG tablet Take 1 tablet (40 mg total) by mouth daily.   Blood Glucose Monitoring Suppl (ONETOUCH VERIO FLEX SYSTEM)  w/Device KIT Check once daily in the morning before breakfast (Patient taking differently: 1 each by Other route daily.)   calcium-vitamin D (OSCAL WITH D) 500-5 MG-MCG tablet Take 1 tablet by mouth daily with breakfast.   candesartan (ATACAND) 8 MG tablet Take 1 tablet (8 mg total) by mouth daily.   Cholecalciferol (VITAMIN D) 50 MCG (2000 UT) tablet Take 2,000 Units by mouth daily.   diclofenac Sodium (VOLTAREN) 1 % GEL Apply 4 g topically 4 (four) times daily.   glucose blood test strip Use as instructed (Patient taking differently: 1 each by Other route See admin instructions. Use as instructed)   Lancet Devices (ONE TOUCH DELICA LANCING DEV) MISC 1 Device by Does not apply route daily as needed.   OneTouch Delica Lancets 69G MISC 1 Units by Does not apply route daily as needed.   No facility-administered encounter medications on file as of 02/18/2022.    Allergies (verified) Patient has no known allergies.   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 Family History  Problem Relation Age of Onset   Hypertension Mother  Social History   Socioeconomic History   Marital status: Widowed    Spouse name: Not on file   Number of children: Not on file   Years of education: Not on file   Highest education level: Not on file  Occupational History   Occupation: radiology tech    Comment: retired  Tobacco Use   Smoking status: Never   Smokeless tobacco: Never  Vaping Use   Vaping Use: Never used  Substance and Sexual Activity   Alcohol use: Yes    Alcohol/week: 2.0 standard drinks of alcohol    Types: 1 Glasses of wine, 1 Shots of liquor per week    Comment: Patient occasionally drinks wine.  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   Social Determinants of Health   Financial Resource Strain: Not on file  Food Insecurity: Not on file  Transportation Needs: Not on file  Physical Activity: Not on file  Stress: Not on file  Social Connections: Unknown (09/04/2017)   Social Connection and Isolation Panel [NHANES]    Frequency of Communication with Friends and Family: Not on file    Frequency of Social Gatherings with Friends and Family: More than three times a week    Attends Religious Services: Not on file    Active Member of Clubs or Organizations: Not on file    Attends Banker Meetings: Not on file    Marital Status: Not on file    Tobacco Counseling Counseling given: Not Answered   Clinical Intake:                 Diabetic?***         Activities of Daily Living     No data to display          Patient Care Team: Fayette Pho, MD as PCP - General (Family Medicine) Shon Hale, MD (Family Medicine)  Indicate any recent Medical Services you may have received from other than [facility] providers in the past year (date may be approximate).     Assessment:   This is a routine wellness examination for Mary Walters.  Hearing/Vision screen No results found.  Dietary issues and exercise activities discussed:     Goals Addressed   None    Depression Screen    12/24/2021   10:12 AM 04/10/2020    1:40 PM 04/10/2020    1:28 PM 06/23/2018    1:56 PM 09/04/2017    1:43 PM 08/06/2017    9:59 AM 06/02/2017   10:25 AM  PHQ 2/9 Scores  PHQ - 2 Score 5 2 2  0 0 0 0  PHQ- 9 Score 19 4 4         Fall Risk    12/24/2021   10:13 AM 04/10/2020    1:28 PM 06/23/2018    1:56 PM  12/02/2017    2:01 PM 09/04/2017    1:43 PM  Fall Risk   Falls in the past year? 0 0 1 1 No  Number falls in past yr: 0 0 1 1   Injury with Fall? 0 0 0 0   Risk for fall due to : History of fall(s)  Follow up Falls evaluation completed;Falls prevention discussed  Follow up appointment    Comment   md informed      FALL RISK PREVENTION PERTAINING TO THE HOME:  Any stairs in or around the home? {YES/NO:21197} If so, are there any without handrails? {YES/NO:21197} Home free of loose throw rugs in walkways, pet beds, electrical cords, etc? {YES/NO:21197} Adequate lighting in your home to reduce risk of falls? {YES/NO:21197}  ASSISTIVE DEVICES UTILIZED TO PREVENT FALLS:  Life alert? {YES/NO:21197} Use of a cane, walker or w/c? {YES/NO:21197} Grab bars in the bathroom? {YES/NO:21197} Shower chair or bench in shower? {YES/NO:21197} Elevated toilet seat or a handicapped toilet? {YES/NO:21197}  TIMED UP AND GO:  Was the test performed? {YES/NO:21197}.  Length of time to ambulate 10 feet: *** sec.   {Appearance of FTDD:2202542}  Cognitive Function:        Immunizations Immunization History  Administered Date(s) Administered   COVID-19, mRNA, vaccine(Comirnaty)12 years and older 12/24/2021   Fluad Quad(high Dose 65+) 12/24/2021   Pneumococcal Conjugate-13 06/02/2017   Tdap 06/02/2017   Zoster Recombinat (Shingrix) 09/05/2017    {TDAP status:2101805}  {Flu Vaccine status:2101806}  {Pneumococcal vaccine status:2101807}  {Covid-19 vaccine status:2101808}  Qualifies for Shingles Vaccine? {YES/NO:21197}  Zostavax completed {YES/NO:21197}  {Shingrix Completed?:2101804}  Screening Tests Health Maintenance  Topic Date Due   Medicare Annual Wellness (AWV)  Never done   OPHTHALMOLOGY EXAM  Never done   DEXA SCAN  Never done   Zoster Vaccines- Shingrix (2 of 2) 10/31/2017   Pneumonia Vaccine 77+ Years old (2 - PPSV23 or PCV20) 06/03/2018   HEMOGLOBIN A1C  10/11/2020    FOOT EXAM  04/11/2022   DTaP/Tdap/Td (2 - Td or Tdap) 06/03/2027   INFLUENZA VACCINE  Completed   COVID-19 Vaccine  Completed   HPV VACCINES  Aged Out    Health Maintenance  Health Maintenance Due  Topic Date Due   Medicare Annual Wellness (AWV)  Never done   OPHTHALMOLOGY EXAM  Never done   DEXA SCAN  Never done   Zoster Vaccines- Shingrix (2 of 2) 10/31/2017   Pneumonia Vaccine 21+ Years old (2 - PPSV23 or PCV20) 06/03/2018   HEMOGLOBIN A1C  10/11/2020    {Colorectal cancer screening:2101809}  {Mammogram status:21018020}  {Bone Density status:21018021}  Lung Cancer Screening: (Low Dose CT Chest recommended if Age 55-80 years, 30 pack-year currently smoking OR have quit w/in 15years.) {DOES NOT does:27190::"does not"} qualify.   Lung Cancer Screening Referral: ***  Additional Screening:  Hepatitis C Screening: {DOES NOT does:27190::"does not"} qualify; Completed ***  Vision Screening: Recommended annual ophthalmology exams for early detection of glaucoma and other disorders of the eye. Is the patient up to date with their annual eye exam?  {YES/NO:21197} Who is the provider or what is the name of the office in which the patient attends annual eye exams? *** If pt is not established with a provider, would they like to be referred to a provider to establish care? {YES/NO:21197}.   Dental Screening: Recommended annual dental exams for proper oral hygiene  Community Resource Referral / Chronic Care Management: CRR required this visit?  {YES/NO:21197}  CCM required this visit?  {YES/NO:21197}     Plan:     I have personally reviewed and noted the following in the patient's chart:   Medical and social history Use of alcohol, tobacco or illicit drugs  Current medications and supplements including opioid prescriptions. {Opioid Prescriptions:[phone]} Functional ability and status Nutritional status Physical activity Advanced directives List of other  physicians Hospitalizations, surgeries, and ER visits in previous 12 months Vitals Screenings to include cognitive, depression, and falls Referrals and appointments  In addition, I have reviewed and discussed with patient certain preventive protocols, quality metrics, and best practice recommendations. A written personalized care plan for preventive services as well as general preventive health recommendations were provided to patient.     Wilson Singer, [place]   02/18/2022   Nurse Notes: ***

## 2022-02-18 NOTE — Patient Instructions (Signed)

## 2022-02-19 ENCOUNTER — Ambulatory Visit: Payer: Medicare HMO

## 2022-04-04 ENCOUNTER — Other Ambulatory Visit: Payer: Self-pay

## 2022-04-04 ENCOUNTER — Telehealth: Payer: Self-pay

## 2022-04-04 NOTE — Telephone Encounter (Signed)
Spoke with daughter in [employer]. She stated that the patient doesn't use [place]. Only [facility] on [address] Salvatore Marvel, CMA

## 2022-04-12 NOTE — Progress Notes (Unsigned)
    SUBJECTIVE:   CHIEF COMPLAINT: check in at home  HPI:   Mary Walters is a 89 y.o.  with history notable for vascular dementia and HTN presenting for a home visit. Visit conducted at home due to patient's difficulty leaving the home due to underlying memory impairment.   She is joined by her caregiver and daugther in [employer] [place].  The patient reports no concerns.  Per daughter in law she has had no falls. She eats 2 meals per day. She sleeps a bit at night then sleeps most of morning. She has trouble recognizing some family members at this time--usually naming is challenge. Other times, her memory is sharp. No issues with skin breakdown. She uses a walker, has hospital bed and bedside commode. Family makes her homemade meals. She has social interaction through family.  She takes her medications as prescribed. Needs prompted to take medications, eat and sometimes go to the bathroom.   The patient has incontinence, urge and stress type. This is in part related to her memory impairment. She uses incontinence pads. This prevents skin breakdown and keeps her clean.     PERTINENT  PMH / PSH/Family/Social History :  Vascular dementia   OBJECTIVE:   BP 136/80   Pulse 92   Resp 14   Wt 145 lb (65.8 kg)   SpO2 97%   BMI 28.32 kg/m   Today's weight:  Last Weight  Most recent update: 04/15/2022  2:37 PM    Weight  65.8 kg (145 lb)            Review of prior weights: Filed Weights   04/15/22 1436  Weight: 145 lb (65.8 kg)     Cardiac: Regular rate and rhythm. Normal S1/S2. No murmurs, rubs, or gallops appreciated. Lungs: Clear bilaterally to ascultation.  Abdomen: Normoactive bowel sounds. No tenderness to deep or light palpation. No rebound or guarding.    Psych: Pleasant and appropriate  Feet + white scaling between digits on R  ASSESSMENT/PLAN:   Urge incontinence Rx for incontinence pads  Family doing exceptional job with skin care   Vascular dementia  ([place]) Progressing as expected Family very supportive Has ACP on file   Hypertension At goal continue current medications  Will ask family to check BP 2X per month Continue amlodipine and candesartan   Tinea Pedis Rx ketoconazole ream      Dorris Singh, MD  [facility]

## 2022-04-15 ENCOUNTER — Encounter: Payer: Self-pay | Admitting: Family Medicine

## 2022-04-15 ENCOUNTER — Ambulatory Visit (INDEPENDENT_AMBULATORY_CARE_PROVIDER_SITE_OTHER): Payer: Medicare HMO | Admitting: Family Medicine

## 2022-04-15 VITALS — BP 136/80 | HR 92 | Resp 14 | Wt 145.0 lb

## 2022-04-15 DIAGNOSIS — F0150 Vascular dementia without behavioral disturbance: Secondary | ICD-10-CM

## 2022-04-15 DIAGNOSIS — N3946 Mixed incontinence: Secondary | ICD-10-CM

## 2022-04-15 DIAGNOSIS — N3941 Urge incontinence: Secondary | ICD-10-CM

## 2022-04-15 DIAGNOSIS — I679 Cerebrovascular disease, unspecified: Secondary | ICD-10-CM | POA: Diagnosis not present

## 2022-04-15 DIAGNOSIS — I10 Essential (primary) hypertension: Secondary | ICD-10-CM

## 2022-04-15 MED ORDER — KETOCONAZOLE 2 % EX CREA: 1.0000 | TOPICAL_CREAM | Freq: Every day | CUTANEOUS | 1 refills | Status: DC

## 2022-04-15 NOTE — Assessment & Plan Note (Signed)
Progressing as expected Family very supportive Has ACP on file

## 2022-04-15 NOTE — Assessment & Plan Note (Signed)
Rx for incontinence pads  Family doing exceptional job with skin care

## 2022-04-15 NOTE — Assessment & Plan Note (Signed)
At goal continue current medications

## 2022-05-16 ENCOUNTER — Telehealth: Payer: Self-pay | Admitting: Family Medicine

## 2022-05-16 NOTE — Telephone Encounter (Signed)
Called patient's daughter-in-law.   Patient has had change in mental status. She is reporting she is 'tired' and now leaning to left. Daughter in law woke patient up from nap and is now not tired and back to usual self. Discussed calling EMS and coming to ED for imaging. Daughter will watch for now, if any other changes, she is to call EMS and have them evaluate to come to ED.   Terisa Starr, MD  Family Medicine Teaching Service

## 2022-06-04 ENCOUNTER — Telehealth: Payer: Self-pay | Admitting: Family Medicine

## 2022-06-04 DIAGNOSIS — R2681 Unsteadiness on feet: Secondary | ICD-10-CM

## 2022-06-04 DIAGNOSIS — F0150 Vascular dementia without behavioral disturbance: Secondary | ICD-10-CM

## 2022-06-04 DIAGNOSIS — I6300 Cerebral infarction due to thrombosis of unspecified precerebral artery: Secondary | ICD-10-CM

## 2022-06-04 NOTE — Telephone Encounter (Signed)
Called patient's daughter-in-law. She reports some reduced mobility, desires new lift and PT. PT and new lift ordered.  Terisa Starr, MD  Family Medicine Teaching Service

## 2022-06-12 DIAGNOSIS — N3946 Mixed incontinence: Secondary | ICD-10-CM | POA: Diagnosis not present

## 2022-06-12 DIAGNOSIS — N183 Chronic kidney disease, stage 3 unspecified: Secondary | ICD-10-CM | POA: Diagnosis not present

## 2022-06-12 DIAGNOSIS — E559 Vitamin D deficiency, unspecified: Secondary | ICD-10-CM | POA: Diagnosis not present

## 2022-06-12 DIAGNOSIS — I10 Essential (primary) hypertension: Secondary | ICD-10-CM | POA: Diagnosis not present

## 2022-06-12 DIAGNOSIS — E1140 Type 2 diabetes mellitus with diabetic neuropathy, unspecified: Secondary | ICD-10-CM | POA: Diagnosis not present

## 2022-06-12 DIAGNOSIS — I69318 Other symptoms and signs involving cognitive functions following cerebral infarction: Secondary | ICD-10-CM | POA: Diagnosis not present

## 2022-06-12 DIAGNOSIS — Z7982 Long term (current) use of aspirin: Secondary | ICD-10-CM | POA: Diagnosis not present

## 2022-06-12 DIAGNOSIS — F0150 Vascular dementia without behavioral disturbance: Secondary | ICD-10-CM | POA: Diagnosis not present

## 2022-06-12 DIAGNOSIS — Z8616 Personal history of COVID-19: Secondary | ICD-10-CM | POA: Diagnosis not present

## 2022-06-12 DIAGNOSIS — E1122 Type 2 diabetes mellitus with diabetic chronic kidney disease: Secondary | ICD-10-CM | POA: Diagnosis not present

## 2022-06-14 ENCOUNTER — Telehealth: Payer: Self-pay

## 2022-06-14 DIAGNOSIS — I69318 Other symptoms and signs involving cognitive functions following cerebral infarction: Secondary | ICD-10-CM | POA: Diagnosis not present

## 2022-06-14 DIAGNOSIS — E1140 Type 2 diabetes mellitus with diabetic neuropathy, unspecified: Secondary | ICD-10-CM | POA: Diagnosis not present

## 2022-06-14 DIAGNOSIS — N3946 Mixed incontinence: Secondary | ICD-10-CM | POA: Diagnosis not present

## 2022-06-14 DIAGNOSIS — E559 Vitamin D deficiency, unspecified: Secondary | ICD-10-CM | POA: Diagnosis not present

## 2022-06-14 DIAGNOSIS — E1122 Type 2 diabetes mellitus with diabetic chronic kidney disease: Secondary | ICD-10-CM | POA: Diagnosis not present

## 2022-06-14 DIAGNOSIS — I10 Essential (primary) hypertension: Secondary | ICD-10-CM | POA: Diagnosis not present

## 2022-06-14 DIAGNOSIS — F0150 Vascular dementia without behavioral disturbance: Secondary | ICD-10-CM | POA: Diagnosis not present

## 2022-06-14 DIAGNOSIS — Z8616 Personal history of COVID-19: Secondary | ICD-10-CM | POA: Diagnosis not present

## 2022-06-14 DIAGNOSIS — Z7982 Long term (current) use of aspirin: Secondary | ICD-10-CM | POA: Diagnosis not present

## 2022-06-14 DIAGNOSIS — N183 Chronic kidney disease, stage 3 unspecified: Secondary | ICD-10-CM | POA: Diagnosis not present

## 2022-06-14 NOTE — Telephone Encounter (Signed)
[facility] PT calls nurse line requesting verbal orders for [facility] PT as follows.   1x a week for 1 week 2x a week for 3 weeks 1x a week for 3 weeks   Verbal order given.

## 2022-06-18 DIAGNOSIS — E1140 Type 2 diabetes mellitus with diabetic neuropathy, unspecified: Secondary | ICD-10-CM | POA: Diagnosis not present

## 2022-06-18 DIAGNOSIS — Z8616 Personal history of COVID-19: Secondary | ICD-10-CM | POA: Diagnosis not present

## 2022-06-18 DIAGNOSIS — N3946 Mixed incontinence: Secondary | ICD-10-CM | POA: Diagnosis not present

## 2022-06-18 DIAGNOSIS — E1122 Type 2 diabetes mellitus with diabetic chronic kidney disease: Secondary | ICD-10-CM | POA: Diagnosis not present

## 2022-06-18 DIAGNOSIS — Z7982 Long term (current) use of aspirin: Secondary | ICD-10-CM | POA: Diagnosis not present

## 2022-06-18 DIAGNOSIS — F0150 Vascular dementia without behavioral disturbance: Secondary | ICD-10-CM | POA: Diagnosis not present

## 2022-06-18 DIAGNOSIS — N183 Chronic kidney disease, stage 3 unspecified: Secondary | ICD-10-CM | POA: Diagnosis not present

## 2022-06-18 DIAGNOSIS — E559 Vitamin D deficiency, unspecified: Secondary | ICD-10-CM | POA: Diagnosis not present

## 2022-06-18 DIAGNOSIS — I10 Essential (primary) hypertension: Secondary | ICD-10-CM | POA: Diagnosis not present

## 2022-06-18 DIAGNOSIS — I69318 Other symptoms and signs involving cognitive functions following cerebral infarction: Secondary | ICD-10-CM | POA: Diagnosis not present

## 2022-06-19 DIAGNOSIS — Z8616 Personal history of COVID-19: Secondary | ICD-10-CM | POA: Diagnosis not present

## 2022-06-19 DIAGNOSIS — I10 Essential (primary) hypertension: Secondary | ICD-10-CM | POA: Diagnosis not present

## 2022-06-19 DIAGNOSIS — E559 Vitamin D deficiency, unspecified: Secondary | ICD-10-CM | POA: Diagnosis not present

## 2022-06-19 DIAGNOSIS — F0150 Vascular dementia without behavioral disturbance: Secondary | ICD-10-CM | POA: Diagnosis not present

## 2022-06-19 DIAGNOSIS — E1140 Type 2 diabetes mellitus with diabetic neuropathy, unspecified: Secondary | ICD-10-CM | POA: Diagnosis not present

## 2022-06-19 DIAGNOSIS — N3946 Mixed incontinence: Secondary | ICD-10-CM | POA: Diagnosis not present

## 2022-06-19 DIAGNOSIS — I69318 Other symptoms and signs involving cognitive functions following cerebral infarction: Secondary | ICD-10-CM | POA: Diagnosis not present

## 2022-06-19 DIAGNOSIS — Z7982 Long term (current) use of aspirin: Secondary | ICD-10-CM | POA: Diagnosis not present

## 2022-06-19 DIAGNOSIS — E1122 Type 2 diabetes mellitus with diabetic chronic kidney disease: Secondary | ICD-10-CM | POA: Diagnosis not present

## 2022-06-19 DIAGNOSIS — N183 Chronic kidney disease, stage 3 unspecified: Secondary | ICD-10-CM | POA: Diagnosis not present

## 2022-06-20 DIAGNOSIS — I69318 Other symptoms and signs involving cognitive functions following cerebral infarction: Secondary | ICD-10-CM | POA: Diagnosis not present

## 2022-06-20 DIAGNOSIS — I10 Essential (primary) hypertension: Secondary | ICD-10-CM | POA: Diagnosis not present

## 2022-06-20 DIAGNOSIS — N183 Chronic kidney disease, stage 3 unspecified: Secondary | ICD-10-CM | POA: Diagnosis not present

## 2022-06-20 DIAGNOSIS — E1122 Type 2 diabetes mellitus with diabetic chronic kidney disease: Secondary | ICD-10-CM | POA: Diagnosis not present

## 2022-06-20 DIAGNOSIS — F0150 Vascular dementia without behavioral disturbance: Secondary | ICD-10-CM | POA: Diagnosis not present

## 2022-06-20 DIAGNOSIS — Z7982 Long term (current) use of aspirin: Secondary | ICD-10-CM | POA: Diagnosis not present

## 2022-06-20 DIAGNOSIS — N3946 Mixed incontinence: Secondary | ICD-10-CM | POA: Diagnosis not present

## 2022-06-20 DIAGNOSIS — E559 Vitamin D deficiency, unspecified: Secondary | ICD-10-CM | POA: Diagnosis not present

## 2022-06-20 DIAGNOSIS — Z8616 Personal history of COVID-19: Secondary | ICD-10-CM | POA: Diagnosis not present

## 2022-06-20 DIAGNOSIS — E1140 Type 2 diabetes mellitus with diabetic neuropathy, unspecified: Secondary | ICD-10-CM | POA: Diagnosis not present

## 2022-06-24 DIAGNOSIS — I10 Essential (primary) hypertension: Secondary | ICD-10-CM | POA: Diagnosis not present

## 2022-06-24 DIAGNOSIS — N3946 Mixed incontinence: Secondary | ICD-10-CM | POA: Diagnosis not present

## 2022-06-24 DIAGNOSIS — E1122 Type 2 diabetes mellitus with diabetic chronic kidney disease: Secondary | ICD-10-CM | POA: Diagnosis not present

## 2022-06-24 DIAGNOSIS — N183 Chronic kidney disease, stage 3 unspecified: Secondary | ICD-10-CM | POA: Diagnosis not present

## 2022-06-24 DIAGNOSIS — Z8616 Personal history of COVID-19: Secondary | ICD-10-CM | POA: Diagnosis not present

## 2022-06-24 DIAGNOSIS — Z7982 Long term (current) use of aspirin: Secondary | ICD-10-CM | POA: Diagnosis not present

## 2022-06-24 DIAGNOSIS — E559 Vitamin D deficiency, unspecified: Secondary | ICD-10-CM | POA: Diagnosis not present

## 2022-06-24 DIAGNOSIS — E1140 Type 2 diabetes mellitus with diabetic neuropathy, unspecified: Secondary | ICD-10-CM | POA: Diagnosis not present

## 2022-06-24 DIAGNOSIS — F0150 Vascular dementia without behavioral disturbance: Secondary | ICD-10-CM | POA: Diagnosis not present

## 2022-06-24 DIAGNOSIS — I69318 Other symptoms and signs involving cognitive functions following cerebral infarction: Secondary | ICD-10-CM | POA: Diagnosis not present

## 2022-06-26 DIAGNOSIS — I69318 Other symptoms and signs involving cognitive functions following cerebral infarction: Secondary | ICD-10-CM | POA: Diagnosis not present

## 2022-06-26 DIAGNOSIS — Z8616 Personal history of COVID-19: Secondary | ICD-10-CM | POA: Diagnosis not present

## 2022-06-26 DIAGNOSIS — E559 Vitamin D deficiency, unspecified: Secondary | ICD-10-CM | POA: Diagnosis not present

## 2022-06-26 DIAGNOSIS — F0150 Vascular dementia without behavioral disturbance: Secondary | ICD-10-CM | POA: Diagnosis not present

## 2022-06-26 DIAGNOSIS — I10 Essential (primary) hypertension: Secondary | ICD-10-CM | POA: Diagnosis not present

## 2022-06-26 DIAGNOSIS — N183 Chronic kidney disease, stage 3 unspecified: Secondary | ICD-10-CM | POA: Diagnosis not present

## 2022-06-26 DIAGNOSIS — N3946 Mixed incontinence: Secondary | ICD-10-CM | POA: Diagnosis not present

## 2022-06-26 DIAGNOSIS — E1122 Type 2 diabetes mellitus with diabetic chronic kidney disease: Secondary | ICD-10-CM | POA: Diagnosis not present

## 2022-06-26 DIAGNOSIS — Z7982 Long term (current) use of aspirin: Secondary | ICD-10-CM | POA: Diagnosis not present

## 2022-06-26 DIAGNOSIS — E1140 Type 2 diabetes mellitus with diabetic neuropathy, unspecified: Secondary | ICD-10-CM | POA: Diagnosis not present

## 2022-06-28 DIAGNOSIS — N3946 Mixed incontinence: Secondary | ICD-10-CM | POA: Diagnosis not present

## 2022-06-28 DIAGNOSIS — E1122 Type 2 diabetes mellitus with diabetic chronic kidney disease: Secondary | ICD-10-CM | POA: Diagnosis not present

## 2022-06-28 DIAGNOSIS — N183 Chronic kidney disease, stage 3 unspecified: Secondary | ICD-10-CM | POA: Diagnosis not present

## 2022-06-28 DIAGNOSIS — E559 Vitamin D deficiency, unspecified: Secondary | ICD-10-CM | POA: Diagnosis not present

## 2022-06-28 DIAGNOSIS — Z8616 Personal history of COVID-19: Secondary | ICD-10-CM | POA: Diagnosis not present

## 2022-06-28 DIAGNOSIS — Z7982 Long term (current) use of aspirin: Secondary | ICD-10-CM | POA: Diagnosis not present

## 2022-06-28 DIAGNOSIS — F0150 Vascular dementia without behavioral disturbance: Secondary | ICD-10-CM | POA: Diagnosis not present

## 2022-06-28 DIAGNOSIS — I10 Essential (primary) hypertension: Secondary | ICD-10-CM | POA: Diagnosis not present

## 2022-06-28 DIAGNOSIS — I69318 Other symptoms and signs involving cognitive functions following cerebral infarction: Secondary | ICD-10-CM | POA: Diagnosis not present

## 2022-06-28 DIAGNOSIS — E1140 Type 2 diabetes mellitus with diabetic neuropathy, unspecified: Secondary | ICD-10-CM | POA: Diagnosis not present

## 2022-07-01 DIAGNOSIS — E559 Vitamin D deficiency, unspecified: Secondary | ICD-10-CM | POA: Diagnosis not present

## 2022-07-01 DIAGNOSIS — E1122 Type 2 diabetes mellitus with diabetic chronic kidney disease: Secondary | ICD-10-CM | POA: Diagnosis not present

## 2022-07-01 DIAGNOSIS — I69318 Other symptoms and signs involving cognitive functions following cerebral infarction: Secondary | ICD-10-CM | POA: Diagnosis not present

## 2022-07-01 DIAGNOSIS — Z8616 Personal history of COVID-19: Secondary | ICD-10-CM | POA: Diagnosis not present

## 2022-07-01 DIAGNOSIS — N183 Chronic kidney disease, stage 3 unspecified: Secondary | ICD-10-CM | POA: Diagnosis not present

## 2022-07-01 DIAGNOSIS — I10 Essential (primary) hypertension: Secondary | ICD-10-CM | POA: Diagnosis not present

## 2022-07-01 DIAGNOSIS — N3946 Mixed incontinence: Secondary | ICD-10-CM | POA: Diagnosis not present

## 2022-07-01 DIAGNOSIS — F0150 Vascular dementia without behavioral disturbance: Secondary | ICD-10-CM | POA: Diagnosis not present

## 2022-07-01 DIAGNOSIS — Z7982 Long term (current) use of aspirin: Secondary | ICD-10-CM | POA: Diagnosis not present

## 2022-07-01 DIAGNOSIS — E1140 Type 2 diabetes mellitus with diabetic neuropathy, unspecified: Secondary | ICD-10-CM | POA: Diagnosis not present

## 2022-07-03 DIAGNOSIS — I10 Essential (primary) hypertension: Secondary | ICD-10-CM | POA: Diagnosis not present

## 2022-07-03 DIAGNOSIS — N183 Chronic kidney disease, stage 3 unspecified: Secondary | ICD-10-CM | POA: Diagnosis not present

## 2022-07-03 DIAGNOSIS — F0150 Vascular dementia without behavioral disturbance: Secondary | ICD-10-CM | POA: Diagnosis not present

## 2022-07-03 DIAGNOSIS — E1140 Type 2 diabetes mellitus with diabetic neuropathy, unspecified: Secondary | ICD-10-CM | POA: Diagnosis not present

## 2022-07-03 DIAGNOSIS — I69318 Other symptoms and signs involving cognitive functions following cerebral infarction: Secondary | ICD-10-CM | POA: Diagnosis not present

## 2022-07-03 DIAGNOSIS — Z7982 Long term (current) use of aspirin: Secondary | ICD-10-CM | POA: Diagnosis not present

## 2022-07-03 DIAGNOSIS — N3946 Mixed incontinence: Secondary | ICD-10-CM | POA: Diagnosis not present

## 2022-07-03 DIAGNOSIS — E559 Vitamin D deficiency, unspecified: Secondary | ICD-10-CM | POA: Diagnosis not present

## 2022-07-03 DIAGNOSIS — E1122 Type 2 diabetes mellitus with diabetic chronic kidney disease: Secondary | ICD-10-CM | POA: Diagnosis not present

## 2022-07-03 DIAGNOSIS — Z8616 Personal history of COVID-19: Secondary | ICD-10-CM | POA: Diagnosis not present

## 2022-07-04 ENCOUNTER — Telehealth: Payer: Self-pay

## 2022-07-04 ENCOUNTER — Other Ambulatory Visit: Payer: Self-pay | Admitting: Family Medicine

## 2022-07-04 DIAGNOSIS — R300 Dysuria: Secondary | ICD-10-CM

## 2022-07-04 NOTE — Telephone Encounter (Signed)
Called and spoke with Darnelle at [facility]. She reports that UA was obtained last week, however, she is not showing any results for urine culture.   UA did show many bacteria, yeast and abnormal WBC.   She is going to fax report over to our office.   Veronda Prude, RN

## 2022-07-04 NOTE — Telephone Encounter (Signed)
Called DIL. Had urine culture with [facility] last week. Nursing- please call [facility] to determine results of urine culture--please have them fax result ASAP.  Discussed reasons to return to care and bring to ED at length.   Terisa Starr, MD  Family Medicine Teaching Service

## 2022-07-04 NOTE — Telephone Encounter (Signed)
Called daughter in law. She reports that patient has been experiencing fatigue, back pain and strong smell to her urine for the last several days.   Denies fever or chills. She is requesting orders for urine testing. Daughter in law reports that patient has difficulty with ambulating and it would be very difficult to bring her into the office.   She reports that patient is drinking normally, however, is having slight decreased appetite.   Spoke with Dr. Manson Passey who placed orders for urine culture.   Supportive measures discussed. Discussed return/ED precautions.   Contact for daughter is [phone].  Veronda Prude, RN

## 2022-07-04 NOTE — Telephone Encounter (Signed)
Received call from [facility]- PT with [facility] regarding patient.   Daughter is requesting to pick up UA supplies and bring in sample to check for UTI.   PT is unsure of what symptoms patient is currently experiencing.   Attempted to call daughter, no answer. VM full.   Will attempt to reach daughter later today.   Veronda Prude, RN

## 2022-07-05 ENCOUNTER — Other Ambulatory Visit: Payer: Medicare HMO

## 2022-07-05 DIAGNOSIS — R300 Dysuria: Secondary | ICD-10-CM | POA: Diagnosis not present

## 2022-07-07 LAB — URINE CULTURE

## 2022-07-09 ENCOUNTER — Telehealth: Payer: Self-pay | Admitting: Family Medicine

## 2022-07-09 NOTE — Telephone Encounter (Signed)
Received call from daughter in law. Patient has had reduced mobility and slow speech for last week, worse over last 1-2 days. She is repeating herself yesterday. This waxes and wanes--sometimes better, sometimes worse. Discussed she needs to be seen in ED if symptoms or appetite do not improve today. Otherwise, can schedule visit for tomorrow or Thursday. DIL will call for visit.  Terisa Starr, MD  Family Medicine Teaching Service

## 2022-07-09 NOTE — Telephone Encounter (Signed)
Spoke with DIL, Maralyn Sago, regarding patient.   She has concerns that patient is not wanting to get out of bed. "She just wants to rest." She has also been having increased fatigue.   Does not want to eat, "fighting and fussing",   16 oz of boost given earlier today.   No fever or other sick symptoms.   Spoke with Dr. Manson Passey. Transferred DIL to speak with provider.   Veronda Prude, RN

## 2022-07-10 ENCOUNTER — Encounter (HOSPITAL_COMMUNITY): Payer: Self-pay | Admitting: Emergency Medicine

## 2022-07-10 ENCOUNTER — Emergency Department (HOSPITAL_COMMUNITY)
Admission: EM | Admit: 2022-07-10 | Discharge: 2022-07-10 | Disposition: A | Discharge status: Home / Self Care | Payer: Medicare HMO | Attending: Emergency Medicine | Admitting: Emergency Medicine

## 2022-07-10 ENCOUNTER — Other Ambulatory Visit: Payer: Self-pay

## 2022-07-10 DIAGNOSIS — Z7982 Long term (current) use of aspirin: Secondary | ICD-10-CM | POA: Diagnosis not present

## 2022-07-10 DIAGNOSIS — E860 Dehydration: Secondary | ICD-10-CM | POA: Insufficient documentation

## 2022-07-10 DIAGNOSIS — Z743 Need for continuous supervision: Secondary | ICD-10-CM | POA: Diagnosis not present

## 2022-07-10 DIAGNOSIS — F0390 Unspecified dementia without behavioral disturbance: Secondary | ICD-10-CM | POA: Diagnosis not present

## 2022-07-10 DIAGNOSIS — R9431 Abnormal electrocardiogram [ECG] [EKG]: Secondary | ICD-10-CM | POA: Diagnosis not present

## 2022-07-10 DIAGNOSIS — Z7401 Bed confinement status: Secondary | ICD-10-CM | POA: Diagnosis not present

## 2022-07-10 DIAGNOSIS — Z794 Long term (current) use of insulin: Secondary | ICD-10-CM | POA: Insufficient documentation

## 2022-07-10 DIAGNOSIS — R531 Weakness: Secondary | ICD-10-CM | POA: Diagnosis not present

## 2022-07-10 DIAGNOSIS — R630 Anorexia: Secondary | ICD-10-CM | POA: Diagnosis not present

## 2022-07-10 LAB — CBC WITH DIFFERENTIAL/PLATELET
Abs Immature Granulocytes: 0 10*3/uL (ref 0.00–0.07)
Basophils Absolute: 0 10*3/uL (ref 0.0–0.1)
Basophils Relative: 0 %
Eosinophils Absolute: 0 10*3/uL (ref 0.0–0.5)
Eosinophils Relative: 0 %
HCT: 31.4 % — ABNORMAL LOW (ref 36.0–46.0)
Hemoglobin: 9.5 g/dL — ABNORMAL LOW (ref 12.0–15.0)
Lymphocytes Relative: 37 %
Lymphs Abs: 1.5 10*3/uL (ref 0.7–4.0)
MCH: 27.2 pg (ref 26.0–34.0)
MCHC: 30.3 g/dL (ref 30.0–36.0)
MCV: 90 fL (ref 80.0–100.0)
Monocytes Absolute: 0 10*3/uL — ABNORMAL LOW (ref 0.1–1.0)
Monocytes Relative: 0 %
Neutro Abs: 2.6 10*3/uL (ref 1.7–7.7)
Neutrophils Relative %: 63 %
Platelets: 162 10*3/uL (ref 150–400)
RBC: 3.49 MIL/uL — ABNORMAL LOW (ref 3.87–5.11)
RDW: 17.9 % — ABNORMAL HIGH (ref 11.5–15.5)
WBC: 4.1 10*3/uL (ref 4.0–10.5)
nRBC: 0 % (ref 0.0–0.2)
nRBC: 0 /100 WBC

## 2022-07-10 LAB — URINALYSIS, W/ REFLEX TO CULTURE (INFECTION SUSPECTED)
Bilirubin Urine: NEGATIVE
Glucose, UA: NEGATIVE mg/dL
Hgb urine dipstick: NEGATIVE
Ketones, ur: NEGATIVE mg/dL
Leukocytes,Ua: NEGATIVE
Nitrite: NEGATIVE
Protein, ur: NEGATIVE mg/dL
Specific Gravity, Urine: 1.014 (ref 1.005–1.030)
pH: 6 (ref 5.0–8.0)

## 2022-07-10 LAB — COMPREHENSIVE METABOLIC PANEL
ALT: 15 U/L (ref 0–44)
AST: 28 U/L (ref 15–41)
Albumin: 3.1 g/dL — ABNORMAL LOW (ref 3.5–5.0)
Alkaline Phosphatase: 85 U/L (ref 38–126)
Anion gap: 8 (ref 5–15)
BUN: 23 mg/dL (ref 8–23)
CO2: 22 mmol/L (ref 22–32)
Calcium: 11.1 mg/dL — ABNORMAL HIGH (ref 8.9–10.3)
Chloride: 107 mmol/L (ref 98–111)
Creatinine, Ser: 1.12 mg/dL — ABNORMAL HIGH (ref 0.44–1.00)
GFR, Estimated: 46 mL/min — ABNORMAL LOW (ref >60)
Glucose, Bld: 104 mg/dL — ABNORMAL HIGH (ref 70–99)
Potassium: 4.9 mmol/L (ref 3.5–5.1)
Sodium: 137 mmol/L (ref 135–145)
Total Bilirubin: 0.8 mg/dL (ref 0.3–1.2)
Total Protein: 9.6 g/dL — ABNORMAL HIGH (ref 6.5–8.1)

## 2022-07-10 MED ORDER — SODIUM CHLORIDE 0.9 % IV BOLUS
1000.0000 mL | Freq: Once | INTRAVENOUS | Status: AC
  Administered 2022-07-10: 1000 mL via INTRAVENOUS

## 2022-07-10 NOTE — Telephone Encounter (Signed)
See separate phone note from earlier in day. Terisa Starr, MD  Family Medicine Teaching Service

## 2022-07-10 NOTE — ED Notes (Signed)
Helped with getting patient cleaned up placed a brief help with the in and out cath to get urine from patient. Patient has call bell in reach and family at bedside got patient some warm blankets

## 2022-07-10 NOTE — ED Notes (Signed)
PT dislodged her IV from her hand per granddaughter

## 2022-07-10 NOTE — Discharge Instructions (Addendum)
She looks little dehydrated.  Her calcium was also a little high.

## 2022-07-10 NOTE — ED Triage Notes (Signed)
Pt bib ems from home. Per pt daughter pt has had increased weakness and decreased appetitie x 4 days. Pt has only been eating saltine crackers and splenda. Pt has hx of stroke with right sided deficits. Pt was seen on Monday by provider and negative for UTI. Pt is A&O x 2 at baseline and is A&O x 2 today. Pt is oriented to self and DOB. Pt denies any pain at this time.

## 2022-07-10 NOTE — ED Provider Notes (Signed)
[place] EMERGENCY DEPARTMENT AT [facility] Provider Note   CSN: [id number] Arrival date & time: 07/10/22  1203     History  Chief Complaint  Patient presents with   Weakness    Mary Walters is a 89 y.o. female.  The history is provided by the patient.  Weakness Patient with dementia.  Reportedly brought in for decreased exercise and more confusion.  Decreased oral intake.  Reportedly has had urinalysis done on did not have the results.  Reviewing notes it does appear she did have bacteria and white cells.  Reviewed urine culture from 5 days ago that did show mixed urogenital flora.  Reportedly has only been eating saltine crackers and Splenda.  History of previous stroke and is pleasant but does have some confusion with her dementia.  Chronic right-sided weakness.  Patient denies pain.     Home Medications Prior to Admission medications   Medication Sig Start Date End Date Taking? Authorizing Provider  acetaminophen (TYLENOL) 325 MG tablet Take 650 mg by mouth every 6 (six) hours as needed.    [provider]  amLODipine (NORVASC) 5 MG tablet TAKE 1 TABLET BY MOUTH EVERYDAY AT BEDTIME 12/25/21   Westley Chandler, MD  aspirin EC 81 MG tablet Take 81 mg by mouth at bedtime.    [provider]  atorvastatin (LIPITOR) 40 MG tablet Take 1 tablet (40 mg total) by mouth daily. 12/25/21   Westley Chandler, MD  Blood Glucose Monitoring Suppl (ONETOUCH VERIO FLEX SYSTEM) w/Device KIT Check once daily in the morning before breakfast Patient taking differently: 1 each by Other route daily. 06/11/18   Westley Chandler, MD  calcium-vitamin D [place] WITH D) 500-5 MG-MCG tablet Take 1 tablet by mouth daily with breakfast. 12/25/21   Westley Chandler, MD  candesartan (ATACAND) 8 MG tablet Take 1 tablet (8 mg total) by mouth daily. 12/25/21   Westley Chandler, MD  Cholecalciferol (VITAMIN D) 50 MCG (2000 UT) tablet Take 2,000 Units by mouth daily.    [provider]  diclofenac Sodium (VOLTAREN) 1 % GEL Apply 4 g topically 4 (four) times daily. 12/24/21   Westley Chandler, MD  glucose blood test strip Use as instructed Patient taking differently: 1 each by Other route See admin instructions. Use as instructed 06/11/18   Westley Chandler, MD  ketoconazole (NIZORAL) 2 % cream Apply 1 Application topically daily. To feet between toes 04/15/22   Westley Chandler, MD  Lancet Devices (ONE TOUCH DELICA LANCING DEV) MISC 1 Device by Does not apply route daily as needed. 06/02/17   Shon Hale, MD  OneTouch Delica Lancets 33G MISC 1 Units by Does not apply route daily as needed. 06/11/18   Westley Chandler, MD      Allergies    Patient has no known allergies.    Review of Systems   Review of Systems  Neurological:  Positive for weakness.    Physical Exam Updated Vital Signs BP 132/61   Pulse 84   Temp 98.7 F (37.1 C) (Rectal)   Resp 20   Ht 5' (1.524 m)   SpO2 100%   BMI 28.32 kg/m  Physical Exam Vitals and nursing note reviewed.  Cardiovascular:     Rate and Rhythm: Regular rhythm.  Abdominal:     Tenderness: There is no abdominal tenderness.  Musculoskeletal:     Cervical back: Neck supple.  Skin:    General: Skin is warm.  Neurological:     Mental Status: She is alert. Mental status is at baseline.     Comments: Chronic right-sided weakness.     ED Results / Procedures / Treatments   Labs (all labs ordered are listed, but only abnormal results are displayed) Labs Reviewed  COMPREHENSIVE METABOLIC PANEL - Abnormal; Notable for the following components:      Result Value   Glucose, Bld 104 (*)    Creatinine, Ser 1.12 (*)    Calcium 11.1 (*)    Total Protein 9.6 (*)    Albumin 3.1 (*)    GFR, Estimated 46 (*)    All other components within normal limits  CBC WITH DIFFERENTIAL/PLATELET - Abnormal; Notable for the following components:   RBC 3.49 (*)    Hemoglobin 9.5 (*)    HCT 31.4 (*)    RDW 17.9 (*)     Monocytes Absolute 0.0 (*)    All other components within normal limits  URINALYSIS, W/ REFLEX TO CULTURE (INFECTION SUSPECTED) - Abnormal; Notable for the following components:   APPearance HAZY (*)    Bacteria, UA MANY (*)    All other components within normal limits  PATHOLOGIST SMEAR REVIEW    EKG None  Radiology No results found.  Procedures Procedures    Medications Ordered in ED Medications  sodium chloride 0.9 % bolus 1,000 mL (0 mLs Intravenous Stopped 07/10/22 1509)    ED Course/ Medical Decision Making/ A&P                             Medical Decision Making Amount and/or Complexity of Data Reviewed Labs: ordered.   Patient with reported decreased oral intake and confusion.  Some baseline dementia.  Differential diagnose includes infection, dehydration, medication effect.  Will get basic blood work.  Will repeat urinalysis.  Will give fluid bolus.  Feeling better after fluid bolus.  Has tolerated some orals.  Mild anemia but is compared to 2 years ago.  Urine shows bacteria without white cells.  I think stable for discharge home.  Outpatient follow-up.  Discussed with patient's granddaughter.       Final Clinical Impression(s) / ED Diagnoses Final diagnoses:  Dehydration  Hypercalcemia    Rx / DC Orders ED Discharge Orders     None         Benjiman Core, MD 07/10/22 1625

## 2022-07-11 ENCOUNTER — Ambulatory Visit: Payer: Medicare HMO

## 2022-07-11 ENCOUNTER — Telehealth: Payer: Self-pay | Admitting: Family Medicine

## 2022-07-11 LAB — PATHOLOGIST SMEAR REVIEW: Path Review: NEGATIVE

## 2022-07-11 NOTE — Telephone Encounter (Signed)
Attempted to call [facility] [phone]). Their office has already closed.   Will attempt to call back at later time to provide verbal orders.   Veronda Prude, RN

## 2022-07-11 NOTE — Telephone Encounter (Signed)
Called daughter in law. Patient slightly improved, drinking well. Discussed anemia, repeat labs.    Nursing- please call [facility] and ask them to obtain CBC and BMP any day next week.  Terisa Starr, MD  Family Medicine Teaching Service

## 2022-07-12 DIAGNOSIS — Z7982 Long term (current) use of aspirin: Secondary | ICD-10-CM | POA: Diagnosis not present

## 2022-07-12 DIAGNOSIS — Z8616 Personal history of COVID-19: Secondary | ICD-10-CM | POA: Diagnosis not present

## 2022-07-12 DIAGNOSIS — E1140 Type 2 diabetes mellitus with diabetic neuropathy, unspecified: Secondary | ICD-10-CM | POA: Diagnosis not present

## 2022-07-12 DIAGNOSIS — F0150 Vascular dementia without behavioral disturbance: Secondary | ICD-10-CM | POA: Diagnosis not present

## 2022-07-12 DIAGNOSIS — E559 Vitamin D deficiency, unspecified: Secondary | ICD-10-CM | POA: Diagnosis not present

## 2022-07-12 DIAGNOSIS — I10 Essential (primary) hypertension: Secondary | ICD-10-CM | POA: Diagnosis not present

## 2022-07-12 DIAGNOSIS — N3946 Mixed incontinence: Secondary | ICD-10-CM | POA: Diagnosis not present

## 2022-07-12 DIAGNOSIS — I69318 Other symptoms and signs involving cognitive functions following cerebral infarction: Secondary | ICD-10-CM | POA: Diagnosis not present

## 2022-07-12 DIAGNOSIS — E1122 Type 2 diabetes mellitus with diabetic chronic kidney disease: Secondary | ICD-10-CM | POA: Diagnosis not present

## 2022-07-12 DIAGNOSIS — N183 Chronic kidney disease, stage 3 unspecified: Secondary | ICD-10-CM | POA: Diagnosis not present

## 2022-07-12 NOTE — Telephone Encounter (Signed)
Called [facility] and spoke with Darnel.  She reports patient was discharged by home nursing as of 6/14. She reports the nursing goals were complete.   Nursing can not go back to the home for "just labs."   She reports we can give a verbal order for nursing to reevaluate patients needs if she meets criteria or she will need to come her for lab work.   Will forward back to provider.

## 2022-07-12 NOTE — Telephone Encounter (Addendum)
Verbal order given to Darnel.

## 2022-07-12 NOTE — Telephone Encounter (Signed)
Please give verbal order for nursing to reevaluate.   Terisa Starr, MD  Family Medicine Teaching Service

## 2022-07-16 DIAGNOSIS — E1140 Type 2 diabetes mellitus with diabetic neuropathy, unspecified: Secondary | ICD-10-CM | POA: Diagnosis not present

## 2022-07-16 DIAGNOSIS — F0150 Vascular dementia without behavioral disturbance: Secondary | ICD-10-CM | POA: Diagnosis not present

## 2022-07-16 DIAGNOSIS — E559 Vitamin D deficiency, unspecified: Secondary | ICD-10-CM | POA: Diagnosis not present

## 2022-07-16 DIAGNOSIS — I10 Essential (primary) hypertension: Secondary | ICD-10-CM | POA: Diagnosis not present

## 2022-07-16 DIAGNOSIS — N183 Chronic kidney disease, stage 3 unspecified: Secondary | ICD-10-CM | POA: Diagnosis not present

## 2022-07-16 DIAGNOSIS — N3946 Mixed incontinence: Secondary | ICD-10-CM | POA: Diagnosis not present

## 2022-07-16 DIAGNOSIS — I69318 Other symptoms and signs involving cognitive functions following cerebral infarction: Secondary | ICD-10-CM | POA: Diagnosis not present

## 2022-07-16 DIAGNOSIS — E1122 Type 2 diabetes mellitus with diabetic chronic kidney disease: Secondary | ICD-10-CM | POA: Diagnosis not present

## 2022-07-16 DIAGNOSIS — Z8616 Personal history of COVID-19: Secondary | ICD-10-CM | POA: Diagnosis not present

## 2022-07-16 DIAGNOSIS — Z7982 Long term (current) use of aspirin: Secondary | ICD-10-CM | POA: Diagnosis not present

## 2022-07-17 DIAGNOSIS — E1122 Type 2 diabetes mellitus with diabetic chronic kidney disease: Secondary | ICD-10-CM | POA: Diagnosis not present

## 2022-07-17 DIAGNOSIS — F0150 Vascular dementia without behavioral disturbance: Secondary | ICD-10-CM | POA: Diagnosis not present

## 2022-07-17 DIAGNOSIS — Z8616 Personal history of COVID-19: Secondary | ICD-10-CM | POA: Diagnosis not present

## 2022-07-17 DIAGNOSIS — E1140 Type 2 diabetes mellitus with diabetic neuropathy, unspecified: Secondary | ICD-10-CM | POA: Diagnosis not present

## 2022-07-17 DIAGNOSIS — Z7982 Long term (current) use of aspirin: Secondary | ICD-10-CM | POA: Diagnosis not present

## 2022-07-17 DIAGNOSIS — I69318 Other symptoms and signs involving cognitive functions following cerebral infarction: Secondary | ICD-10-CM | POA: Diagnosis not present

## 2022-07-17 DIAGNOSIS — I10 Essential (primary) hypertension: Secondary | ICD-10-CM | POA: Diagnosis not present

## 2022-07-17 DIAGNOSIS — E559 Vitamin D deficiency, unspecified: Secondary | ICD-10-CM | POA: Diagnosis not present

## 2022-07-17 DIAGNOSIS — N183 Chronic kidney disease, stage 3 unspecified: Secondary | ICD-10-CM | POA: Diagnosis not present

## 2022-07-17 DIAGNOSIS — N3946 Mixed incontinence: Secondary | ICD-10-CM | POA: Diagnosis not present

## 2022-07-25 ENCOUNTER — Telehealth: Payer: Self-pay

## 2022-07-25 DIAGNOSIS — I10 Essential (primary) hypertension: Secondary | ICD-10-CM | POA: Diagnosis not present

## 2022-07-25 DIAGNOSIS — E1122 Type 2 diabetes mellitus with diabetic chronic kidney disease: Secondary | ICD-10-CM | POA: Diagnosis not present

## 2022-07-25 DIAGNOSIS — I69318 Other symptoms and signs involving cognitive functions following cerebral infarction: Secondary | ICD-10-CM | POA: Diagnosis not present

## 2022-07-25 DIAGNOSIS — F0150 Vascular dementia without behavioral disturbance: Secondary | ICD-10-CM | POA: Diagnosis not present

## 2022-07-25 DIAGNOSIS — E1140 Type 2 diabetes mellitus with diabetic neuropathy, unspecified: Secondary | ICD-10-CM | POA: Diagnosis not present

## 2022-07-25 DIAGNOSIS — N183 Chronic kidney disease, stage 3 unspecified: Secondary | ICD-10-CM | POA: Diagnosis not present

## 2022-07-25 DIAGNOSIS — Z8616 Personal history of COVID-19: Secondary | ICD-10-CM | POA: Diagnosis not present

## 2022-07-25 DIAGNOSIS — Z7982 Long term (current) use of aspirin: Secondary | ICD-10-CM | POA: Diagnosis not present

## 2022-07-25 DIAGNOSIS — E559 Vitamin D deficiency, unspecified: Secondary | ICD-10-CM | POA: Diagnosis not present

## 2022-07-25 DIAGNOSIS — F01B0 Vascular dementia, moderate, without behavioral disturbance, psychotic disturbance, mood disturbance, and anxiety: Secondary | ICD-10-CM

## 2022-07-25 DIAGNOSIS — N3946 Mixed incontinence: Secondary | ICD-10-CM | POA: Diagnosis not present

## 2022-07-25 NOTE — Telephone Encounter (Signed)
Pls notify daughter that have ordered a referral to palliative care  Thanks  LC

## 2022-07-25 NOTE — Telephone Encounter (Signed)
Received call from patient's daughter in law, Maralyn Sago.   She reports that PT was asking about Palliative care. PT was suggesting palliative care due to patient's decreased oral intake.   Please place new order for palliative care services if appropriate.   *[facility] cannot take verbal orders for palliative. New order will need to be placed. Thanks.

## 2022-07-26 NOTE — Telephone Encounter (Signed)
LVM detailed informing that a referral was placed for palliative care, any questions or concerns to call our office. Penni Bombard CMA

## 2022-07-30 ENCOUNTER — Emergency Department (HOSPITAL_COMMUNITY): Payer: Medicare HMO

## 2022-07-30 ENCOUNTER — Other Ambulatory Visit: Payer: Self-pay

## 2022-07-30 ENCOUNTER — Observation Stay (HOSPITAL_COMMUNITY)
Admission: EM | Admit: 2022-07-30 | Discharge: 2022-08-03 | Disposition: A | Discharge status: Hospice | Payer: Medicare HMO | Attending: Family Medicine | Admitting: Family Medicine

## 2022-07-30 ENCOUNTER — Telehealth: Payer: Self-pay | Admitting: Family Medicine

## 2022-07-30 ENCOUNTER — Encounter (HOSPITAL_COMMUNITY): Payer: Self-pay

## 2022-07-30 DIAGNOSIS — L89302 Pressure ulcer of unspecified buttock, stage 2: Secondary | ICD-10-CM | POA: Diagnosis present

## 2022-07-30 DIAGNOSIS — L89152 Pressure ulcer of sacral region, stage 2: Secondary | ICD-10-CM | POA: Diagnosis present

## 2022-07-30 DIAGNOSIS — I129 Hypertensive chronic kidney disease with stage 1 through stage 4 chronic kidney disease, or unspecified chronic kidney disease: Secondary | ICD-10-CM | POA: Diagnosis not present

## 2022-07-30 DIAGNOSIS — I2510 Atherosclerotic heart disease of native coronary artery without angina pectoris: Secondary | ICD-10-CM | POA: Diagnosis not present

## 2022-07-30 DIAGNOSIS — R911 Solitary pulmonary nodule: Secondary | ICD-10-CM | POA: Insufficient documentation

## 2022-07-30 DIAGNOSIS — N3000 Acute cystitis without hematuria: Secondary | ICD-10-CM | POA: Diagnosis not present

## 2022-07-30 DIAGNOSIS — N179 Acute kidney failure, unspecified: Secondary | ICD-10-CM | POA: Diagnosis not present

## 2022-07-30 DIAGNOSIS — Z0389 Encounter for observation for other suspected diseases and conditions ruled out: Secondary | ICD-10-CM | POA: Diagnosis not present

## 2022-07-30 DIAGNOSIS — K449 Diaphragmatic hernia without obstruction or gangrene: Secondary | ICD-10-CM | POA: Diagnosis not present

## 2022-07-30 DIAGNOSIS — I10 Essential (primary) hypertension: Secondary | ICD-10-CM | POA: Diagnosis not present

## 2022-07-30 DIAGNOSIS — R634 Abnormal weight loss: Secondary | ICD-10-CM | POA: Diagnosis not present

## 2022-07-30 DIAGNOSIS — R4182 Altered mental status, unspecified: Secondary | ICD-10-CM | POA: Diagnosis not present

## 2022-07-30 DIAGNOSIS — Z8673 Personal history of transient ischemic attack (TIA), and cerebral infarction without residual deficits: Secondary | ICD-10-CM | POA: Insufficient documentation

## 2022-07-30 DIAGNOSIS — Z79899 Other long term (current) drug therapy: Secondary | ICD-10-CM | POA: Insufficient documentation

## 2022-07-30 DIAGNOSIS — F0390 Unspecified dementia without behavioral disturbance: Secondary | ICD-10-CM | POA: Diagnosis not present

## 2022-07-30 DIAGNOSIS — I700 Atherosclerosis of aorta: Secondary | ICD-10-CM | POA: Insufficient documentation

## 2022-07-30 DIAGNOSIS — K5641 Fecal impaction: Secondary | ICD-10-CM | POA: Diagnosis present

## 2022-07-30 DIAGNOSIS — R410 Disorientation, unspecified: Secondary | ICD-10-CM | POA: Diagnosis not present

## 2022-07-30 DIAGNOSIS — E860 Dehydration: Secondary | ICD-10-CM | POA: Diagnosis present

## 2022-07-30 DIAGNOSIS — N182 Chronic kidney disease, stage 2 (mild): Secondary | ICD-10-CM | POA: Diagnosis not present

## 2022-07-30 DIAGNOSIS — Z7982 Long term (current) use of aspirin: Secondary | ICD-10-CM | POA: Diagnosis not present

## 2022-07-30 DIAGNOSIS — R531 Weakness: Secondary | ICD-10-CM | POA: Diagnosis not present

## 2022-07-30 DIAGNOSIS — E1122 Type 2 diabetes mellitus with diabetic chronic kidney disease: Secondary | ICD-10-CM | POA: Diagnosis not present

## 2022-07-30 DIAGNOSIS — N281 Cyst of kidney, acquired: Secondary | ICD-10-CM | POA: Diagnosis not present

## 2022-07-30 DIAGNOSIS — E870 Hyperosmolality and hypernatremia: Secondary | ICD-10-CM | POA: Diagnosis not present

## 2022-07-30 DIAGNOSIS — R633 Feeding difficulties, unspecified: Secondary | ICD-10-CM | POA: Insufficient documentation

## 2022-07-30 DIAGNOSIS — R627 Adult failure to thrive: Principal | ICD-10-CM | POA: Diagnosis present

## 2022-07-30 DIAGNOSIS — R54 Age-related physical debility: Secondary | ICD-10-CM | POA: Diagnosis present

## 2022-07-30 LAB — COMPREHENSIVE METABOLIC PANEL
ALT: 17 U/L (ref 0–44)
AST: 34 U/L (ref 15–41)
Albumin: 3.3 g/dL — ABNORMAL LOW (ref 3.5–5.0)
Alkaline Phosphatase: 83 U/L (ref 38–126)
Anion gap: 9 (ref 5–15)
BUN: 70 mg/dL — ABNORMAL HIGH (ref 8–23)
CO2: 22 mmol/L (ref 22–32)
Calcium: 12.3 mg/dL — ABNORMAL HIGH (ref 8.9–10.3)
Chloride: 111 mmol/L (ref 98–111)
Creatinine, Ser: 1.61 mg/dL — ABNORMAL HIGH (ref 0.44–1.00)
GFR, Estimated: 30 mL/min — ABNORMAL LOW (ref >60)
Glucose, Bld: 129 mg/dL — ABNORMAL HIGH (ref 70–99)
Potassium: 4.8 mmol/L (ref 3.5–5.1)
Sodium: 142 mmol/L (ref 135–145)
Total Bilirubin: 1 mg/dL (ref 0.3–1.2)
Total Protein: 10.6 g/dL — ABNORMAL HIGH (ref 6.5–8.1)

## 2022-07-30 LAB — TSH: TSH: 0.927 u[IU]/mL (ref 0.350–4.500)

## 2022-07-30 LAB — CBC WITH DIFFERENTIAL/PLATELET
Abs Immature Granulocytes: 0.02 10*3/uL (ref 0.00–0.07)
Basophils Absolute: 0 10*3/uL (ref 0.0–0.1)
Basophils Relative: 0 %
Eosinophils Absolute: 0 10*3/uL (ref 0.0–0.5)
Eosinophils Relative: 0 %
HCT: 31 % — ABNORMAL LOW (ref 36.0–46.0)
Hemoglobin: 9.4 g/dL — ABNORMAL LOW (ref 12.0–15.0)
Immature Granulocytes: 0 %
Lymphocytes Relative: 33 %
Lymphs Abs: 2.1 10*3/uL (ref 0.7–4.0)
MCH: 26.8 pg (ref 26.0–34.0)
MCHC: 30.3 g/dL (ref 30.0–36.0)
MCV: 88.3 fL (ref 80.0–100.0)
Monocytes Absolute: 0.7 10*3/uL (ref 0.1–1.0)
Monocytes Relative: 11 %
Neutro Abs: 3.5 10*3/uL (ref 1.7–7.7)
Neutrophils Relative %: 56 %
Platelets: 172 10*3/uL (ref 150–400)
RBC: 3.51 MIL/uL — ABNORMAL LOW (ref 3.87–5.11)
RDW: 18.2 % — ABNORMAL HIGH (ref 11.5–15.5)
Smear Review: NORMAL
WBC: 6.3 10*3/uL (ref 4.0–10.5)
nRBC: 0 % (ref 0.0–0.2)

## 2022-07-30 LAB — URINALYSIS, W/ REFLEX TO CULTURE (INFECTION SUSPECTED)
Bilirubin Urine: NEGATIVE
Glucose, UA: NEGATIVE mg/dL
Hgb urine dipstick: NEGATIVE
Ketones, ur: NEGATIVE mg/dL
Nitrite: NEGATIVE
Protein, ur: NEGATIVE mg/dL
Specific Gravity, Urine: 1.015 (ref 1.005–1.030)
pH: 5 (ref 5.0–8.0)

## 2022-07-30 LAB — I-STAT CHEM 8, ED
BUN: 66 mg/dL — ABNORMAL HIGH (ref 8–23)
Calcium, Ion: 1.48 mmol/L — ABNORMAL HIGH (ref 1.15–1.40)
Chloride: 117 mmol/L — ABNORMAL HIGH (ref 98–111)
Creatinine, Ser: 1.7 mg/dL — ABNORMAL HIGH (ref 0.44–1.00)
Glucose, Bld: 141 mg/dL — ABNORMAL HIGH (ref 70–99)
HCT: 29 % — ABNORMAL LOW (ref 36.0–46.0)
Hemoglobin: 9.9 g/dL — ABNORMAL LOW (ref 12.0–15.0)
Potassium: 4.4 mmol/L (ref 3.5–5.1)
Sodium: 146 mmol/L — ABNORMAL HIGH (ref 135–145)
TCO2: 23 mmol/L (ref 22–32)

## 2022-07-30 LAB — MAGNESIUM: Magnesium: 1.8 mg/dL (ref 1.7–2.4)

## 2022-07-30 LAB — TROPONIN I (HIGH SENSITIVITY): Troponin I (High Sensitivity): 29 ng/L — ABNORMAL HIGH (ref <18)

## 2022-07-30 LAB — LACTIC ACID, PLASMA: Lactic Acid, Venous: 1.5 mmol/L (ref 0.5–1.9)

## 2022-07-30 MED ORDER — SODIUM CHLORIDE 0.9 % IV SOLN: 1.0000 g | Freq: Once | INTRAVENOUS | Status: DC

## 2022-07-30 MED ORDER — HYDROMORPHONE HCL 1 MG/ML IJ SOLN: 0.5000 mg | INTRAMUSCULAR | Status: DC | PRN

## 2022-07-30 MED ORDER — LACTATED RINGERS IV BOLUS (SEPSIS)
1000.0000 mL | Freq: Once | INTRAVENOUS | Status: AC
  Administered 2022-07-30: 1000 mL via INTRAVENOUS

## 2022-07-30 MED ORDER — OXYCODONE HCL 5 MG/5ML PO SOLN: 5.0000 mg | ORAL | Status: DC | PRN

## 2022-07-30 NOTE — Progress Notes (Addendum)
FMTS Interim Progress Note  S: Went to see patient at bedside with Dr. Laroy Apple. Patient was laying in bed comfortably watching TV. She thought it was time to leave, but was re-oriented to being in the hospital.  She was very pleasant.  O: BP (!) 143/80 (BP Location: Left Arm)   Pulse 80   Temp 98.3 F (36.8 C) (Oral)   Resp 16   SpO2 100%   General: Pleasant woman, awake in NAD. Laying comfortably in bed.  Respiratory: normal WOB on RA Psych: alert, normal mood  A/P: Failure to Thrive vs. Dehydration vs. Dementia - Family declined IVF for her AKI with goals of allowing events to progress naturally - Palliative consulted - GOC conversations in AM - 5 mg Oxy solu Q4H PRN for pain overnight  - Fall and Delirium precautions on board   Fortunato Curling, DO 07/30/2022, 9:08 PM PGY-1, [facility] Family Medicine Service pager [phone]

## 2022-07-30 NOTE — H&P (Signed)
Hospital Admission History and Physical Service Pager: [phone]  Patient name: Mary Walters Medical record number: [id number] Date of Birth: October 03, 1928 Age: 89 y.o. Gender: female  Primary Care Provider: Fortunato Curling, DO Consultants: Palliative Care  Code Status: DNR which was confirmed with family if patient unable to confirm.  Preferred Emergency Contact:  Contact Information   None on File    Other Contacts     Name Relation Home Work Mobile   [place] Daughter   [phone]   [facility] Granddaughter   [phone]   Jannell, Franta   [phone]        Chief Complaint: Failure to Thrive   Assessment and Plan: Mary Walters is a 89 y.o. female presenting with FTT in the setting of advanced dementia. Differential for presentation of this includes advancing dementia, AKI, infection.   Spoke with daughter at bedside regarding GOC for this admission. She reports Mrs. Mary Walters was very clear in her wishes to avoid resuscitation, oxygen, and invasive procedures.  Correspondingly, they do not wish to start IVF to correct apparent AKI on admission and opted for a natural approach to avoid her being connected to an IV.   Called palliative care NP, Raynelle Fanning, to discuss comfort medications to use overnight. We will order 5 mg oxycodone solution Q4H PRN for breakthrough pain.  Can consider adding on antiemetic but will need to use with caution with prolonged QTc of 510.  They also recommended not starting IV fluids as this may be difficult for families to discontinue once started.  Palliative plans to see them tomorrow to continue GOC discussion. Also ordered SLP consult to help with a safe feeding plan.   [facility]     * (Principal) Failure to thrive in adult     - Admit to FMTS, attending Dr. Lum Babe  - Med-surg, Vital signs per floor - Palliative care consulted, appreciate recommendations  - Continue GOC conversations  - Soft diet, pending SLP  recs - VTE prophylaxis SCDs - Fall precautions - Delirium precautions - 5 mg Oxy solu Q4H PRN for pain overnight         AKI (acute kidney injury) (HCC)     Creatinine elevated at 1.7.  Baseline appears to be around 1.1.   Discussed options for management with family and they have elected to not  start IV fluids at this time and continue to let things progress  naturally.  Will continue goals of care discussion with palliative care  tomorrow morning        Altered mental status     Poor feeding    FEN/GI: soft diet, pending SLP recs VTE Prophylaxis: SCDs  Disposition: med-surg  History of Present Illness:  Mary Walters is a 89 y.o. female presenting with failure to thrive.  Her daughter who has been her primary caregiver for the last 40 years is present at bedside to provide history.  She reports her mother has been steadily declining with known vascular dementia up in the last week has been mostly bedbound and has refused to eat.  She reports she is not talking as much as she normally does and has been very quiet at home.  They are bringing her in to have a palliative care consult to discuss goals of care.  They report Ms. Jeraline having very clear wishes to avoid resuscitation, oxygen, and advanced procedures.  They deny nausea, vomiting.  Port she does not complain of pain at  rest but will cry out in pain if her body is touched.  In the ED, her vital signs were stable.  CXR, CT head chest abdomen pelvis were obtained. Labs showed WBC 6.3, Hgb 9.9, Cr 1.7, Na 146. Admitted to FMTS for GOC discussion and Palliative care consult.   Review Of Systems: Per HPI with the following additions: as above   Pertinent Past Medical History: Diabetes Hyperlipidemia Hypertension History of stroke 2019 Vascular dementia Remainder reviewed in history tab.   Pertinent Past Surgical History: No past surgical history Remainder reviewed in history tab.   Pertinent Social  History: Tobacco use: No Alcohol use: Denies Other Substance use: Denies Lives with daughter  Pertinent Family History: Mother: Hypertension Remainder reviewed in history tab.   Important Outpatient Medications: Tylenol Aspirin Lipitor 40 mg Amlodipine 5 mg Vitamin D 2000 iu Voltaren gel Robitussin Candesartan 8 mg Remainder reviewed in medication history.   Objective: BP (!) 143/80 (BP Location: Left Arm)   Pulse 80   Temp 98.3 F (36.8 C) (Oral)   Resp 16   SpO2 100%  Exam: General: Cachectic, chronically ill-appearing Cardiovascular: Regular rate, regular rhythm, no murmurs on exam Respiratory: Clear, no increased work of breathing Gastrointestinal: Soft nondistended MSK: Atrophied extremities Psych: Alert, tracks movement with eyes, speaks in few word sentences  Labs:  CBC BMET  Recent Labs  Lab 07/30/22 1240 07/30/22 1254  WBC 6.3  --   HGB 9.4* 9.9*  HCT 31.0* 29.0*  PLT 172  --    Recent Labs  Lab 07/30/22 1240 07/30/22 1254  NA 142 146*  K 4.8 4.4  CL 111 117*  CO2 22  --   BUN 70* 66*  CREATININE 1.61* 1.70*  GLUCOSE 129* 141*  CALCIUM 12.3*  --     Pertinent additional labs UA negative for infection, urine culture pending, TSH WNL.   EKG: Sinus rhythm, normal axis without deviation, QTc 510   Imaging Studies Performed:  CXR: Hazy left costophrenic angle, bilateral interstitial opacity  CT head without contrast: No acute intracranial abnormality Unchanged severe chronic small vessel disease and generalized volume loss  CT chest abdomen pelvis without contrast: Multiple rounded low densities are noted in the hepatic parenchyma most consistent with cysts. 4 mm nodule noted in right lower lobe. No follow-up needed if patient is low-risk.This recommendation follows the consensus statement: Guidelines for Management of Incidental Pulmonary Nodules Detected on CT Images: From the Fleischner Society 2017; Radiology 2017;  284:228-243. Small sliding-type hiatal hernia. Coronary artery calcifications are noted suggesting coronary artery disease. Large amount of stool is noted in rectum concerning for impaction. Multilevel degenerative disc disease is noted in the lumbar spine.   Glendale Chard, DO 07/30/2022, 6:47 PM PGY-2, [facility]  FPTS Intern pager: [phone], text pages welcome Secure chat group [facility] Teaching Service

## 2022-07-30 NOTE — ED Triage Notes (Signed)
Pt BIB EMS for AMS and weakness to thrive for 3 days. Signif medical hx. VSS.

## 2022-07-30 NOTE — Telephone Encounter (Signed)
Called caregiver and daughter in law--[name]. Patient is not taking much PO if any each day. She is weak and [name] is having a very hard time changing her and caring for her--worried she is going to fall. Discussed that this could be acute dehydration (patient in room in former garage, very hot) vs. Progression of dementia. [name] very aware that patient's dementia likely progressing and she may be 'nearing the end'. Requests I call her son. Spoke with Son Mr. Billiejo Sorto, decision maker. He does see decline. Discussed recommendations for another ED visit for further evaluation and admission to [facility] Medicine Service (Page [phone]). Also discussed at length progression of dementia, involvement of palliative care, and likelihood that ultimately this is her dementia progressing. He is also aware of her wishes and would not want aggressive interventions. He does feel his wife is unable to care for her at home in current state and is worried about both of them falling. Plan for ED evaluation and admission to [facility] medicine. Please page [phone] once labs and imaging complete in ED. Terisa Starr, MD  Family Medicine Teaching Service

## 2022-07-30 NOTE — Assessment & Plan Note (Signed)
Creatinine elevated at 1.7.  Baseline appears to be around 1.1.  Discussed options for management with family and they have elected to not start IV fluids at this time and continue to let things progress naturally.  Will continue goals of care discussion with palliative care tomorrow morning

## 2022-07-30 NOTE — ED Notes (Signed)
ED TO INPATIENT HANDOFF REPORT  ED Nurse Name and Phone #: Tori [phone]  S Name/Age/Gender Mary Walters 89 y.o. female Room/Bed: RESUSC/RESUSC  Code Status   Code Status: DNR  Home/SNF/Other Home Patient oriented to: self Is this baseline? Yes   Triage Complete: Triage complete  Chief Complaint Failure to thrive in adult [R62.7]  Triage Note Pt BIB EMS for AMS and weakness to thrive for 3 days. Signif medical hx. VSS.   Allergies No Known Allergies  Level of Care/Admitting Diagnosis ED Disposition     ED Disposition  Admit   Condition  --   Comment  Hospital Area: [facility] [[id number]]  Level of Care: Med-Surg [16]  May place patient in observation at [facility] or [facility] if equivalent level of care is available:: Yes  Covid Evaluation: Asymptomatic - no recent exposure (last 10 days) testing not required  Diagnosis: Failure to thrive in adult [358490]  Admitting Physician: Glendale Chard [[id number]]  Attending Physician: Doreene Eland [[id number]]          B Medical/Surgery History Past Medical History:  Diagnosis Date   Diabetes mellitus without complication (HCC)    HLD (hyperlipidemia) 06/02/2017   Hypertension    Stroke (cerebrum) (HCC) 05/14/2017   Urge incontinence 09/05/2017   Vascular dementia of acute onset without behavioral disturbance (HCC) 09/05/2017   History reviewed. No pertinent surgical history.   A IV Location/Drains/Wounds Patient Lines/Drains/Airways Status     Active Line/Drains/Airways     Name Placement date Placement time Site Days   Peripheral IV 07/30/22 20 G Anterior;Right Forearm 07/30/22  1247  Forearm  less than 1   Peripheral IV 07/30/22 20 G 1" Anterior;Proximal;Right Forearm 07/30/22  1315  Forearm  less than 1            Intake/Output Last 24 hours No intake or output data in the 24 hours ending 07/30/22 1551  Labs/Imaging Results for orders placed or performed during the hospital  encounter of 07/30/22 (from the past 48 hour(s))  Comprehensive metabolic panel     Status: Abnormal   Collection Time: 07/30/22 12:40 PM  Result Value Ref Range   Sodium 142 135 - 145 mmol/L   Potassium 4.8 3.5 - 5.1 mmol/L    Comment: HEMOLYSIS AT THIS LEVEL MAY AFFECT RESULT   Chloride 111 98 - 111 mmol/L   CO2 22 22 - 32 mmol/L   Glucose, Bld 129 (H) 70 - 99 mg/dL    Comment: Glucose reference range applies only to samples taken after fasting for at least 8 hours.   BUN 70 (H) 8 - 23 mg/dL   Creatinine, Ser 0.86 (H) 0.44 - 1.00 mg/dL   Calcium 57.8 (H) 8.9 - 10.3 mg/dL   Total Protein 46.9 (H) 6.5 - 8.1 g/dL   Albumin 3.3 (L) 3.5 - 5.0 g/dL   AST 34 15 - 41 U/L    Comment: HEMOLYSIS AT THIS LEVEL MAY AFFECT RESULT   ALT 17 0 - 44 U/L    Comment: HEMOLYSIS AT THIS LEVEL MAY AFFECT RESULT   Alkaline Phosphatase 83 38 - 126 U/L   Total Bilirubin 1.0 0.3 - 1.2 mg/dL    Comment: HEMOLYSIS AT THIS LEVEL MAY AFFECT RESULT   GFR, Estimated 30 (L) >60 mL/min    Comment: (NOTE) Calculated using the CKD-EPI Creatinine Equation (2021)    Anion gap 9 5 - 15    Comment: Performed at [facility], [address], [place], [place] [address]  CBC with Differential     Status: Abnormal   Collection Time: 07/30/22 12:40 PM  Result Value Ref Range   WBC 6.3 4.0 - 10.5 K/uL   RBC 3.51 (L) 3.87 - 5.11 MIL/uL   Hemoglobin 9.4 (L) 12.0 - 15.0 g/dL   HCT 86.5 (L) 78.4 - 69.6 %   MCV 88.3 80.0 - 100.0 fL   MCH 26.8 26.0 - 34.0 pg   MCHC 30.3 30.0 - 36.0 g/dL   RDW 29.5 (H) 28.4 - 13.2 %   Platelets 172 150 - 400 K/uL   nRBC 0.0 0.0 - 0.2 %   Neutrophils Relative % 56 %   Neutro Abs 3.5 1.7 - 7.7 K/uL   Lymphocytes Relative 33 %   Lymphs Abs 2.1 0.7 - 4.0 K/uL   Monocytes Relative 11 %   Monocytes Absolute 0.7 0.1 - 1.0 K/uL   Eosinophils Relative 0 %   Eosinophils Absolute 0.0 0.0 - 0.5 K/uL   Basophils Relative 0 %   Basophils Absolute 0.0 0.0 - 0.1 K/uL   WBC Morphology  MORPHOLOGY UNREMARKABLE    RBC Morphology MORPHOLOGY UNREMARKABLE    Smear Review Normal platelet morphology    Immature Granulocytes 0 %   Abs Immature Granulocytes 0.02 0.00 - 0.07 K/uL    Comment: Performed at [facility], [address], [place], [place] [address]  Troponin I (High Sensitivity)     Status: Abnormal   Collection Time: 07/30/22 12:40 PM  Result Value Ref Range   Troponin I (High Sensitivity) 29 (H) <18 ng/L    Comment: (NOTE) Elevated high sensitivity troponin I (hsTnI) values and significant  changes across serial measurements may suggest ACS but many other  chronic and acute conditions are known to elevate hsTnI results.  Refer to the "Links" section for chest pain algorithms and additional  guidance. Performed at [facility], [address], [place], [place] [address]   Magnesium     Status: None   Collection Time: 07/30/22 12:40 PM  Result Value Ref Range   Magnesium 1.8 1.7 - 2.4 mg/dL    Comment: Performed at [facility], [address] [place], [place] [address]  Lactic acid, plasma     Status: None   Collection Time: 07/30/22 12:40 PM  Result Value Ref Range   Lactic Acid, Venous 1.5 0.5 - 1.9 mmol/L    Comment: Performed at [facility], [address] [place], [place] [address]  TSH     Status: None   Collection Time: 07/30/22 12:40 PM  Result Value Ref Range   TSH 0.927 0.350 - 4.500 uIU/mL    Comment: Performed by a 3rd Generation assay with a functional sensitivity of <=0.01 uIU/mL. Performed at [facility], [address] [place], [place] [address]   I-Stat Chem 8, ED     Status: Abnormal   Collection Time: 07/30/22 12:54 PM  Result Value Ref Range   Sodium 146 (H) 135 - 145 mmol/L   Potassium 4.4 3.5 - 5.1 mmol/L   Chloride 117 (H) 98 - 111 mmol/L   BUN 66 (H) 8 - 23 mg/dL   Creatinine, Ser 8.75 (H) 0.44 - 1.00 mg/dL   Glucose, Bld 643 (H) 70 - 99 mg/dL    Comment: Glucose reference range applies only  to samples taken after fasting for at least 8 hours.   Calcium, Ion 1.48 (H) 1.15 - 1.40  mmol/L   TCO2 23 22 - 32 mmol/L   Hemoglobin 9.9 (L) 12.0 - 15.0 g/dL   HCT 86.5 (L) 78.4 - 69.6 %  Urinalysis, w/ Reflex to Culture (Infection Suspected) -Urine, Catheterized     Status: Abnormal   Collection Time: 07/30/22  2:19 PM  Result Value Ref Range   Specimen Source URINE, CATHETERIZED    Color, Urine YELLOW YELLOW   APPearance HAZY (A) CLEAR   Specific Gravity, Urine 1.015 1.005 - 1.030   pH 5.0 5.0 - 8.0   Glucose, UA NEGATIVE NEGATIVE mg/dL   Hgb urine dipstick NEGATIVE NEGATIVE   Bilirubin Urine NEGATIVE NEGATIVE   Ketones, ur NEGATIVE NEGATIVE mg/dL   Protein, ur NEGATIVE NEGATIVE mg/dL   Nitrite NEGATIVE NEGATIVE   Leukocytes,Ua SMALL (A) NEGATIVE   RBC / HPF 0-5 0 - 5 RBC/hpf   WBC, UA 21-50 0 - 5 WBC/hpf    Comment:        Reflex urine culture not performed if WBC <=10, OR if Squamous epithelial cells >5. If Squamous epithelial cells >5 suggest recollection.    Bacteria, UA MANY (A) NONE SEEN   Squamous Epithelial / HPF 0-5 0 - 5 /HPF   Mucus PRESENT     Comment: Performed at [facility], [address], [place], [place] [address]   DG Chest Port 1 View  Result Date: 07/30/2022 CLINICAL DATA:  Questionable sepsis - evaluate for abnormality EXAM: PORTABLE CHEST 1 VIEW COMPARISON:  CXR 07/04/20 FINDINGS: No pleural effusion. No pneumothorax. Unchanged cardiac and mediastinal contours. No focal airspace opacity. There are prominent bilateral interstitial opacities that could represent pulmonary venous congestion or atypical infection. No radiographically apparent displaced rib fractures. Visualized upper abdomen is unremarkable. Degenerative changes of the bilateral glenohumeral and AC joints. IMPRESSION: There are prominent bilateral interstitial opacities that could represent pulmonary venous congestion or atypical infection. Electronically Signed   By: Lorenza Cambridge M.D.    On: 07/30/2022 14:58   CT CHEST ABDOMEN PELVIS WO CONTRAST  Result Date: 07/30/2022 CLINICAL DATA:  Unintentional weight loss. EXAM: CT CHEST, ABDOMEN AND PELVIS WITHOUT CONTRAST TECHNIQUE: Multidetector CT imaging of the chest, abdomen and pelvis was performed following the standard protocol without IV contrast. RADIATION DOSE REDUCTION: This exam was performed according to the departmental dose-optimization program which includes automated exposure control, adjustment of the mA and/or kV according to patient size and/or use of iterative reconstruction technique. COMPARISON:  None Available. FINDINGS: CT CHEST FINDINGS Cardiovascular: Atherosclerosis of thoracic aorta without aneurysm formation. Normal cardiac size. No pericardial effusion. Coronary artery calcifications are noted. Mediastinum/Nodes: Small sliding-type hiatal hernia. No adenopathy. Thyroid gland is unremarkable. Lungs/Pleura: No pneumothorax or pleural effusion is noted. Minimal bibasilar subsegmental atelectasis or scarring is noted. 4 mm nodule is noted in right lower lobe best seen on image number 69 of series 5. Musculoskeletal: No chest wall mass or suspicious bone lesions identified. CT ABDOMEN PELVIS FINDINGS Hepatobiliary: No cholelithiasis or biliary dilatation is noted. Multiple rounded low densities are noted in the hepatic parenchyma most consistent with cysts. The largest measures 3.2 cm in posterior segment of right hepatic lobe. Pancreas: Unremarkable. No pancreatic ductal dilatation or surrounding inflammatory changes. Spleen: Normal in size without focal abnormality. Adrenals/Urinary Tract: Adrenal glands appear normal. Bilateral renal cysts are noted for which no further follow-up is required. No hydronephrosis or renal obstruction is noted. Urinary bladder is unremarkable. Stomach/Bowel: Stomach is within normal limits. Appendix appears normal. No evidence of bowel wall thickening, distention,  or inflammatory changes. Large  amount of stool is noted in the rectum. Vascular/Lymphatic: Aortic atherosclerosis. No enlarged abdominal or pelvic lymph nodes. Reproductive: Uterus and bilateral adnexa are unremarkable. Other: No abdominal wall hernia or abnormality. No abdominopelvic ascites. Musculoskeletal: Multilevel degenerative changes are noted in the lumbar spine. No acute osseous abnormality is noted. IMPRESSION: Multiple rounded low densities are noted in the hepatic parenchyma most consistent with cysts. 4 mm nodule noted in right lower lobe. No follow-up needed if patient is low-risk.This recommendation follows the consensus statement: Guidelines for Management of Incidental Pulmonary Nodules Detected on CT Images: From the Fleischner Society 2017; Radiology 2017; 284:228-243. Small sliding-type hiatal hernia. Coronary artery calcifications are noted suggesting coronary artery disease. Large amount of stool is noted in rectum concerning for impaction. Multilevel degenerative disc disease is noted in the lumbar spine. Aortic Atherosclerosis (ICD10-I70.0). Electronically Signed   By: Lupita Raider M.D.   On: 07/30/2022 14:46   CT Head Wo Contrast  Result Date: 07/30/2022 CLINICAL DATA:  Mental status change, unknown cause. EXAM: CT HEAD WITHOUT CONTRAST TECHNIQUE: Contiguous axial images were obtained from the base of the skull through the vertex without intravenous contrast. RADIATION DOSE REDUCTION: This exam was performed according to the departmental dose-optimization program which includes automated exposure control, adjustment of the mA and/or kV according to patient size and/or use of iterative reconstruction technique. COMPARISON:  Head CT 06/18/2020.  MRI brain 06/22/2020. FINDINGS: Brain: No acute hemorrhage. Unchanged severe chronic small-vessel disease and generalized volume loss. Cortical gray-white differentiation is otherwise preserved. No acute hydrocephalus or extra-axial collection. No mass effect or midline  shift. Vascular: No hyperdense vessel or unexpected calcification. Skull: No calvarial fracture or suspicious bone lesion. Skull base is unremarkable. Sinuses/Orbits: Unremarkable. Other: None. IMPRESSION: 1. No acute intracranial abnormality. 2. Unchanged severe chronic small-vessel disease and generalized volume loss. Electronically Signed   By: Orvan Falconer M.D.   On: 07/30/2022 14:06    Pending Labs Unresulted Labs (From admission, onward)     Start     Ordered   07/30/22 1419  Urine Culture  Once,   R        07/30/22 1419   07/30/22 1234  Blood Culture (routine x 2)  (Undifferentiated presentation (screening labs and basic nursing orders))  BLOOD CULTURE X 2,   STAT      07/30/22 1235            Vitals/Pain Today's Vitals   07/30/22 1400 07/30/22 1430 07/30/22 1500 07/30/22 1545  BP: (!) 158/98 (!) 140/74 (!) 151/80 (!) 141/78  Pulse: 74 86 72 95  Resp: 18 19 14 18   Temp:      TempSrc:      SpO2: 99% 100% 98% 100%    Isolation Precautions No active isolations  Medications Medications  lactated ringers bolus 1,000 mL (0 mLs Intravenous Stopped 07/30/22 1407)    Mobility non-ambulatory     Focused Assessments Neuro Assessment Handoff:  Swallow screen pass? No          Neuro Assessment:   Neuro Checks:      Has TPA been given? No If patient is a Neuro Trauma and patient is going to OR before floor call report to 4N Charge nurse: [phone] or [phone]   R Recommendations: See Admitting Provider Note  Report given to:   Additional Notes: Family at bedside pt usually walks, pt is non- ambulatory. RN gave enema. VSS. Alert but confused.

## 2022-07-30 NOTE — Hospital Course (Addendum)
Mary Walters is a 89 y.o.female with a history of vascular dementia, T2DM, HLD, HTN, CVA who was admitted to the [facility] Medicine Teaching Service at [facility] for failure to thrive in setting of worsening dementia.   Her hospital course is detailed below:  Altered Mental Status  Worsening Dementia  FTT Patient presented to the hospital with concern for steady decline with known vascular dementia.  She had been refusing to eat and mostly bed-bound.  Family presented with the patient hoping to have a goals of care discussion.  Palliative care was consulted to facilitate this. Family expressed that patient had previously given clear instructions on her goals of care, with emphasis on comfort care. Family's goals of care discussion concluded in deciding to take patient home with home hospice. She was discharged home with oxycodone 5 mg tablets to be used as needed for pain.   Hypercalcemia Patient was found to have mildly elevated calcium.  This was unlikely to be due to excessive calcium intake as patient was reported by family to be inconsistent with taking oral medications. Suspicious for hypercalcemia of malignancy, and discussed this with the family. During hospitalization patient did not appear uncomfortable, and mentation did not improve or worsen. Family declined further medical intervention or workup. She will be continued on Colace and Miralax to help reduce constipation.    PCP Follow-up Recommendations: Home hospice referral

## 2022-07-30 NOTE — Assessment & Plan Note (Addendum)
-   Admit to FMTS, attending Dr. Lum Babe  - Med-surg, Vital signs per floor - Palliative care consulted, appreciate recommendations  - Continue GOC conversations  - Soft diet, pending SLP recs - VTE prophylaxis SCDs - Fall precautions - Delirium precautions - 5 mg Oxy solu Q4H PRN for pain overnight

## 2022-07-30 NOTE — ED Provider Notes (Signed)
[place] EMERGENCY DEPARTMENT AT [facility] Provider Note   CSN: [id number] Arrival date & time: 07/30/22  1222     History  Chief Complaint  Patient presents with   Altered Mental Status    Mary Walters is a 89 y.o. female.  HPI Patient presents for generalized weakness and decreased p.o. intake.  Medical history includes CVA, T2DM, HTN, CKD, dementia, HLD.  She arrives via EMS from home.  Family reports that she has stopped eating and drinking over the past 3 to 4 days.  EMS reports that history was very limited on scene.  History from patient is limited by her dementia.  History per daughter-in-law: Patient was ambulatory with one-person assist up until her ED visit 3 weeks ago.  At the time, she was treated for dehydration.  She has been confined to the bed since then.  She has had worsening over the past 4 days.  She has had very little p.o. intake.  She was able to drink a small amount of Ensure.  She has not had any vomiting.  It has been several days since her last bowel movement.  Although she does not complain of pain at rest, she will cry out as if in pain if anywhere on her body is touched.  She is less conversant than she typically is.  Patient is DNR/DNI.    Home Medications Prior to Admission medications   Medication Sig Start Date End Date Taking? Authorizing Provider  acetaminophen (TYLENOL) 325 MG tablet Take 325 mg by mouth every 6 (six) hours as needed for moderate pain.   Yes [provider]  amLODipine (NORVASC) 5 MG tablet TAKE 1 TABLET BY MOUTH EVERYDAY AT BEDTIME Patient taking differently: Take 5 mg by mouth every evening. 12/25/21  Yes Westley Chandler, MD  aspirin EC 81 MG tablet Take 81 mg by mouth at bedtime.   Yes [provider]  atorvastatin (LIPITOR) 40 MG tablet Take 1 tablet (40 mg total) by mouth daily. 12/25/21  Yes Westley Chandler, MD  calcium-vitamin D Ruthell Rummage WITH D) 500-5 MG-MCG tablet Take 1 tablet by mouth  daily with breakfast. 12/25/21  Yes Westley Chandler, MD  candesartan (ATACAND) 8 MG tablet Take 1 tablet (8 mg total) by mouth daily. 12/25/21  Yes Westley Chandler, MD  Cholecalciferol (VITAMIN D) 50 MCG (2000 UT) tablet Take 2,000 Units by mouth daily.   Yes [provider]  dextromethorphan (ROBITUSSIN 12 HOUR COUGH) 30 MG/5ML liquid Take 30 mg by mouth daily as needed for cough.   Yes [provider]  diclofenac Sodium (VOLTAREN) 1 % GEL Apply 4 g topically 4 (four) times daily. Patient taking differently: Apply 4 g topically daily as needed (pain). 12/24/21  Yes Westley Chandler, MD  ketoconazole (NIZORAL) 2 % cream Apply 1 Application topically daily. To feet between toes 04/15/22  Yes Westley Chandler, MD  polyethylene glycol (MIRALAX / GLYCOLAX) 17 g packet Take 17 g by mouth daily as needed for moderate constipation.   Yes [provider]  Blood Glucose Monitoring Suppl (ONETOUCH VERIO FLEX SYSTEM) w/Device KIT Check once daily in the morning before breakfast Patient taking differently: 1 each by Other route daily. 06/11/18   Westley Chandler, MD  glucose blood test strip Use as instructed Patient taking differently: 1 each by Other route See admin instructions. Use as instructed 06/11/18   Westley Chandler, MD  Lancet Devices (ONE TOUCH DELICA LANCING DEV) MISC  1 Device by Does not apply route daily as needed. 06/02/17   Shon Hale, MD  OneTouch Delica Lancets 33G MISC 1 Units by Does not apply route daily as needed. 06/11/18   Westley Chandler, MD      Allergies    Patient has no known allergies.    Review of Systems   Review of Systems  Unable to perform ROS: Dementia    Physical Exam Updated Vital Signs BP (!) 141/78   Pulse 95   Temp 98.4 F (36.9 C) (Rectal)   Resp 18   SpO2 100%  Physical Exam Vitals and nursing note reviewed.  Constitutional:      General: She is not in acute distress.    Appearance: She is well-developed. She is not  toxic-appearing or diaphoretic.  HENT:     Head: Normocephalic and atraumatic.     Right Ear: External ear normal.     Left Ear: External ear normal.     Nose: Nose normal.     Mouth/Throat:     Mouth: Mucous membranes are moist.  Eyes:     Extraocular Movements: Extraocular movements intact.     Conjunctiva/sclera: Conjunctivae normal.  Cardiovascular:     Rate and Rhythm: Normal rate and regular rhythm.     Heart sounds: No murmur heard. Pulmonary:     Effort: Pulmonary effort is normal. No respiratory distress.     Breath sounds: Normal breath sounds. No wheezing, rhonchi or rales.  Chest:     Chest wall: No tenderness.  Abdominal:     General: There is no distension.     Palpations: Abdomen is soft.     Tenderness: There is abdominal tenderness.  Musculoskeletal:        General: No swelling or deformity.     Cervical back: Normal range of motion and neck supple.     Right lower leg: No edema.     Left lower leg: No edema.  Skin:    General: Skin is warm and dry.     Coloration: Skin is not jaundiced or pale.  Neurological:     General: No focal deficit present.     Mental Status: She is alert. She is disoriented.     Comments: Oriented to self only  Psychiatric:        Mood and Affect: Mood normal.        Behavior: Behavior normal.     ED Results / Procedures / Treatments   Labs (all labs ordered are listed, but only abnormal results are displayed) Labs Reviewed  COMPREHENSIVE METABOLIC PANEL - Abnormal; Notable for the following components:      Result Value   Glucose, Bld 129 (*)    BUN 70 (*)    Creatinine, Ser 1.61 (*)    Calcium 12.3 (*)    Total Protein 10.6 (*)    Albumin 3.3 (*)    GFR, Estimated 30 (*)    All other components within normal limits  CBC WITH DIFFERENTIAL/PLATELET - Abnormal; Notable for the following components:   RBC 3.51 (*)    Hemoglobin 9.4 (*)    HCT 31.0 (*)    RDW 18.2 (*)    All other components within normal limits   URINALYSIS, W/ REFLEX TO CULTURE (INFECTION SUSPECTED) - Abnormal; Notable for the following components:   APPearance HAZY (*)    Leukocytes,Ua SMALL (*)    Bacteria, UA MANY (*)    All other components within normal limits  I-STAT CHEM 8, ED - Abnormal; Notable for the following components:   Sodium 146 (*)    Chloride 117 (*)    BUN 66 (*)    Creatinine, Ser 1.70 (*)    Glucose, Bld 141 (*)    Calcium, Ion 1.48 (*)    Hemoglobin 9.9 (*)    HCT 29.0 (*)    All other components within normal limits  TROPONIN I (HIGH SENSITIVITY) - Abnormal; Notable for the following components:   Troponin I (High Sensitivity) 29 (*)    All other components within normal limits  CULTURE, BLOOD (ROUTINE X 2)  CULTURE, BLOOD (ROUTINE X 2)  URINE CULTURE  MAGNESIUM  LACTIC ACID, PLASMA  TSH    EKG EKG Interpretation Date/Time:  Tuesday July 30 2022 12:31:21 EDT Ventricular Rate:  92 PR Interval:  167 QRS Duration:  72 QT Interval:  412 QTC Calculation: 510 R Axis:   -19  Text Interpretation: Sinus rhythm Inferior infarct, old Prolonged QT interval Confirmed by Gloris Manchester [phone]) on 07/30/2022 1:10:34 PM  Radiology DG Chest Port 1 View  Result Date: 07/30/2022 CLINICAL DATA:  Questionable sepsis - evaluate for abnormality EXAM: PORTABLE CHEST 1 VIEW COMPARISON:  CXR 07/04/20 FINDINGS: No pleural effusion. No pneumothorax. Unchanged cardiac and mediastinal contours. No focal airspace opacity. There are prominent bilateral interstitial opacities that could represent pulmonary venous congestion or atypical infection. No radiographically apparent displaced rib fractures. Visualized upper abdomen is unremarkable. Degenerative changes of the bilateral glenohumeral and AC joints. IMPRESSION: There are prominent bilateral interstitial opacities that could represent pulmonary venous congestion or atypical infection. Electronically Signed   By: Lorenza Cambridge M.D.   On: 07/30/2022 14:58   CT CHEST ABDOMEN  PELVIS WO CONTRAST  Result Date: 07/30/2022 CLINICAL DATA:  Unintentional weight loss. EXAM: CT CHEST, ABDOMEN AND PELVIS WITHOUT CONTRAST TECHNIQUE: Multidetector CT imaging of the chest, abdomen and pelvis was performed following the standard protocol without IV contrast. RADIATION DOSE REDUCTION: This exam was performed according to the departmental dose-optimization program which includes automated exposure control, adjustment of the mA and/or kV according to patient size and/or use of iterative reconstruction technique. COMPARISON:  None Available. FINDINGS: CT CHEST FINDINGS Cardiovascular: Atherosclerosis of thoracic aorta without aneurysm formation. Normal cardiac size. No pericardial effusion. Coronary artery calcifications are noted. Mediastinum/Nodes: Small sliding-type hiatal hernia. No adenopathy. Thyroid gland is unremarkable. Lungs/Pleura: No pneumothorax or pleural effusion is noted. Minimal bibasilar subsegmental atelectasis or scarring is noted. 4 mm nodule is noted in right lower lobe best seen on image number 69 of series 5. Musculoskeletal: No chest wall mass or suspicious bone lesions identified. CT ABDOMEN PELVIS FINDINGS Hepatobiliary: No cholelithiasis or biliary dilatation is noted. Multiple rounded low densities are noted in the hepatic parenchyma most consistent with cysts. The largest measures 3.2 cm in posterior segment of right hepatic lobe. Pancreas: Unremarkable. No pancreatic ductal dilatation or surrounding inflammatory changes. Spleen: Normal in size without focal abnormality. Adrenals/Urinary Tract: Adrenal glands appear normal. Bilateral renal cysts are noted for which no further follow-up is required. No hydronephrosis or renal obstruction is noted. Urinary bladder is unremarkable. Stomach/Bowel: Stomach is within normal limits. Appendix appears normal. No evidence of bowel wall thickening, distention, or inflammatory changes. Large amount of stool is noted in the rectum.  Vascular/Lymphatic: Aortic atherosclerosis. No enlarged abdominal or pelvic lymph nodes. Reproductive: Uterus and bilateral adnexa are unremarkable. Other: No abdominal wall hernia or abnormality. No abdominopelvic ascites. Musculoskeletal: Multilevel degenerative changes are noted in the lumbar  spine. No acute osseous abnormality is noted. IMPRESSION: Multiple rounded low densities are noted in the hepatic parenchyma most consistent with cysts. 4 mm nodule noted in right lower lobe. No follow-up needed if patient is low-risk.This recommendation follows the consensus statement: Guidelines for Management of Incidental Pulmonary Nodules Detected on CT Images: From the Fleischner Society 2017; Radiology 2017; 284:228-243. Small sliding-type hiatal hernia. Coronary artery calcifications are noted suggesting coronary artery disease. Large amount of stool is noted in rectum concerning for impaction. Multilevel degenerative disc disease is noted in the lumbar spine. Aortic Atherosclerosis (ICD10-I70.0). Electronically Signed   By: Lupita Raider M.D.   On: 07/30/2022 14:46   CT Head Wo Contrast  Result Date: 07/30/2022 CLINICAL DATA:  Mental status change, unknown cause. EXAM: CT HEAD WITHOUT CONTRAST TECHNIQUE: Contiguous axial images were obtained from the base of the skull through the vertex without intravenous contrast. RADIATION DOSE REDUCTION: This exam was performed according to the departmental dose-optimization program which includes automated exposure control, adjustment of the mA and/or kV according to patient size and/or use of iterative reconstruction technique. COMPARISON:  Head CT 06/18/2020.  MRI brain 06/22/2020. FINDINGS: Brain: No acute hemorrhage. Unchanged severe chronic small-vessel disease and generalized volume loss. Cortical gray-white differentiation is otherwise preserved. No acute hydrocephalus or extra-axial collection. No mass effect or midline shift. Vascular: No hyperdense vessel or  unexpected calcification. Skull: No calvarial fracture or suspicious bone lesion. Skull base is unremarkable. Sinuses/Orbits: Unremarkable. Other: None. IMPRESSION: 1. No acute intracranial abnormality. 2. Unchanged severe chronic small-vessel disease and generalized volume loss. Electronically Signed   By: Orvan Falconer M.D.   On: 07/30/2022 14:06    Procedures Procedures    Medications Ordered in ED Medications  cefTRIAXone (ROCEPHIN) 1 g in sodium chloride 0.9 % 100 mL IVPB (has no administration in time range)  lactated ringers bolus 1,000 mL (0 mLs Intravenous Stopped 07/30/22 1407)    ED Course/ Medical Decision Making/ A&P                             Medical Decision Making Amount and/or Complexity of Data Reviewed Labs: ordered. Radiology: ordered. ECG/medicine tests: ordered.  Risk Decision regarding hospitalization.   This patient presents to the ED for concern of generalized weakness, this involves an extensive number of treatment options, and is a complaint that carries with it a high risk of complications and morbidity.  The differential diagnosis includes deconditioning, dehydration, infection, metabolic derangements, anemia, CVA, progression of neurocognitive disorder   Co morbidities that complicate the patient evaluation  CVA, T2DM, HTN, CKD, dementia, HLD   Additional history obtained:  Additional history obtained from EMS, patient's daughter-in-law External records from outside source obtained and reviewed including EMR   Lab Tests:  I Ordered, and personally interpreted labs.  The pertinent results include: AKI is present.  Azotemia suggest prerenal etiology.  Anemia is consistent with lab work from 3 weeks ago.  No leukocytosis is present.   Imaging Studies ordered:  I ordered imaging studies including chest x-ray, CT of head, chest, abdomen, pelvis I independently visualized and interpreted imaging which showed large amount of rectal stool  without any other acute findings I agree with the radiologist interpretation   Cardiac Monitoring: / EKG:  The patient was maintained on a cardiac monitor.  I personally viewed and interpreted the cardiac monitored which showed an underlying rhythm of: Sinus rhythm  Problem List / ED Course / Critical  interventions / Medication management  Patient presents for generalized weakness and decreased p.o. intake for the past 3 to 4 days.  Initial history is provided by EMS who states that family provided very little information.  Patient is awake and alert on arrival.  She is oriented only to self.  She is moving all extremities.  She does seem to have some lower abdominal tenderness.  Rectal temperature was normal.  Patient had a small amount of firm stool in rectal vault which was disimpacted.  Given her poor p.o. intake, IV fluids were ordered.  Workup was initiated.  Lab work is notable for AKI.  She underwent CT imaging which showed a large rectal stool ball without other acute findings.  Daughter-in-law arrived at bedside and was able to provide further history.  Daughter-in-law confirms the patient is DNR/DNI.  Given her AKI, patient to be admitted.  Urinalysis resulted with evidence of urine infection.  Ceftriaxone was ordered.  Urine cultures are pending.  Patient was admitted for further management. I ordered medication including IV fluids for hydration; ceftriaxone for UTI Reevaluation of the patient after these medicines showed that the patient improved I have reviewed the patients home medicines and have made adjustments as needed   Social Determinants of Health:  Has dementia, lives at home with family        Final Clinical Impression(s) / ED Diagnoses Final diagnoses:  AKI (acute kidney injury) (HCC)  Acute cystitis without hematuria    Rx / DC Orders ED Discharge Orders     None         Gloris Manchester, MD 07/30/22 1619

## 2022-07-31 DIAGNOSIS — R410 Disorientation, unspecified: Secondary | ICD-10-CM | POA: Diagnosis not present

## 2022-07-31 DIAGNOSIS — L89302 Pressure ulcer of unspecified buttock, stage 2: Secondary | ICD-10-CM | POA: Insufficient documentation

## 2022-07-31 DIAGNOSIS — L89152 Pressure ulcer of sacral region, stage 2: Secondary | ICD-10-CM | POA: Insufficient documentation

## 2022-07-31 DIAGNOSIS — R633 Feeding difficulties, unspecified: Secondary | ICD-10-CM | POA: Diagnosis not present

## 2022-07-31 DIAGNOSIS — R54 Age-related physical debility: Secondary | ICD-10-CM | POA: Insufficient documentation

## 2022-07-31 DIAGNOSIS — E860 Dehydration: Secondary | ICD-10-CM | POA: Diagnosis not present

## 2022-07-31 DIAGNOSIS — Z515 Encounter for palliative care: Secondary | ICD-10-CM | POA: Diagnosis not present

## 2022-07-31 DIAGNOSIS — N179 Acute kidney failure, unspecified: Secondary | ICD-10-CM | POA: Diagnosis not present

## 2022-07-31 DIAGNOSIS — R627 Adult failure to thrive: Secondary | ICD-10-CM | POA: Diagnosis not present

## 2022-07-31 DIAGNOSIS — Z7189 Other specified counseling: Secondary | ICD-10-CM | POA: Diagnosis not present

## 2022-07-31 DIAGNOSIS — K5641 Fecal impaction: Secondary | ICD-10-CM | POA: Insufficient documentation

## 2022-07-31 LAB — URINE CULTURE

## 2022-07-31 MED ORDER — ZOLEDRONIC ACID 4 MG/100ML IV SOLN
4.0000 mg | Freq: Once | INTRAVENOUS | Status: AC
  Administered 2022-07-31: 4 mg via INTRAVENOUS
  Filled 2022-07-31 (×2): qty 100

## 2022-07-31 MED ORDER — ZOLEDRONIC ACID 5 MG/100ML IV SOLN: 4.0000 mg | Freq: Once | INTRAVENOUS | Status: DC

## 2022-07-31 MED ORDER — SODIUM CHLORIDE 0.9 % IV BOLUS
500.0000 mL | Freq: Once | INTRAVENOUS | Status: AC
  Administered 2022-07-31: 500 mL via INTRAVENOUS

## 2022-07-31 MED ORDER — OXYCODONE HCL 5 MG PO TABS: 5.0000 mg | ORAL_TABLET | ORAL | Status: DC | PRN

## 2022-07-31 MED ORDER — MILK AND MOLASSES ENEMA
1.0000 | Freq: Once | RECTAL | Status: AC
  Administered 2022-07-31: 240 mL via RECTAL
  Filled 2022-07-31: qty 240

## 2022-07-31 NOTE — Assessment & Plan Note (Signed)
-   Palliative care consulted, appreciate recommendations  - Continue GOC conversations  -Continue soft diet, appreciate SLP recs - Fall precautions - Delirium precautions - 5 mg Oxy solu Q4H PRN for pain overnight

## 2022-07-31 NOTE — Assessment & Plan Note (Signed)
Calcium 12.3 -Zoledronic acid 4 mg

## 2022-07-31 NOTE — Progress Notes (Signed)
Daily Progress Note Intern Pager: [phone]  Patient name: Mary Walters Medical record number: [id number] Date of birth: 1928/11/15 Age: 89 y.o. Gender: female  Primary Care Provider: Fortunato Curling, DO Consultants: Palliative Code Status: DNR  Pt Overview and Major Events to Date:  Admitted to FMTS on 7/16  Assessment and Plan:  Mary Walters is a 89 y.o. female presenting with FTT in the setting of advanced dementia.   Family has been very clear regarding patient's wishes for GOC during this admission.  Palliative care has been consulted.  The combination of hypercalcemia, hyperproteinemia, and hypoalbuminemia does raise the question of plasma cell dyscrasia. However, given patient wishes for palliative treatment and hospice placement, will not pursue further workup at this time.  [facility]     * (Principal) Failure to thrive in adult     - Palliative care consulted, appreciate recommendations  - Continue GOC conversations  -Continue soft diet, appreciate SLP recs - Fall precautions - Delirium precautions - 5 mg Oxy solu Q4H PRN for pain overnight         AKI (acute kidney injury) (HCC)     Creatinine elevated at 1.7.  Baseline appears to be around 1.1.   Admitting team discussed options for management with family and they have  elected to not start IV fluids at this time and continue to let things  progress naturally.  Will continue goals of care discussion with  palliative care.        Altered mental status     Poor feeding     Dehydration     Pressure injury of right buttock, stage 2 (HCC)     Pressure ulcer of coccygeal region, stage 2 (HCC)     Fecal impaction in rectum (HCC)     Noted on CT chest abdomen pelvis 7/16. -Milk and molasses enema -Will also try zoledronic acid 4mg  to decrease elevated calcium as this  may be contributing to constipation        Hypercalcemia     Calcium 12.3 -Zoledronic acid 4 mg        Frailty syndrome  in geriatric patient    FEN/GI: Soft diet, pending SLP Rex PPx: SCDs Dispo: Pending palliative, hospice placement  Subjective:  Patient was seen sitting up comfortably in bed.  She had an empty bottle of Ensure in her hand.  When I asked her about it she said it was delicious.  She denied any discomfort.  Very pleasant.  Objective: Temp:  [98 F (36.7 C)-98.4 F (36.9 C)] 98.4 F (36.9 C) (07/17 0856) Pulse Rate:  [72-110] 97 (07/17 0856) Resp:  [14-24] 16 (07/17 0856) BP: (102-158)/(46-98) 102/80 (07/17 0856) SpO2:  [97 %-100 %] 97 % (07/17 0856) Physical Exam: General: Chronically ill-appearing Cardiovascular: RRR, No murmurs appreciated on exam Respiratory: CTA bilaterally, no increased work of breathing.  On room air. Abdomen: Soft, nondistended Extremities: Moves all extremities equally Psych: Alert, pleasant and cooperative, answers some questions appropriately with few words  Laboratory: Most recent CBC Lab Results  Component Value Date   WBC 6.3 07/30/2022   HGB 9.9 (L) 07/30/2022   HCT 29.0 (L) 07/30/2022   MCV 88.3 07/30/2022   PLT 172 07/30/2022   Most recent BMP    Latest Ref Rng & Units 07/30/2022   12:54 PM  BMP  Glucose 70 - 99 mg/dL 147   BUN 8 - 23 mg/dL 66   Creatinine 8.29 -  1.00 mg/dL 4.09   Sodium 811 - 914 mmol/L 146   Potassium 3.5 - 5.1 mmol/L 4.4   Chloride 98 - 111 mmol/L 117     Cyndia Skeeters, DO 07/31/2022, 12:38 PM  PGY-1, [facility] FPTS Intern pager: [phone], text pages welcome Secure chat group [facility] Teaching Service

## 2022-07-31 NOTE — Assessment & Plan Note (Signed)
Creatinine elevated at 1.7.  Baseline appears to be around 1.1.  Admitting team discussed options for management with family and they have elected to not start IV fluids at this time and continue to let things progress naturally.  Will continue goals of care discussion with palliative care.

## 2022-07-31 NOTE — Evaluation (Signed)
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 No pertinent surgical history. HPI:  89 y.o. female who presented to ED with change in mental status and poor oral intake. Pt with PMHx of DM2, HLD, HTN, CVA, Dementia, CKA. CXR, 07/30/22, "There are prominent bilateral interstitial opacities that could  represent pulmonary venous congestion or atypical infection."    Assessment / Plan / Recommendation  Clinical Impression  Pt seen for clinical swallowing evaluation. Pt presents with a moderate oral dysphagia c/b prolonged/inefficient mastication of solids and oral holding with puree. Pt with reduced labial seal with straw sips due to dentition (loose tooth preventing full labial seal); however, pt able to successfully siphon liquids.  Pt benefited from verbal cues to swallow. Oral phase likely impacted by dentition, generalized oral weakness, and cognitive deficits. Pharyngeal swallow appeared [facility] per clinical assessment. Recommend diet downgrade to puree diet with thin liquids with safe swallowing strategies/aspiration precautions as outlined below. SLP Visit Diagnosis: Dysphagia, oral phase (R13.11)    Aspiration Risk  Mild aspiration risk    Diet Recommendation Dysphagia 1 (Puree);Thin liquid    Liquid Administration via: Spoon;Cup;Straw Medication Administration: Whole meds with puree (vs crushed in puree) Supervision: Staff  to assist with self feeding;Full supervision/cueing for compensatory strategies Compensations: Minimize environmental distractions;Slow rate;Small sips/bites Postural Changes: Seated upright at 90 degrees;Remain upright for at least 30 minutes after po intake    Other  Recommendations Recommended Consults:  (Palliative Care Consult pending) Oral Care Recommendations: Oral care QID;Staff/trained caregiver to provide oral care    Recommendations for follow up therapy are one component of a multi-disciplinary discharge planning process, led by the attending physician.  Recommendations may be updated based on patient status, additional functional criteria and insurance authorization.  Follow up Recommendations Follow physician's recommendations for discharge plan and follow up therapies      Assistance Recommended at Discharge    Functional Status Assessment Patient has had a recent decline in their functional status and demonstrates the ability to make significant improvements in function in a reasonable and predictable amount of time.  Frequency and Duration min 2x/week  2 weeks       Prognosis Prognosis for improved oropharyngeal function: Fair Barriers to Reach Goals: Cognitive deficits;Severity of deficits Barriers/Prognosis Comment: dementia      Swallow Study   General Date of Onset: 07/30/22 HPI: 89 y.o. female who presented to ED with change in mental status and poor oral intake. Pt with PMHx of DM2, HLD, HTN, CVA, Dementia, CKA. CXR, 07/30/22, "There are prominent bilateral interstitial opacities that could  represent pulmonary venous congestion or atypical infection." Type of Study: Bedside Swallow Evaluation Previous Swallow Assessment: most recently seen 07/05/20 with recommendation for a mech soft diet with thin liquids Diet Prior to this Study:  (soft, thin) Temperature Spikes Noted: No Respiratory Status: Room air History of Recent Intubation: No Behavior/Cognition:  Alert;Cooperative;Pleasant mood;Confused Oral Cavity Assessment: Dry Oral Care Completed by SLP: Yes Oral Cavity - Dentition:  Poor condition;Missing dentition Self-Feeding Abilities: Total assist Patient Positioning: Upright in bed Baseline Vocal Quality: Normal Volitional Cough: Cognitively unable to elicit Volitional Swallow: Unable to elicit    Oral/Motor/Sensory Function Overall Oral Motor/Sensory Function: Generalized oral weakness   Ice Chips Ice chips: Not tested   Thin Liquid Thin Liquid: Impaired Oral Phase Impairments: Reduced labial seal (loose tooth interfering with labial seal) Oral Phase Functional Implications:  (however, able to sipon liquids with use of straw) Pharyngeal  Phase Impairments:  (WFL)    Nectar Thick Nectar Thick Liquid: Not tested   Honey Thick Honey Thick Liquid: Not tested   Puree Puree: Impaired Presentation: Spoon Oral Phase Impairments: Poor awareness of bolus Oral Phase Functional Implications: Oral holding;Prolonged oral transit Pharyngeal Phase Impairments:  (WFL)   Solid     Solid: Impaired Oral Phase Impairments: Impaired mastication;Poor awareness of bolus Oral Phase Functional Implications: Impaired mastication;Oral holding;Prolonged oral transit Pharyngeal Phase Impairments:  (WFL)     Clyde Canterbury, M.S., CCC-SLP Speech-Language Pathologist Secure Chat Preferred  O: [phone]  Woodroe Chen 07/31/2022,12:27 PM

## 2022-07-31 NOTE — Consult Note (Addendum)
Palliative Care Consult Note                                  Date: 07/31/2022   Patient Name: Mary Walters  DOB: 12-01-1928  MRN: [id number]  Age / Sex: 89 y.o., female  PCP: Fortunato Curling, DO Referring Physician: McDiarmid, Leighton Roach, MD  Reason for Consultation: Establishing goals of care  HPI/Patient Profile: 89 y.o. female  with past medical history of dementia, CVA, DM 2, HTN, and HLD who presented to the ED on 07/30/2022 with altered mental status and poor oral intake.  She was admitted to the [facility] Medicine service with failure to thrive and AKI.  Patient was seen in the ED a few weeks ago for similar presentation and was treated with IVF and sent home.  She has progressively declined since then.  Palliative Medicine has been consulted for goals of care.   Subjective:   I have reviewed medical records including progress notes, labs and imaging, discussed with Dr. Hyacinth Meeker, and assessed the patient at bedside. She is pleasant and awake. Oriented to self only.   I met with spoke with her daughter-in-law [name] by phone to discuss diagnosis, prognosis, GOC, EOL wishes, disposition, and options.  I introduced Palliative Medicine as specialized medical care for people living with serious illness. It focuses on providing relief from the symptoms and stress of a serious illness.   Patient is well cared for at home by [place] and other family members.  [name] has expressed that it has been increasingly more difficult to care for Mrs. Revelo, due to her advanced dementia and declining functional status.  We discussed patient's current illness and what it means in the larger context of her ongoing co-morbidities. Current clinical status was reviewed. Natural disease trajectory of dementia was discussed.  We discussed that patient's current clinical status is consistent with failure to thrive, which is a syndrome of global decline that  occurs in elderly adults as a worsening of physical frailty accompanied by decreased appetite and PO intake.   Created space and opportunity for patient and family to explore thoughts and feelings regarding current medical situation. Values and goals of care important to patient and family were attempted to be elicited.  [name] confirms that the goal of care is to focus on patient's comfort and quality of life.  Reviewed that comfort care involves allowing a natural course to occur with the goal is comfort and dignity rather than cure/prolonging life.   We discussed the philosophy and benefits of hospice care. Discussed the difference between home hospice versus an inpatient hospice facility.  [name] verbalizes understanding and will discuss these options with the rest of the family.  She is agreeable to a follow-up discussion regarding hospice tomorrow.  Questions and concerns addressed. Family encouraged to call with questions or concerns.     Review of Systems  Unable to perform ROS   Objective:   Primary Diagnoses: Present on Admission:  Failure to thrive in adult  AKI (acute kidney injury) (HCC)  Altered mental status  Poor feeding  Dehydration  Pressure injury of right buttock, stage 2 (HCC)  Pressure ulcer of coccygeal region, stage 2 (HCC)  Fecal impaction in rectum (HCC)  Hypercalcemia  Frailty syndrome in geriatric patient   Physical Exam Vitals reviewed.  Constitutional:      General: She is not in acute distress.    Appearance: She is  ill-appearing.     Comments: Frail, elderly  Pulmonary:     Effort: No respiratory distress.  Neurological:     Comments: Oriented to self only  Psychiatric:        Cognition and Memory: Cognition is impaired. Memory is impaired.     Vital Signs:  BP 136/82 (BP Location: Right Arm)   Pulse 91   Temp 98.2 F (36.8 C) (Oral)   Resp 16   SpO2 98%   Palliative Assessment/Data: PPS 20%     Assessment & Plan:   SUMMARY  OF RECOMMENDATIONS   Continue comfort focused care No further labs No IV fluids Family is considering hospice care  I will follow-up with [name] tomorrow  Primary Decision Maker: Point of contact for the family is daughter-in-law/[name]  Code Status/Advance Care Planning: DNR  Prognosis:  Less then 6 months  Discharge Planning:  To Be Determined    Thank you for allowing [place] to participate in the care of Mary Walters  MDM - High  Signed by: Sherlean Foot, NP Palliative Medicine Team  Team Phone # [phone]  For individual providers, please see AMION

## 2022-07-31 NOTE — Assessment & Plan Note (Signed)
Noted on CT chest abdomen pelvis 7/16. -Milk and molasses enema -Will also try zoledronic acid 4mg  to decrease elevated calcium as this may be contributing to constipation

## 2022-08-01 DIAGNOSIS — E860 Dehydration: Secondary | ICD-10-CM | POA: Diagnosis not present

## 2022-08-01 DIAGNOSIS — N179 Acute kidney failure, unspecified: Secondary | ICD-10-CM | POA: Diagnosis not present

## 2022-08-01 DIAGNOSIS — R54 Age-related physical debility: Secondary | ICD-10-CM | POA: Diagnosis not present

## 2022-08-01 DIAGNOSIS — Z7189 Other specified counseling: Secondary | ICD-10-CM | POA: Diagnosis not present

## 2022-08-01 DIAGNOSIS — R627 Adult failure to thrive: Secondary | ICD-10-CM | POA: Diagnosis not present

## 2022-08-01 DIAGNOSIS — Z515 Encounter for palliative care: Secondary | ICD-10-CM | POA: Diagnosis not present

## 2022-08-01 LAB — BASIC METABOLIC PANEL
Anion gap: 6 (ref 5–15)
BUN: 56 mg/dL — ABNORMAL HIGH (ref 8–23)
CO2: 24 mmol/L (ref 22–32)
Calcium: 11.5 mg/dL — ABNORMAL HIGH (ref 8.9–10.3)
Chloride: 110 mmol/L (ref 98–111)
Creatinine, Ser: 1.28 mg/dL — ABNORMAL HIGH (ref 0.44–1.00)
GFR, Estimated: 39 mL/min — ABNORMAL LOW (ref >60)
Glucose, Bld: 199 mg/dL — ABNORMAL HIGH (ref 70–99)
Potassium: 3.9 mmol/L (ref 3.5–5.1)
Sodium: 140 mmol/L (ref 135–145)

## 2022-08-01 LAB — PTH, INTACT AND CALCIUM
Calcium, Total (PTH): 12.6 mg/dL — ABNORMAL HIGH (ref 8.7–10.3)
PTH: 8 pg/mL — ABNORMAL LOW (ref 15–65)

## 2022-08-01 NOTE — Assessment & Plan Note (Signed)
Creatinine initially 1.70, now improved to 1.28.  Baseline appears to be around 1.1.  Admitting team discussed options for management with family and they elected to not start IV fluids at this time and continue to let things progress naturally.  Will continue goals of care discussion with palliative care.

## 2022-08-01 NOTE — Assessment & Plan Note (Signed)
Noted on CT chest abdomen pelvis 7/16. Milk and molasses enema did produce a BM. Pt also given zoledronic acid 4mg  to decrease elevated calcium as this may be contributing to constipation.

## 2022-08-01 NOTE — Progress Notes (Signed)
Daily Progress Note Intern Pager: [phone]  Patient name: Mary Walters Medical record number: [id number] Date of birth: February 06, 1928 Age: 89 y.o. Gender: female  Primary Care Provider: Fortunato Curling, DO Consultants: Palliative Code Status: DNR   Pt Overview and Major Events to Date:  Admitted to [facility]MTS on 7/16   Assessment and Plan:  Mary Walters is a 89 y.o. female presenting with FTT in the setting of advanced dementia.   Family has been very clear regarding patient's wishes for GOC during this admission.  Palliative care has been consulted.   [facility]     * (Principal) Dehydration     Given IVF bolus.        Failure to thrive in adult     - Palliative care consulted, appreciate recommendations  - Continue GOC conversations  - Continue soft diet, appreciate recs from SLP - Fall precautions - Delirium precautions - 5 mg Oxy solu Q4H PRN for pain overnight         AKI (acute kidney injury) (HCC)     Creatinine initially 1.70, now improved to 1.28.  Baseline appears to  be around 1.1.  Admitting team discussed options for management with  family and they elected to not start IV fluids at this time and continue  to let things progress naturally.  Will continue goals of care discussion  with palliative care.        Altered mental status     Poor feeding     Patient has been able to drink Ensure.  - 1:1 nursing assist with feeding        Pressure injury of right buttock, stage 2 (HCC)     Pressure ulcer of coccygeal region, stage 2 (HCC)     Fecal impaction in rectum (HCC)     Noted on CT chest abdomen pelvis 7/16. Milk and molasses enema did  produce a BM. Pt also given zoledronic acid 4mg  to decrease elevated  calcium as this may be contributing to constipation.        Hypercalcemia     Corrected Calcium 12.1. Previously given Zoledronic acid 4 mg. - Consider Calcitonin for moderate hypercalcemia        Frailty syndrome in geriatric  patient    FEN/GI: Soft diet, pending SLP Rex PPx: SCDs Dispo: Pending palliative, hospice placement  Subjective:  Patient seen this morning lying in bed. She is pleasant and appears comfortable. She does have a tray of food bedside.   Objective: Temp:  [98.2 F (36.8 C)-98.4 F (36.9 C)] 98.4 F (36.9 C) (07/18 0733) Pulse Rate:  [72-91] 73 (07/18 0733) Resp:  [16-18] 18 (07/18 0733) BP: (119-136)/(65-99) 130/99 (07/18 0733) SpO2:  [98 %-100 %] 100 % (07/18 0733) Physical Exam: General: Chronically ill-appearing. Cardiovascular: RRR, No murmurs appreciated on exam. Respiratory: CTA bilaterally, no increased work of breathing.  On room air. Abdomen: Soft, non-distended. Extremities: Moves all extremities equally. Psych: Alert, pleasant and cooperative.   Laboratory: Most recent CBC Lab Results  Component Value Date   WBC 6.3 07/30/2022   HGB 9.9 (L) 07/30/2022   HCT 29.0 (L) 07/30/2022   MCV 88.3 07/30/2022   PLT 172 07/30/2022   Most recent BMP    Latest Ref Rng & Units 08/01/2022   12:55 AM  BMP  Glucose 70 - 99 mg/dL 308   BUN 8 - 23 mg/dL 56   Creatinine 6.57 - 1.00 mg/dL 8.46  Sodium 135 - 145 mmol/L 140   Potassium 3.5 - 5.1 mmol/L 3.9   Chloride 98 - 111 mmol/L 110   CO2 22 - 32 mmol/L 24   Calcium 8.9 - 10.3 mg/dL 03.4    Corrected Ca: 74.2  Cyndia Skeeters, DO 08/01/2022, 9:03 AM  PGY-1, [facility] FPTS Intern pager: [phone], text pages welcome Secure chat group [facility] Teaching Service

## 2022-08-01 NOTE — Plan of Care (Signed)
  Problem: Education: Goal: Knowledge of General Education information will improve Description: Including pain rating scale, medication(s)/side effects and non-pharmacologic comfort measures Outcome: Progressing   Problem: Safety: Goal: Ability to remain free from injury will improve Outcome: Progressing   Problem: Pain Managment: Goal: General experience of comfort will improve Outcome: Progressing   Problem: Skin Integrity: Goal: Risk for impaired skin integrity will decrease Outcome: Progressing

## 2022-08-01 NOTE — Assessment & Plan Note (Addendum)
Patient has been able to drink Ensure.  - 1:1 nursing assist with feeding

## 2022-08-01 NOTE — Assessment & Plan Note (Signed)
Given IVF bolus.

## 2022-08-01 NOTE — Progress Notes (Signed)
Speech Language Pathology Treatment: Dysphagia  Patient Details Name: Mary Walters MRN: [id number] DOB: 15-Feb-1928 Today's Date: 08/01/2022 Time: 4401-0272 SLP Time Calculation (min) (ACUTE ONLY): 10 min  Assessment / Plan / Recommendation Clinical Impression  Patient seen by SLP for skilled treatment focused on dysphagia goals. Patient was awake, alert, cooperative, pleasantly confused. Daughter in room. Lunch tray still in room and patient had consumed 100% of the pureed meats, and 25% of mashed potatoes and pureed carrots. Daughter reports that pureed solids and thin liquids continue to be tolerated by patient. SLP observed patient with straw sips of Ensure shake. Although she draws a lot of liquid into mouth then briefly holds it in mouth before swallowing, there was no overt s/s aspiration, hyolaryngeal elevation appeared [facility] and no oral residuals post swallows. SLP recommending continue on this diet and will s/o at this time.   HPI HPI: 89 y.o. female who presented to ED with change in mental status and poor oral intake. Pt with PMHx of DM2, HLD, HTN, CVA, Dementia, CKA. CXR, 07/30/22, "There are prominent bilateral interstitial opacities that could  represent pulmonary venous congestion or atypical infection."      SLP Plan  All goals met;Discharge SLP treatment due to (comment)      Recommendations for follow up therapy are one component of a multi-disciplinary discharge planning process, led by the attending physician.  Recommendations may be updated based on patient status, additional functional criteria and insurance authorization.    Recommendations  Diet recommendations: Dysphagia 1 (puree);Thin liquid Liquids provided via: Cup;Straw Medication Administration: Whole meds with puree Supervision: Full supervision/cueing for compensatory strategies;Staff to assist with self feeding;Patient able to self feed;Trained caregiver to feed patient Compensations: Minimize  environmental distractions;Slow rate;Small sips/bites Postural Changes and/or Swallow Maneuvers: Seated upright 90 degrees                  Staff/trained caregiver to provide oral care;Oral care BID;Oral care before and after PO     Dysphagia, oral phase (R13.11)     All goals met;Discharge SLP treatment due to (comment)     Angela Nevin, MA, CCC-SLP Speech Therapy

## 2022-08-01 NOTE — Assessment & Plan Note (Signed)
-   Palliative care consulted, appreciate recommendations  - Continue GOC conversations  - Continue soft diet, appreciate recs from SLP - Fall precautions - Delirium precautions - 5 mg Oxy solu Q4H PRN for pain overnight

## 2022-08-01 NOTE — Progress Notes (Signed)
Palliative Medicine Progress Note   Patient Name: Mary Walters       Date: 08/01/2022 DOB: 1928/06/02  Age: 89 y.o. MRN#: [id number] Attending Physician: Billey Co, MD Primary Care Physician: Fortunato Curling, DO Admit Date: 07/30/2022   HPI/Patient Profile: 89 y.o. female  with past medical history of dementia, CVA, DM 2, HTN, and HLD who presented to the ED on 07/30/2022 with altered mental status and poor oral intake.  She was admitted to the [facility] Medicine service with failure to thrive and AKI.   Patient was seen in the ED a few weeks ago for similar presentation and was treated with IVF and sent home.  She has progressively declined since then.   Palliative Medicine has been consulted for goals of care.  Subjective: Chart reviewed. Calcium continues to be elevated at 11.5 today.   Bedside visit. Patient is pleasant, appears comfortable, oriented to self only. Daughter-in-law [name] is in the room. She shares that any medical decisions need to be discussed directly with patient's son (her husband) Gerre Pebbles.   I later spoke with Gerre Pebbles by phone. He reports that he spoke with the attending physician earlier today and was agreeable to treat patient's elevated calcium. He confirms that the goal is for patient to return home under the care of family. He also confirms that family prefers minimal medical interventions.   We reviewed the trajectory of advanced dementia. We discussed that patient's current clinical status is consistent with failure to thrive, which is a syndrome of global decline that occurs in elderly adults as a worsening of physical frailty accompanied by decreased appetite and PO intake.   I discussed with Gerre Pebbles my recommendation to consider the addition of hospice  support at home. I explained that hospice is a Medicare benefit for patients with advanced illness. Discussed that hospice can help with personal care for the patient and provide assistance with symptom management when she further declines. Gerre Pebbles understands and will consider.    Objective:  Physical Exam Vitals reviewed.  Constitutional:      Comments: Chronically ill-appearing  Pulmonary:     Effort: Pulmonary effort is normal.  Neurological:     Mental Status: She is alert.     Comments: Oriented to self  Psychiatric:        Cognition and Memory: Cognition  is impaired.             Vital Signs: BP (!) 130/99 (BP Location: Right Arm)   Pulse 73   Temp 98.4 F (36.9 C) (Oral)   Resp 18   SpO2 100%  SpO2: SpO2: 100 % O2 Device: O2 Device: Room Air O2 Flow Rate:     Palliative Medicine Assessment & Plan   Assessment: Principal Problem:   Dehydration Active Problems:   Failure to thrive in adult   AKI (acute kidney injury) (HCC)   Altered mental status   Poor feeding   Pressure injury of right buttock, stage 2 (HCC)   Pressure ulcer of coccygeal region, stage 2 (HCC)   Fecal impaction in rectum (HCC)   Hypercalcemia   Frailty syndrome in geriatric patient    Recommendations/Plan: Family prefers minimal medical interventions Plan is for patient to return home under the care of family Discussed home hospice with son - he seems open to this and will consider PMT will continue to follow   Code Status: DNR   Prognosis:  < 6 months   Care plan was discussed with Dr. Mliss Sax and Dr. Gita Kudo  Thank you for allowing the Palliative Medicine Team to assist in the care of this patient.   MDM - High   Merry Proud, NP   Please contact Palliative Medicine Team phone at [phone] for questions and concerns.  For individual providers, please see AMION.

## 2022-08-01 NOTE — Assessment & Plan Note (Signed)
Corrected Calcium 12.1. Previously given Zoledronic acid 4 mg. - Consider Calcitonin for moderate hypercalcemia

## 2022-08-01 NOTE — Progress Notes (Signed)
Spoke to patient's son, Lynette Topete, this morning.   We discussed patient's recent labs, specifically her elevated calcium. Discussed that calcium can potentially lead to discomfort and decreased mentation, however patient's calcium is only mildly elevated, and this is not likely a major contributing factor to patient's current state given her progressive vascular dementia. Assured family that patient has appeared very comfortable and pleasant, without distress, on each of my exams.  Discussed possible medications to lower calcium. At this time patient's family declines this intervention.   Family anticipates speaking to Palliative Care today or tomorrow to continue to discuss GOC.

## 2022-08-02 DIAGNOSIS — E860 Dehydration: Secondary | ICD-10-CM | POA: Diagnosis not present

## 2022-08-02 DIAGNOSIS — L8990 Pressure ulcer of unspecified site, unspecified stage: Secondary | ICD-10-CM | POA: Insufficient documentation

## 2022-08-02 LAB — COMPREHENSIVE METABOLIC PANEL
ALT: 13 U/L (ref 0–44)
AST: 23 U/L (ref 15–41)
Albumin: 2.8 g/dL — ABNORMAL LOW (ref 3.5–5.0)
Alkaline Phosphatase: 74 U/L (ref 38–126)
Anion gap: 5 (ref 5–15)
BUN: 52 mg/dL — ABNORMAL HIGH (ref 8–23)
CO2: 25 mmol/L (ref 22–32)
Calcium: 11.1 mg/dL — ABNORMAL HIGH (ref 8.9–10.3)
Chloride: 112 mmol/L — ABNORMAL HIGH (ref 98–111)
Creatinine, Ser: 1.33 mg/dL — ABNORMAL HIGH (ref 0.44–1.00)
GFR, Estimated: 38 mL/min — ABNORMAL LOW (ref >60)
Glucose, Bld: 183 mg/dL — ABNORMAL HIGH (ref 70–99)
Potassium: 4.4 mmol/L (ref 3.5–5.1)
Sodium: 142 mmol/L (ref 135–145)
Total Bilirubin: 0.5 mg/dL (ref 0.3–1.2)
Total Protein: 9.7 g/dL — ABNORMAL HIGH (ref 6.5–8.1)

## 2022-08-02 LAB — CULTURE, BLOOD (ROUTINE X 2)

## 2022-08-02 LAB — VITAMIN D 25 HYDROXY (VIT D DEFICIENCY, FRACTURES): Vit D, 25-Hydroxy: 34.9 ng/mL (ref 30–100)

## 2022-08-02 MED ORDER — DOCUSATE SODIUM 100 MG PO CAPS: 100.0000 mg | ORAL_CAPSULE | Freq: Every day | ORAL | Status: DC

## 2022-08-02 MED ORDER — DOCUSATE SODIUM 100 MG PO CAPS
ORAL_CAPSULE | ORAL | Status: AC
  Filled 2022-08-02: qty 1

## 2022-08-02 MED ORDER — POLYETHYLENE GLYCOL 3350 17 G PO PACK
PACK | ORAL | Status: AC
  Filled 2022-08-02: qty 1

## 2022-08-02 MED ORDER — POLYETHYLENE GLYCOL 3350 17 G PO PACK
17.0000 g | PACK | Freq: Every day | ORAL | Status: DC
  Administered 2022-08-02 – 2022-08-03 (×2): 17 g via ORAL
  Filled 2022-08-02: qty 1

## 2022-08-02 MED ORDER — DOCUSATE SODIUM 50 MG/5ML PO LIQD
ORAL | Status: AC
  Filled 2022-08-02: qty 10

## 2022-08-02 MED ORDER — DOCUSATE SODIUM 50 MG/5ML PO LIQD
100.0000 mg | Freq: Every day | ORAL | Status: DC
  Administered 2022-08-02 – 2022-08-03 (×2): 100 mg via ORAL
  Filled 2022-08-02: qty 10

## 2022-08-02 NOTE — Assessment & Plan Note (Signed)
Corrected Calcium 12.1. Have discussed possible causes with family. At this they do not wish to pursue further work or treatment outside of what has already been done. - Vitamin D level pending

## 2022-08-02 NOTE — TOC Initial Note (Addendum)
Transition of Care [facility]) - Initial/Assessment Note    Patient Details  Name: Mary Walters MRN: [id number] Date of Birth: Apr 25, 1928  Transition of Care [facility]) CM/SW Contact:    Kingsley Plan, RN Phone Number: 08/02/2022, 3:15 PM  Clinical Narrative:                  Call [name] [phone] . Confirmed plan is to go home with hospice. Offered choice over phone. Read medicare.gov ratings to home. He would like  [facility]. Eber Jones with [facility] will call him directly to discuss services .  Gerre Pebbles has NCM direct phone number .   Patient has hospital bed , wheel chair and hoyer lift .  Eber Jones with [facility] and Hospice , talked to son  . He asked them to call him tomorrow at noon before he decides on hospice or not . Secure chatted team   Patient will need ambulance transport home  Expected Discharge Plan: Home w Hospice Care Barriers to Discharge: Continued Medical Work up   Patient Goals and CMS Choice   CMS Medicare.gov Compare Post Acute Care list provided to:: Patient Represenative (must comment) ([name]) Choice offered to / list presented to : Adult Children      Expected Discharge Plan and Services   Discharge Planning Services: CM Consult Post Acute Care Choice: Hospice Living arrangements for the past 2 months: Single Family Home                 DME Arranged: N/A         HH Arranged:  (see note)          Prior Living Arrangements/Services Living arrangements for the past 2 months: Single Family Home Lives with:: Adult Children Patient language and need for interpreter reviewed:: Yes        Need for Family Participation in Patient Care: Yes (Comment) Care giver support system in place?: Yes (comment) Current home services: DME    Activities of Daily Living Home Assistive Devices/Equipment: Walker (specify type) ADL Screening (condition at time of admission) Patient's cognitive ability  adequate to safely complete daily activities?: No Is the patient deaf or have difficulty hearing?: No Does the patient have difficulty seeing, even when wearing glasses/contacts?: Yes Does the patient have difficulty concentrating, remembering, or making decisions?: Yes Patient able to express need for assistance with ADLs?: Yes Does the patient have difficulty dressing or bathing?: Yes Independently performs ADLs?: No Communication: Independent Dressing (OT): Dependent Is this a change from baseline?: Pre-admission baseline Grooming: Dependent Is this a change from baseline?: Pre-admission baseline Feeding: Needs assistance Is this a change from baseline?: Pre-admission baseline Bathing: Dependent Is this a change from baseline?: Pre-admission baseline Toileting: Dependent Is this a change from baseline?: Pre-admission baseline In/Out Bed: Dependent Is this a change from baseline?: Pre-admission baseline Walks in Home: Dependent Is this a change from baseline?: Pre-admission baseline Does the patient have difficulty walking or climbing stairs?: Yes Weakness of Legs: None Weakness of Arms/Hands: None  Permission Sought/Granted                  Emotional Assessment         Alcohol / Substance Use: Not Applicable Psych Involvement: No (comment)  Admission diagnosis:  Failure to thrive in adult [R62.7] Acute cystitis without hematuria [N30.00] AKI (acute kidney injury) (HCC) [N17.9] Patient Active Problem List   Diagnosis Date Noted  Pressure injury of skin 08/02/2022   Dehydration 07/31/2022   Pressure injury of right buttock, stage 2 (HCC) 07/31/2022   Pressure ulcer of coccygeal region, stage 2 (HCC) 07/31/2022   Fecal impaction in rectum (HCC) 07/31/2022   Hypercalcemia 07/31/2022   Frailty syndrome in geriatric patient 07/31/2022   Failure to thrive in adult 07/30/2022   AKI (acute kidney injury) (HCC) 07/30/2022   Altered mental status 07/30/2022   Poor  feeding 07/30/2022   COVID-19 06/22/2020   Vitamin D deficiency 04/10/2020   Alkaline phosphatase elevation 09/04/2018   Diabetic neuropathy (HCC) 08/19/2018   Vascular dementia (HCC) 09/05/2017   Urge incontinence 09/05/2017   Decreased sensation of foot 09/04/2017   CKD (chronic kidney disease), stage III (HCC) 06/02/2017   Stroke (cerebrum) (HCC) 05/14/2017   Type 2 diabetes mellitus with stage 3 chronic kidney disease, without long-term current use of insulin (HCC) 05/14/2017   Hypertension 05/14/2017   PCP:  Fortunato Curling, DO Pharmacy:   [id number] - [place], [place] - [address] [place] [address] Phone: [phone] Fax: [phone]     Social Determinants of Health (SDOH) Social History: SDOH Screenings   Food Insecurity: No Food Insecurity (07/30/2022)  Housing: Patient Declined (07/30/2022)  Transportation Needs: No Transportation Needs (07/30/2022)  Utilities: Not At Risk (07/30/2022)  Depression (PHQ2-9): High Risk (12/24/2021)  Social Connections: Unknown (09/04/2017)  Tobacco Use: Low Risk  (07/30/2022)   SDOH Interventions:     Readmission Risk Interventions     No data to display

## 2022-08-02 NOTE — Assessment & Plan Note (Signed)
-   Palliative care consulted, appreciate recommendations  - Continue GOC conversations  - 5 mg Oxy solu Q4H PRN for pain

## 2022-08-02 NOTE — Discharge Instructions (Signed)
Dear Mary Walters,   Thank you for letting us participate in your care! It was a pleasure to meet you and take care of you.  In the hospital you worked with the Palliative Care team to discuss Goals of Care. Per you and your family's request, you have been referred to [facility] services.  Continue taking Colace and Miralax daily to avoid constipation. I am also sending you home with a few tablets of oxycodone 5 mg as needed for pain control. If you need refills please let the pharmacy know and our office will take care of it.    Thank you for choosing [facility]! Take care and be well!  Family Medicine Teaching Service Inpatient Team [place]  [facility]  [address] [place], [place] [address] [phone]

## 2022-08-02 NOTE — Assessment & Plan Note (Signed)
Patient has been able to drink Ensure.  - 1:1 nursing assist with feeding

## 2022-08-02 NOTE — Assessment & Plan Note (Deleted)
Creatinine initially 1.70, now improved to 1.28.  Baseline appears to be around 1.1.  Admitting team discussed options for management with family and they elected to not start IV fluids at this time and continue to let things progress naturally.  Will continue goals of care discussion with palliative care.

## 2022-08-02 NOTE — Progress Notes (Signed)
     Daily Progress Note Intern Pager: [phone]  Patient name: Mary Walters Medical record number: [id number] Date of birth: [birth year] Age: 89 y.o. Gender: female  Primary Care Provider: Fortunato Curling, DO Consultants: Palliative Code Status: DNR  Pt Overview and Major Events to Date:  Admitted to FMTS on 7/16   Assessment and Plan:  DOREA Walters is a 89 y.o. female presenting with FTT in the setting of advanced dementia.    Family has been very clear regarding patient's wishes for GOC during this admission.  This has been confirmed in further conversation with family throughout patient's hospital stay.  Palliative care has been consulted and also discussed with family.   -      Hospital     Failure to thrive in adult     - Palliative care consulted, appreciate recommendations  - Continue GOC conversations  - 5 mg Oxy solu Q4H PRN for pain        Poor feeding     Patient has been able to drink Ensure.  - 1:1 nursing assist with feeding        Hypercalcemia     Corrected Calcium 12.1. Have discussed possible causes with family. At  this they do not wish to pursue further work or treatment outside of what  has already been done. - Vitamin D level pending       FEN/GI: Soft diet PPx: SCDs Dispo: Pending palliative, hospice placement  Subjective:  Patient seen this morning lying comfortably in bed with the television on.  She is pleasant and smiles to verbal stimulation.  No apparent distress.  Objective: Temp:  [97.6 F (36.4 C)-98.3 F (36.8 C)] 97.6 F (36.4 C) (07/19 0715) Pulse Rate:  [82-108] 83 (07/19 0715) Resp:  [16-18] 18 (07/19 0715) BP: (122-150)/(76-100) 133/76 (07/19 0715) SpO2:  [97 %-100 %] 100 % (07/19 0715) Physical Exam: General: Chronically ill-appearing. Cardiovascular: RRR, No murmurs appreciated on exam. Respiratory: CTA bilaterally, no increased work of breathing.  On room air. Abdomen: Soft, non-distended. Extremities:  Moves all extremities equally. Psych: Alert, pleasant, and cooperative.   Laboratory: Most recent CBC Lab Results  Component Value Date   WBC 6.3 07/30/2022   HGB 9.9 (L) 07/30/2022   HCT 29.0 (L) 07/30/2022   MCV 88.3 07/30/2022   PLT 172 07/30/2022   Most recent BMP    Latest Ref Rng & Units 08/01/2022   11:44 PM  BMP  Glucose 70 - 99 mg/dL 147   BUN 8 - 23 mg/dL 52   Creatinine 8.29 - 1.00 mg/dL 5.62   Sodium 130 - 865 mmol/L 142   Potassium 3.5 - 5.1 mmol/L 4.4   Chloride 98 - 111 mmol/L 112   CO2 22 - 32 mmol/L 25   Calcium 8.9 - 10.3 mg/dL 78.4     Mary Skeeters, DO 08/02/2022, 3:05 PM  PGY-1, [facility] Family Medicine FPTS Intern pager: [phone], text pages welcome Secure chat group [facility] Teaching Service

## 2022-08-03 DIAGNOSIS — R627 Adult failure to thrive: Secondary | ICD-10-CM | POA: Diagnosis not present

## 2022-08-03 DIAGNOSIS — Z7401 Bed confinement status: Secondary | ICD-10-CM | POA: Diagnosis not present

## 2022-08-03 DIAGNOSIS — R404 Transient alteration of awareness: Secondary | ICD-10-CM | POA: Diagnosis not present

## 2022-08-03 DIAGNOSIS — Z743 Need for continuous supervision: Secondary | ICD-10-CM | POA: Diagnosis not present

## 2022-08-03 MED ORDER — DOCUSATE SODIUM 50 MG/5ML PO LIQD: 100.0000 mg | Freq: Every day | ORAL | 0 refills | Status: DC

## 2022-08-03 MED ORDER — OXYCODONE HCL 5 MG PO TABS: 5.0000 mg | ORAL_TABLET | ORAL | 0 refills | Status: DC | PRN

## 2022-08-03 MED ORDER — POLYETHYLENE GLYCOL 3350 17 G PO PACK: 17.0000 g | PACK | Freq: Every day | ORAL | 0 refills | Status: DC

## 2022-08-03 NOTE — Progress Notes (Signed)
     Daily Progress Note Intern Pager: [phone]  Patient name: Mary Walters Medical record number: [id number] Date of birth: Mar 13, 1928 Age: 89 y.o. Gender: female  Primary Care Provider: Fortunato Curling, DO Consultants: Palliative Care  Code Status: DNR/DNI, discussed with family  Pt Overview and Major Events to Date:  7/16: Admitted to FMTS 7/19: Transitioned to comfort care   Assessment and Plan:  Mary Walters is a 89 y.o. female presenting with FTT in the setting of advanced vascular dementia and possible malignancy.   Had GOC discussion with son, Junie Panning, yesterday - family is planning to transition to hospice at home and would like for her to be comfort care. Discussed possible workup for hypercalcemia (most likely in the setting of malignancy) and family politely declined and restated their goals are to keep her comfortable. TOC to help with hospice at home - will anticipate discharge once home DME has been delivered.   Will touch base with Garret this morning to discuss home hospice again in hopes to expedite her returning home as family wishes.   [facility]     * (Principal) Failure to thrive in adult     - Palliative care consulted, appreciate recommendations  - 5 mg Oxy solu Q4H PRN for pain - dysphagia 1 diet, PO as tolerated         Hypercalcemia     Most likely in the setting of malignancy. Per family will not pursue  workup. Goal to keep her comfortable and reduce constipation.  - Miralax daily  - Colace daily        FEN/GI: Dysphagia 1  PPx: SCDs discontinued per comfort care  Dispo: home with home hospice  tomorrow. Barriers include home DME and family to reach out for coordination.   Subjective:  NAEO, conversational with social smiles.   Objective: Temp:  [98.1 F (36.7 C)-98.2 F (36.8 C)] 98.2 F (36.8 C) (07/20 0833) Pulse Rate:  [80-96] 80 (07/20 0833) Resp:  [15-16] 15 (07/20 0833) BP: (86-121)/(63-84) 86/63 (07/20  0833) SpO2:  [96 %-97 %] 96 % (07/20 7846) Physical Exam: General: no acute distress  Cardiovascular: RRR Respiratory: clear, no wheezing Abdomen: non tender  Extremities: no extremity swelling   Laboratory: Most recent CBC Lab Results  Component Value Date   WBC 6.3 07/30/2022   HGB 9.9 (L) 07/30/2022   HCT 29.0 (L) 07/30/2022   MCV 88.3 07/30/2022   PLT 172 07/30/2022   Most recent BMP    Latest Ref Rng & Units 08/01/2022   11:44 PM  BMP  Glucose 70 - 99 mg/dL 962   BUN 8 - 23 mg/dL 52   Creatinine 9.52 - 1.00 mg/dL 8.41   Sodium 324 - 401 mmol/L 142   Potassium 3.5 - 5.1 mmol/L 4.4   Chloride 98 - 111 mmol/L 112   CO2 22 - 32 mmol/L 25   Calcium 8.9 - 10.3 mg/dL 02.7    Glendale Chard, DO 08/03/2022, 8:56 AM  PGY-2, [place] [facility] FPTS Intern pager: [phone], text pages welcome Secure chat group [facility] Teaching Service

## 2022-08-03 NOTE — TOC Progression Note (Addendum)
Transition of Care [facility]) - Progression Note    Patient Details  Name: Mary Walters MRN: [id number] Date of Birth: 06-28-1928  Transition of Care [facility]) CM/SW Contact  Ronny Bacon, RN Phone Number: 08/03/2022, 2:32 PM  Clinical Narrative:   Attempt made to contact son to discuss his decision for plan of care for his mom, voicemail left.  55- Son returned voicemail, reports that he decided to go with [facility] hospice service. Son confirms he has hospital bed, W/C, and hoyer lift at home. Patient will need PTAR transport home.    Expected Discharge Plan: Home w Hospice Care Barriers to Discharge: Continued Medical Work up  Expected Discharge Plan and Services   Discharge Planning Services: CM Consult Post Acute Care Choice: Hospice Living arrangements for the past 2 months: Single Family Home                 DME Arranged: N/A         HH Arranged:  (see note)           Social Determinants of Health (SDOH) Interventions SDOH Screenings   Food Insecurity: No Food Insecurity (07/30/2022)  Housing: Patient Declined (07/30/2022)  Transportation Needs: No Transportation Needs (07/30/2022)  Utilities: Not At Risk (07/30/2022)  Depression (PHQ2-9): High Risk (12/24/2021)  Social Connections: Unknown (09/04/2017)  Tobacco Use: Low Risk  (07/30/2022)    Readmission Risk Interventions     No data to display

## 2022-08-03 NOTE — TOC Transition Note (Signed)
Transition of Care [facility]) - CM/SW Discharge Note   Patient Details  Name: Mary Walters MRN: [id number] Date of Birth: [birth year]  Transition of Care [facility]) CM/SW Contact:  Ronny Bacon, RN Phone Number: 08/03/2022, 4:05 PM   Clinical Narrative: Patient being discharged home with hospice through [facility] home health and hospice. Son confirms has medical bed, hoyer lift and WC. PTAR arranged for transportation home, son is at home to receive patient upon arrival. PTAR forms completed and given to floor RN with instructions to add DNR form.      Final next level of care: Home w Hospice Care Barriers to Discharge: No Barriers Identified   Patient Goals and CMS Choice CMS Medicare.gov Compare Post Acute Care list provided to:: Patient Represenative (must comment) ([name]) Choice offered to / list presented to : Adult Children  Discharge Placement                         Discharge Plan and Services Additional resources added to the After Visit Summary for     Discharge Planning Services: CM Consult Post Acute Care Choice: Hospice          DME Arranged: N/A         HH Arranged:  (see note)          Social Determinants of Health (SDOH) Interventions SDOH Screenings   Food Insecurity: No Food Insecurity (07/30/2022)  Housing: Patient Declined (07/30/2022)  Transportation Needs: No Transportation Needs (07/30/2022)  Utilities: Not At Risk (07/30/2022)  Depression (PHQ2-9): High Risk (12/24/2021)  Social Connections: Unknown (09/04/2017)  Tobacco Use: Low Risk  (07/30/2022)     Readmission Risk Interventions     No data to display

## 2022-08-03 NOTE — Assessment & Plan Note (Signed)
Most likely in the setting of malignancy. Per family will not pursue workup. Goal to keep her comfortable and reduce constipation.  - Miralax daily  - Colace daily

## 2022-08-03 NOTE — Plan of Care (Signed)
Adequate for discharge.

## 2022-08-03 NOTE — Discharge Summary (Signed)
Family Medicine Teaching [facility] Discharge Summary  Patient name: Mary Walters Medical record number: [id number] Date of birth: 02/28/1928 Age: 89 y.o. Gender: female Date of Admission: 07/30/2022  Date of Discharge: 08/03/22  Admitting Physician: Glendale Chard, DO  Primary Care Provider: Fortunato Curling, DO Consultants: Palliative Care   Indication for Hospitalization: Failure to [facility] Course:  Mary Walters is a 89 y.o.female with a history of vascular dementia, T2DM, HLD, HTN, CVA who was admitted to the [facility] Medicine Teaching Service at [facility] for failure to thrive in setting of worsening dementia.   Her hospital course is detailed below:  Altered Mental Status  Worsening Dementia  FTT Patient presented to the hospital with concern for steady decline with known vascular dementia.  She had been refusing to eat and mostly bed-bound.  Family presented with the patient hoping to have a goals of care discussion.  Palliative care was consulted to facilitate this. Family expressed that patient had previously given clear instructions on her goals of care, with emphasis on comfort care. Family's goals of care discussion concluded in deciding to take patient home with home hospice. She was discharged home with oxycodone 5 mg tablets to be used as needed for pain.   Hypercalcemia Patient was found to have mildly elevated calcium.  This was unlikely to be due to excessive calcium intake as patient was reported by family to be inconsistent with taking oral medications. Suspicious for hypercalcemia of malignancy, and discussed this with the family. During hospitalization patient did not appear uncomfortable, and mentation did not improve or worsen. Family declined further medical intervention or workup. She will be continued on Colace and Miralax to help reduce constipation.    PCP Follow-up Recommendations: Home hospice referral   Discharge Diagnoses/Problem  List:  Principal Problem:   Failure to thrive in adult Active Problems:   Hypercalcemia   Disposition: home with home hospice   Discharge Condition: stable   Discharge Exam:  General: no acute distress  Cardiovascular: RRR Respiratory: clear, no wheezing Abdomen: non tender  Extremities: no extremity swelling   Significant Procedures: none   Significant Labs and Imaging:  No results for input(s): "WBC", "HGB", "HCT", "PLT" in the last 48 hours. Recent Labs  Lab 08/01/22 2344  NA 142  K 4.4  CL 112*  CO2 25  GLUCOSE 183*  BUN 52*  CREATININE 1.33*  CALCIUM 11.1*  ALKPHOS 74  AST 23  ALT 13  ALBUMIN 2.8*    Results/Tests Pending at Time of Discharge: none   Discharge Medications:  Allergies as of 08/03/2022   No Known Allergies      Medication List     STOP taking these medications    amLODipine 5 MG tablet Commonly known as: NORVASC   aspirin EC 81 MG tablet   atorvastatin 40 MG tablet Commonly known as: LIPITOR   calcium-vitamin D 500-5 MG-MCG tablet Commonly known as: OSCAL WITH D   candesartan 8 MG tablet Commonly known as: ATACAND   diclofenac Sodium 1 % Gel Commonly known as: Voltaren   glucose blood test strip   ketoconazole 2 % cream Commonly known as: NIZORAL   ONE TOUCH DELICA LANCING DEV Misc   OneTouch Delica Lancets 33G Misc   OneTouch Verio Flex System w/Device Kit   Robitussin 12 Hour Cough 30 MG/5ML liquid Generic drug: dextromethorphan   Vitamin D 50 MCG (2000 UT) tablet       TAKE these medications  acetaminophen 325 MG tablet Commonly known as: TYLENOL Take 325 mg by mouth every 6 (six) hours as needed for moderate pain.   docusate 50 MG/5ML liquid Commonly known as: COLACE Take 10 mLs (100 mg total) by mouth daily. Start taking on: August 04, 2022   oxyCODONE 5 MG immediate release tablet Commonly known as: Oxy IR/ROXICODONE Take 1 tablet (5 mg total) by mouth every 4 (four) hours as needed for  moderate pain, severe pain or breakthrough pain.   polyethylene glycol 17 g packet Commonly known as: MIRALAX / GLYCOLAX Take 17 g by mouth daily. Start taking on: August 04, 2022 What changed:  when to take this reasons to take this        Discharge Instructions: Please refer to Patient Instructions section of EMR for full details.  Patient was counseled important signs and symptoms that should prompt return to medical care, changes in medications, dietary instructions, activity restrictions, and follow up appointments.   Follow-Up Appointments:  Follow-up Information     [facility] Follow up.   Contact information: [address] [place] [address] [phone]                 Glendale Chard, DO 08/03/2022, 3:40 PM PGY-2, [place] [facility]

## 2022-08-03 NOTE — Assessment & Plan Note (Addendum)
-   Palliative care consulted, appreciate recommendations  - 5 mg Oxy solu Q4H PRN for pain - dysphagia 1 diet, PO as tolerated

## 2022-08-03 NOTE — Plan of Care (Signed)
Comfort measures

## 2022-08-04 LAB — CULTURE, BLOOD (ROUTINE X 2)
Culture: NO GROWTH
Culture: NO GROWTH

## 2022-08-05 ENCOUNTER — Other Ambulatory Visit: Payer: Self-pay | Admitting: Student

## 2022-08-05 ENCOUNTER — Other Ambulatory Visit: Payer: Self-pay | Admitting: Family Medicine

## 2022-08-05 NOTE — TOC CM/SW Note (Addendum)
Carolyn with [facility] called to follow up . Per chart son agreed to home with hospice with [facility].   Eber Jones with [facility] spoke to son on Saturday, at that point he told Eber Jones he was undecided.   Eber Jones will call son today   1130 Eber Jones with [facility] called back, she spoke with Gerre Pebbles , he still wants to talk to his family before committing to hospice. He states he has not yet agreed to hospice for his mother.

## 2022-08-06 DIAGNOSIS — I10 Essential (primary) hypertension: Secondary | ICD-10-CM | POA: Diagnosis not present

## 2022-08-06 DIAGNOSIS — F0150 Vascular dementia without behavioral disturbance: Secondary | ICD-10-CM | POA: Diagnosis not present

## 2022-08-06 DIAGNOSIS — N183 Chronic kidney disease, stage 3 unspecified: Secondary | ICD-10-CM | POA: Diagnosis not present

## 2022-08-06 DIAGNOSIS — N3946 Mixed incontinence: Secondary | ICD-10-CM | POA: Diagnosis not present

## 2022-08-06 DIAGNOSIS — I69318 Other symptoms and signs involving cognitive functions following cerebral infarction: Secondary | ICD-10-CM | POA: Diagnosis not present

## 2022-08-06 DIAGNOSIS — Z8616 Personal history of COVID-19: Secondary | ICD-10-CM | POA: Diagnosis not present

## 2022-08-06 DIAGNOSIS — E559 Vitamin D deficiency, unspecified: Secondary | ICD-10-CM | POA: Diagnosis not present

## 2022-08-06 DIAGNOSIS — E1122 Type 2 diabetes mellitus with diabetic chronic kidney disease: Secondary | ICD-10-CM | POA: Diagnosis not present

## 2022-08-06 DIAGNOSIS — E1140 Type 2 diabetes mellitus with diabetic neuropathy, unspecified: Secondary | ICD-10-CM | POA: Diagnosis not present

## 2022-08-06 DIAGNOSIS — Z7982 Long term (current) use of aspirin: Secondary | ICD-10-CM | POA: Diagnosis not present

## 2022-08-18 NOTE — Progress Notes (Deleted)
Subjective:   Mary Walters is a 89 y.o. female who presents for an Initial Medicare Annual Wellness Visit.  Visit Complete: {VISITMETHOD:[phone]}  Patient Medicare AWV questionnaire was completed by the patient on ***; I have confirmed that all information answered by patient is correct and no changes since this date.  Review of Systems    ***       Objective:    There were no vitals filed for this visit. There is no height or weight on file to calculate BMI.     08/03/2022    6:39 PM 12/24/2021   10:12 AM 07/04/2020   11:47 AM 04/10/2020    1:28 PM 02/18/2019    1:57 PM 04/11/2018    2:04 PM 09/04/2017    1:44 PM  Advanced Directives  Does Patient Have a Medical Advance Directive? Yes Yes Unable to assess, patient is non-responsive or altered mental status No No No No  Type of Advance Directive Out of facility DNR (pink MOST or yellow form) Healthcare Power of [place];Living will       Does patient want to make changes to medical advance directive? No - Guardian declined        Copy of Healthcare Power of Attorney in Chart?  No - copy requested       Would patient like information on creating a medical advance directive? No - Guardian declined   No - Patient declined No - Patient declined No - Patient declined Yes (MAU/Ambulatory/Procedural Areas - Information given)    Current Medications (verified) Outpatient Encounter Medications as of 08/19/2022  Medication Sig   acetaminophen (TYLENOL) 325 MG tablet Take 325 mg by mouth every 6 (six) hours as needed for moderate pain.   oxyCODONE (OXY IR/ROXICODONE) 5 MG immediate release tablet Take 1 tablet (5 mg total) by mouth every 4 (four) hours as needed for moderate pain, severe pain or breakthrough pain.   polyethylene glycol (MIRALAX / GLYCOLAX) 17 g packet Take 17 g by mouth daily.   senna (SENOKOT) 8.6 MG TABS tablet Take 1 tablet (8.6 mg total) by mouth daily.   No facility-administered encounter medications on file as  of 08/19/2022.    Allergies (verified) Patient has no known allergies.   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 Family History  Problem Relation Age of Onset   Hypertension Mother    Social History   Socioeconomic History   Marital status: Widowed    Spouse name: Not on file   Number of children: Not on file   Years of education: Not on file   Highest education level: Not on file  Occupational History   Occupation: radiology tech    Comment: retired  Tobacco Use   Smoking status: Never   Smokeless tobacco: Never  Vaping Use   Vaping status: Never Used  Substance and Sexual Activity   Alcohol use: Yes    Alcohol/week: 2.0 standard drinks of alcohol    Types: 1 Glasses of wine, 1 Shots of liquor per week    Comment: Patient occasionally drinks wine.  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   Social Determinants of Health   Financial Resource Strain: Not on file  Food Insecurity: No Food Insecurity (07/30/2022)   Hunger Vital Sign    Worried About Running Out of Food in the Last Year: Never true    Ran Out of Food in the Last Year: Never true  Transportation Needs: No Transportation Needs (07/30/2022)   PRAPARE - Administrator, Civil Service (Medical): No    Lack of Transportation (Non-Medical): No  Physical Activity: Not on file  Stress: Not on file  Social Connections: Unknown (09/04/2017)   Social Connection  and Isolation Panel [NHANES]    Frequency of Communication with Friends and Family: Not on file    Frequency of Social Gatherings with Friends and Family: More than three times a week    Attends Religious Services: Not on file    Active Member of Clubs or Organizations: Not on file    Attends Banker Meetings: Not on file    Marital Status: Not on file    Tobacco Counseling Counseling given: Not Answered   Clinical Intake:                        Activities of Daily Living    08/03/2022    6:39 PM 07/30/2022    5:00 PM  In your present state of health, do you have any difficulty performing the following activities:  Hearing?  0  Vision?  1  Difficulty concentrating or making decisions?  1  Walking or climbing stairs?  1  Dressing or bathing?  1  Doing errands, shopping? 1     Patient Care Team: Fortunato Curling, DO as PCP - General Chanetta Marshall Meridee Score, MD (Family Medicine)  Indicate any recent Medical Services you may have received from other than [facility] providers in the past year (date may be approximate).     Assessment:   This is a routine wellness examination for Mary Walters.  Hearing/Vision screen No results found.  Dietary issues and exercise activities discussed:     Goals Addressed   None    Depression Screen    12/24/2021   10:12 AM 04/10/2020    1:40 PM 04/10/2020    1:28 PM 06/23/2018    1:56 PM 09/04/2017    1:43 PM 08/06/2017    9:59 AM 06/02/2017   10:25 AM  PHQ 2/9 Scores  PHQ - 2 Score 5 2 2  0 0 0 0  PHQ- 9 Score 19 4 4         Fall Risk    12/24/2021   10:13 AM 04/10/2020    1:28 PM 06/23/2018    1:56 PM 12/02/2017    2:01 PM 09/04/2017    1:43 PM  Fall Risk   Falls in the past year? 0 0 1 1 No  Number falls in past yr: 0 0 1 1   Injury with Fall? 0 0 0 0   Risk for fall due to : History of fall(s)      Follow up Falls evaluation completed;Falls prevention discussed  Follow up appointment    Comment   md informed       MEDICARE RISK AT HOME:   TIMED UP AND GO:  Was the test performed? No  Cognitive Function:        Immunizations Immunization History  Administered Date(s) Administered   COVID-19, mRNA, vaccine(Comirnaty)12 years and older 12/24/2021   Fluad Quad(high Dose 65+) 12/24/2021   Pneumococcal Conjugate-13 06/02/2017   Tdap 06/02/2017   Zoster Recombinant(Shingrix) 09/05/2017    TDAP status: Up to date  Flu Vaccine status: Due, Education has been provided regarding the importance of this vaccine. Advised may receive this vaccine at local pharmacy or Health Dept. Aware to provide a copy of the vaccination record if obtained from local pharmacy or Health Dept. Verbalized acceptance and understanding.  Pneumococcal vaccine status: Due, Education has been provided regarding the importance of this vaccine. Advised may receive this vaccine at local pharmacy or Health Dept. Aware to provide a copy of the vaccination record if obtained from local pharmacy or Health Dept. Verbalized acceptance and understanding.  Covid-19 vaccine status: Information provided on how to obtain vaccines.   Qualifies for Shingles Vaccine? Yes   Zostavax completed No   Shingrix Completed?: No.    Education has been provided regarding the importance of this vaccine. Patient has been advised to call insurance company to determine out of pocket expense if they have not yet received this vaccine. Advised may also receive vaccine at local pharmacy or Health Dept. Verbalized acceptance and understanding.  Screening Tests Health Maintenance  Topic Date Due   Medicare Annual Wellness (AWV)  Never done   OPHTHALMOLOGY EXAM  Never done   DEXA SCAN  Never done   Zoster Vaccines- Shingrix (2 of 2) 10/31/2017   Pneumonia Vaccine 15+ Years old (2 of 2 - PPSV23 or PCV20) 06/03/2018   HEMOGLOBIN A1C  10/11/2020   FOOT EXAM  04/11/2022   COVID-19 Vaccine (2 - 2023-24 season) 04/25/2022   INFLUENZA VACCINE  08/15/2022    DTaP/Tdap/Td (2 - Td or Tdap) 06/03/2027   HPV VACCINES  Aged Out    Health Maintenance  Health Maintenance Due  Topic Date Due   Medicare Annual Wellness (AWV)  Never done   OPHTHALMOLOGY EXAM  Never done   DEXA SCAN  Never done   Zoster Vaccines- Shingrix (2 of 2) 10/31/2017   Pneumonia Vaccine 47+ Years old (2 of 2 - PPSV23 or PCV20) 06/03/2018   HEMOGLOBIN A1C  10/11/2020   FOOT EXAM  04/11/2022   COVID-19 Vaccine (2 - 2023-24 season) 04/25/2022   INFLUENZA VACCINE  08/15/2022    Colorectal cancer screening: No longer required.   Mammogram status: No longer required due to age and preference .  {Bone Density status:[id number]}  Lung Cancer Screening: (Low Dose CT Chest recommended if Age 24-80 years, 20 pack-year currently smoking OR have quit w/in 15years.) does not qualify.   Lung Cancer Screening Referral: n/a  Additional Screening:  Hepatitis C Screening: does not qualify  Vision Screening: Recommended annual ophthalmology exams for early detection of glaucoma and other disorders of the eye. Is the patient up to date with their annual eye exam?  {YES/NO:21197} Who is the provider or what is the name of the office in which the patient attends annual eye exams? *** If pt is not established with a provider, would they like to be referred to a provider to establish care? {YES/NO:21197}.   Dental Screening: Recommended annual dental exams for proper oral hygiene  Diabetic Foot Exam: Diabetic Foot Exam: Overdue, Pt has been advised about the importance in completing this exam. Pt is scheduled for diabetic foot exam on at next office visit .  Community  Resource Referral / Chronic Care Management: CRR required this visit?  {YES/NO:21197}  CCM required this visit?  {CCM Required choices:[id number]}     Plan:     I have personally reviewed and noted the following in the patient's chart:   Medical and social history Use of alcohol, tobacco or illicit drugs   Current medications and supplements including opioid prescriptions. {Opioid [phone]} Functional ability and status Nutritional status Physical activity Advanced directives List of other physicians Hospitalizations, surgeries, and ER visits in previous 12 months Vitals Screenings to include cognitive, depression, and falls Referrals and appointments  In addition, I have reviewed and discussed with patient certain preventive protocols, quality metrics, and best practice recommendations. A written personalized care plan for preventive services as well as general preventive health recommendations were provided to patient.     Kandis Fantasia Metcalfe, [place]   08/18/1322   After Visit Summary: {CHL AMB AWV After Visit Summary:[phone]}  Nurse Notes: ***

## 2022-08-19 ENCOUNTER — Ambulatory Visit: Payer: Medicare HMO

## 2022-08-27 ENCOUNTER — Telehealth: Payer: Self-pay

## 2022-08-27 NOTE — Telephone Encounter (Signed)
LVM for patient to call back [phone], or to call PCP office to schedule follow up apt. AS, CMA  

## 2022-09-15 DEATH — deceased
